# Patient Record
Sex: Female | Born: 1990 | Race: White | Hispanic: No | Marital: Married | Smoking: Never smoker
Health system: Southern US, Community
[De-identification: ages and names within clinical notes are randomized; demographics above are authoritative.]

## PROBLEM LIST (undated history)

## (undated) ENCOUNTER — Inpatient Hospital Stay (HOSPITAL_COMMUNITY): Payer: Self-pay

## (undated) DIAGNOSIS — K5900 Constipation, unspecified: Secondary | ICD-10-CM

## (undated) DIAGNOSIS — Z872 Personal history of diseases of the skin and subcutaneous tissue: Secondary | ICD-10-CM

## (undated) DIAGNOSIS — I959 Hypotension, unspecified: Secondary | ICD-10-CM

## (undated) DIAGNOSIS — R51 Headache: Secondary | ICD-10-CM

## (undated) DIAGNOSIS — K219 Gastro-esophageal reflux disease without esophagitis: Secondary | ICD-10-CM

## (undated) DIAGNOSIS — E282 Polycystic ovarian syndrome: Secondary | ICD-10-CM

## (undated) DIAGNOSIS — Z8619 Personal history of other infectious and parasitic diseases: Secondary | ICD-10-CM

## (undated) DIAGNOSIS — D649 Anemia, unspecified: Secondary | ICD-10-CM

## (undated) DIAGNOSIS — K859 Acute pancreatitis without necrosis or infection, unspecified: Secondary | ICD-10-CM

## (undated) DIAGNOSIS — J189 Pneumonia, unspecified organism: Secondary | ICD-10-CM

## (undated) DIAGNOSIS — Z302 Encounter for sterilization: Secondary | ICD-10-CM

## (undated) DIAGNOSIS — Z9889 Other specified postprocedural states: Secondary | ICD-10-CM

## (undated) DIAGNOSIS — J45909 Unspecified asthma, uncomplicated: Secondary | ICD-10-CM

## (undated) DIAGNOSIS — T4145XA Adverse effect of unspecified anesthetic, initial encounter: Secondary | ICD-10-CM

## (undated) DIAGNOSIS — R112 Nausea with vomiting, unspecified: Secondary | ICD-10-CM

## (undated) DIAGNOSIS — T8859XA Other complications of anesthesia, initial encounter: Secondary | ICD-10-CM

## (undated) HISTORY — DX: Personal history of other infectious and parasitic diseases: Z86.19

## (undated) HISTORY — PX: WISDOM TOOTH EXTRACTION: SHX21

## (undated) HISTORY — DX: Constipation, unspecified: K59.00

---
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
## 2012-03-09 LAB — OB RESULTS CONSOLE ABO/RH: RH Type: POSITIVE

## 2012-03-09 LAB — OB RESULTS CONSOLE GC/CHLAMYDIA: Gonorrhea: NEGATIVE

## 2012-03-09 LAB — OB RESULTS CONSOLE HIV ANTIBODY (ROUTINE TESTING): HIV: NONREACTIVE

## 2012-05-04 LAB — OB RESULTS CONSOLE GC/CHLAMYDIA
Chlamydia: NEGATIVE
Gonorrhea: NEGATIVE

## 2012-08-21 ENCOUNTER — Encounter (HOSPITAL_COMMUNITY): Payer: Self-pay

## 2012-08-21 ENCOUNTER — Inpatient Hospital Stay (HOSPITAL_COMMUNITY)
Admission: AD | Admit: 2012-08-21 | Discharge: 2012-08-21 | Disposition: A | Discharge status: Home / Self Care | Payer: BC Managed Care – PPO | Source: Ambulatory Visit | Attending: Obstetrics and Gynecology | Admitting: Obstetrics and Gynecology

## 2012-08-21 DIAGNOSIS — O479 False labor, unspecified: Secondary | ICD-10-CM

## 2012-08-21 DIAGNOSIS — O4700 False labor before 37 completed weeks of gestation, unspecified trimester: Secondary | ICD-10-CM | POA: Insufficient documentation

## 2012-08-21 MED ORDER — TERBUTALINE SULFATE 1 MG/ML IJ SOLN
0.2500 mg | Freq: Once | INTRAMUSCULAR | Status: AC
  Administered 2012-08-21: 0.25 mg via SUBCUTANEOUS

## 2012-08-21 MED ORDER — TERBUTALINE SULFATE 1 MG/ML IJ SOLN
INTRAMUSCULAR | Status: AC
  Filled 2012-08-21: qty 1

## 2012-08-21 NOTE — MAU Provider Note (Signed)
  History     CSN: [id number]  Arrival date and time: 08/21/12 1635   First Provider Initiated Contact with Patient 08/21/12 1722      Chief Complaint  Patient presents with  . Contractions   HPI 21 y.o. G1P0 at [redacted]w[redacted]d with contractions since 12:30 this afternoon, q 2- 3 minutes, about 60 seconds each. Cervix closed in office, sent for observation.     History reviewed. No pertinent past medical history.  History reviewed. No pertinent past surgical history.  History reviewed. No pertinent family history.  History  Substance Use Topics  . Smoking status: Never Smoker   . Smokeless tobacco: Not on file  . Alcohol Use: No    Allergies:  Allergies  Allergen Reactions  . Peanut-Containing Drug Products Hives  . Red Dye Nausea And Vomiting    Red food coloring    Prescriptions prior to admission  Medication Sig Dispense Refill  . brompheniramine-pseudoephedrine-dextromethorphan (DIMETAPP DM) 15-1-5 MG/5ML ELIX Take 10 mLs by mouth every 6 (six) hours as needed. For congestion/cough      . Prenatal Vit-Fe Fumarate-FA (PRENATAL MULTIVITAMIN) TABS Take 1 tablet by mouth at bedtime.        Review of Systems  Constitutional: Negative.   Respiratory: Negative.   Cardiovascular: Negative.   Gastrointestinal: Negative for nausea, vomiting, abdominal pain, diarrhea and constipation.  Genitourinary: Negative for dysuria, urgency, frequency, hematuria and flank pain.       Negative for vaginal bleeding, Positive cramping/contractions  Musculoskeletal: Negative.   Neurological: Negative.   Psychiatric/Behavioral: Negative.    Physical Exam   Blood pressure 111/75, pulse 91, temperature 97.1 F (36.2 C), temperature source Oral, resp. rate 18, height 5\' 3"  (1.6 m), weight 174 lb 8 oz (79.153 kg).  Physical Exam  Nursing note and vitals reviewed. Constitutional: She is oriented to person, place, and time. She appears well-developed and well-nourished. No distress.    Cardiovascular: Normal rate.   Respiratory: Effort normal.  GI: Soft. There is no tenderness.  Musculoskeletal: Normal range of motion.  Neurological: She is alert and oriented to person, place, and time.  Skin: Skin is warm and dry.  Psychiatric: She has a normal mood and affect.   EFM reactive, TOCO: irreg, irritability MAU Course  Procedures Meds ordered this encounter  Medications  . terbutaline (BRETHINE) injection 0.25 mg    Sig:   . terbutaline (BRETHINE) 1 MG/ML injection    Sig:     BENNETT, PEACE I: cabinet override  . Prenatal Vit-Fe Fumarate-FA (PRENATAL MULTIVITAMIN) TABS    Sig: Take 1 tablet by mouth at bedtime.  . brompheniramine-pseudoephedrine-dextromethorphan (DIMETAPP DM) 15-1-5 MG/5ML ELIX    Sig: Take 10 mLs by mouth every 6 (six) hours as needed. For congestion/cough     Assessment and Plan   1. Preterm contractions       Medication List     As of 08/21/2012  7:00 PM    CONTINUE taking these medications         brompheniramine-pseudoephedrine-dextromethorphan 15-1-5 MG/5ML Elix   Commonly known as: DIMETAPP DM      prenatal multivitamin Tabs            Follow-up Information    Follow up with Bing Plume, MD. (as scheduled)    Contact information:   [employer] [employer] [address], [address] [place] [address] [phone]            Mataio Mele 08/21/2012, 7:00 PM

## 2012-08-21 NOTE — MAU Note (Signed)
Patient is sent from the office for further monitoring. She states that she was having contractions q2-25mins without cervical dilatation. She reports good fetal movement. Denies lof.

## 2012-08-21 NOTE — Progress Notes (Signed)
Natalie frazier cnm notified of sve

## 2012-08-27 ENCOUNTER — Encounter (HOSPITAL_COMMUNITY): Payer: Self-pay | Admitting: *Deleted

## 2012-08-27 ENCOUNTER — Inpatient Hospital Stay (HOSPITAL_COMMUNITY)
Admission: AD | Admit: 2012-08-27 | Discharge: 2012-08-27 | Disposition: A | Discharge status: Hospice | Payer: BC Managed Care – PPO | Source: Ambulatory Visit | Attending: Obstetrics and Gynecology | Admitting: Obstetrics and Gynecology

## 2012-08-27 DIAGNOSIS — O479 False labor, unspecified: Secondary | ICD-10-CM

## 2012-08-27 DIAGNOSIS — O4700 False labor before 37 completed weeks of gestation, unspecified trimester: Secondary | ICD-10-CM | POA: Insufficient documentation

## 2012-08-27 HISTORY — DX: Headache: R51

## 2012-08-27 MED ORDER — TERBUTALINE SULFATE 1 MG/ML IJ SOLN
0.2500 mg | Freq: Once | INTRAMUSCULAR | Status: AC
  Administered 2012-08-27: 0.25 mg via SUBCUTANEOUS

## 2012-08-27 MED ORDER — TERBUTALINE SULFATE 1 MG/ML IJ SOLN
INTRAMUSCULAR | Status: AC
  Administered 2012-08-27: 0.25 mg via SUBCUTANEOUS
  Filled 2012-08-27: qty 1

## 2012-08-27 NOTE — MAU Note (Signed)
Patient states she was seen in MAU on 10-7 and had one dose Terbutaline. Is having contractions again every 2 minutes. Denies any leaking or bleeding. Reports good fetal movement.

## 2012-08-27 NOTE — MAU Note (Signed)
Pt says she experienced ctxs throughout the night and this morning they became stronger.  Also says something feels like it is running back toward my butt while I sit here.  Denies any bleeding.

## 2012-08-27 NOTE — MAU Provider Note (Signed)
  History     CSN: [id number]  Arrival date and time: 08/27/12 1437   First Provider Initiated Contact with Patient 08/27/12 1549      Chief Complaint  Patient presents with  . Labor Eval   HPI 21 y.o. G1P0 at [redacted]w[redacted]d c/o contractions, seen in MAU last Monday with same c/o, cervix closed and long, stopped with terbutaline, states cervix was "thinning" on Friday, contractions worse this morning. No bleeding. ? LOF - states she feels increased discharge since arrival to MAU.    Past Medical History  Diagnosis Date  . Headache     Past Surgical History  Procedure Date  . Wisdom tooth extraction     History reviewed. No pertinent family history.  History  Substance Use Topics  . Smoking status: Never Smoker   . Smokeless tobacco: Not on file  . Alcohol Use: No    Allergies:  Allergies  Allergen Reactions  . Peanut-Containing Drug Products Hives  . Red Dye Nausea And Vomiting    Red food coloring    Prescriptions prior to admission  Medication Sig Dispense Refill  . brompheniramine-pseudoephedrine-dextromethorphan (DIMETAPP DM) 15-1-5 MG/5ML ELIX Take 10 mLs by mouth every 6 (six) hours as needed. For congestion/cough      . Prenatal Vit-Fe Fumarate-FA (PRENATAL MULTIVITAMIN) TABS Take 1 tablet by mouth at bedtime.        Review of Systems  Constitutional: Negative.   Respiratory: Negative.   Cardiovascular: Negative.   Gastrointestinal: Negative for nausea, vomiting, abdominal pain, diarrhea and constipation.  Genitourinary: Negative for dysuria, urgency, frequency, hematuria and flank pain.       Negative for vaginal bleeding, Positive  cramping/contractions  Musculoskeletal: Negative.   Neurological: Negative.   Psychiatric/Behavioral: Negative.    Physical Exam   Blood pressure 124/82, pulse 94, temperature 98.3 F (36.8 C), temperature source Oral, resp. rate 18, SpO2 100.00%.  Physical Exam  Nursing note reviewed. Constitutional: She is oriented to  person, place, and time. She appears well-developed and well-nourished. No distress.  Cardiovascular: Normal rate.   Respiratory: Effort normal.  GI: Soft. There is no tenderness.  Genitourinary: Vaginal discharge (mucous, no pooling) found.       Dilation: Closed Effacement (%): 50 Cervical Position: Middle Exam by:: N. Haille Pardi CNM   Musculoskeletal: Normal range of motion.  Neurological: She is alert and oriented to person, place, and time.  Skin: Skin is warm and dry.  Psychiatric: She has a normal mood and affect.   EFM reactive TOCO: q 2-3 min MAU Course  Procedures  Results for orders placed during the hospital encounter of 08/27/12 (from the past 24 hour(s))  POCT FERN TEST     Status: Normal   Collection Time   08/27/12  3:57 PM      Component Value Range   Fern Test Negative        . terbutaline  0.25 mg Subcutaneous Once     Assessment and Plan  21 y.o. G1P0 at [redacted]w[redacted]d Preterm contractions - no evidence of labor D/C home with precautions, f/u as scheduled  Laree Garron 08/27/2012, 4:13 PM

## 2012-08-29 LAB — OB RESULTS CONSOLE GBS: GBS: NEGATIVE

## 2012-09-16 ENCOUNTER — Encounter (HOSPITAL_COMMUNITY): Payer: Self-pay

## 2012-09-16 ENCOUNTER — Inpatient Hospital Stay (HOSPITAL_COMMUNITY)
Admission: AD | Admit: 2012-09-16 | Discharge: 2012-09-16 | Disposition: A | Discharge status: Home / Self Care | Payer: BC Managed Care – PPO | Source: Ambulatory Visit | Attending: Obstetrics and Gynecology | Admitting: Obstetrics and Gynecology

## 2012-09-16 DIAGNOSIS — O99891 Other specified diseases and conditions complicating pregnancy: Secondary | ICD-10-CM | POA: Insufficient documentation

## 2012-09-16 NOTE — MAU Note (Signed)
Leaking some fld last night and thought I was just peeing on myself. Today have leaked more fld and panties are staying wet.

## 2012-09-16 NOTE — H&P (Signed)
Valerie Cortez is a 21 y.o. female G1P0 at 35 1/7 weeks (EDD 11/30/11 by 10 week [place]) presenting for possible ROM since yesterday PM. No significant ctx, but has noted intermittent leakage since late last night.  Prenatal care complicated by chlamydia infection in the first trimester which was treated with a negative TOC, however pt tested positive again 08/25/12 and was treated again and told to abstain from sex until after delivery.  No other significant issues.  History OB History    Grav Para Term Preterm Abortions TAB SAB Ect Mult Living   1              Past Medical History  Diagnosis Date  . Headache    Past Surgical History  Procedure Date  . Wisdom tooth extraction    Family History: family history is not on file. Social History:  reports that she has never smoked. She does not have any smokeless tobacco history on file. She reports that she does not drink alcohol or use illicit drugs.   Prenatal Transfer Tool  Maternal Diabetes: No Genetic Screening: Declined Maternal Ultrasounds/Referrals: Normal Fetal Ultrasounds or other Referrals:  None Maternal Substance Abuse:  No Significant Maternal Medications:  None Significant Maternal Lab Results:  Lab values include: Other: + chlamydia 08/25/12-treated Other Comments:  None  ROS    Blood pressure 124/80, pulse 101, temperature 97.7 F (36.5 C), temperature source Oral, resp. rate 20, height 5\' 2"  (1.575 m), weight 80.74 kg (178 lb). Maternal Exam:  Uterine Assessment: Contraction strength is mild.  Contraction frequency is irregular.   Abdomen: Patient reports no abdominal tenderness. Introitus: Normal vulva. Normal vagina.    Physical Exam  Constitutional: She appears well-developed and well-nourished.  Cardiovascular: Normal rate and regular rhythm.   Respiratory: Effort normal and breath sounds normal.  GI: Soft.  Genitourinary: Vagina normal and uterus normal.  Psychiatric: She has a normal mood and affect. Her  behavior is normal.    Prenatal labs: ABO, Rh:  AB positive Antibody:  negative Rubella:  Immune RPR:   Negative  HBsAg:   Negative HIV:   NR GBS:   Negative One hour GTT 129 Declined genetics  Assessment/Plan: Pt for assessment of possible ROM  PT seen in MAU and ROM ruled out.  Will be discharged to home.  Oliver Pila 09/16/2012, 10:53 PM

## 2012-09-16 NOTE — MAU Note (Signed)
Notified Dr. Senaida Ores patient here for r/o rupture spec exam negative fern, fhr reactive, occasional contractions.

## 2012-09-24 ENCOUNTER — Inpatient Hospital Stay (HOSPITAL_COMMUNITY)
Admission: AD | Admit: 2012-09-24 | Discharge: 2012-09-25 | Disposition: A | Discharge status: Hospice | Payer: BC Managed Care – PPO | Source: Ambulatory Visit | Attending: Obstetrics and Gynecology | Admitting: Obstetrics and Gynecology

## 2012-09-24 ENCOUNTER — Inpatient Hospital Stay (HOSPITAL_COMMUNITY): Payer: BC Managed Care – PPO

## 2012-09-24 DIAGNOSIS — O479 False labor, unspecified: Secondary | ICD-10-CM | POA: Insufficient documentation

## 2012-09-24 DIAGNOSIS — O26899 Other specified pregnancy related conditions, unspecified trimester: Secondary | ICD-10-CM

## 2012-09-24 DIAGNOSIS — O99891 Other specified diseases and conditions complicating pregnancy: Secondary | ICD-10-CM | POA: Insufficient documentation

## 2012-09-24 DIAGNOSIS — N898 Other specified noninflammatory disorders of vagina: Secondary | ICD-10-CM

## 2012-09-24 DIAGNOSIS — R55 Syncope and collapse: Secondary | ICD-10-CM

## 2012-09-24 NOTE — MAU Provider Note (Signed)
Chief Complaint:  Rupture of Membranes and Contractions   First Provider Initiated Contact with Patient 09/24/12 2308     HPI: Valerie Cortez is a 21 y.o. G1P0 at [redacted]w[redacted]d who presents to maternity admissions reporting leaking a small amount of clear fluid an down her leg at 2010 this evening, small amount of continued leaking at home. None since. Mild UC's.  Denies vaginal bleeding. Good fetal movement. Pt requesting IOL if not ruptured due to feeling as is she is going to pass out prom the pressure of the baby low in her pelvis and from contractions. States she has a Hx of syncope or near-syncope from hypotension, this feels similar, but has worsened late in pregnancy.   Past Medical History: Past Medical History  Diagnosis Date  . Headache     Past obstetric history: OB History    Grav Para Term Preterm Abortions TAB SAB Ect Mult Living   1              # Outc Date GA Lbr Len/2nd Wgt Sex Del Anes PTL Lv   1 CUR               Past Surgical History: Past Surgical History  Procedure Date  . Wisdom tooth extraction     Family History: No family history on file.  Social History: History  Substance Use Topics  . Smoking status: Never Smoker   . Smokeless tobacco: Not on file  . Alcohol Use: No    Allergies:  Allergies  Allergen Reactions  . Peanut-Containing Drug Products Hives  . Red Dye Nausea And Vomiting    Red food coloring    Meds:  Prescriptions prior to admission  Medication Sig Dispense Refill  . albuterol (PROVENTIL HFA;VENTOLIN HFA) 108 (90 BASE) MCG/ACT inhaler Inhale 2 puffs into the lungs every 6 (six) hours as needed. For shortness of breath      . Prenatal Vit-Fe Fumarate-FA (PRENATAL MULTIVITAMIN) TABS Take 1 tablet by mouth at bedtime.        ROS: Pertinent findings in history of present illness.  Physical Exam  Blood pressure 131/78, pulse 85, resp. rate 18, height 5\' 2"  (1.575 m), weight 80.74 kg (178 lb), SpO2 100.00%. GENERAL: Well-developed,  well-nourished female in no acute distress.  HEENT: normocephalic HEART: normal rate RESP: normal effort ABDOMEN: Soft, non-tender, gravid appropriate for gestational age EXTREMITIES: Nontender, 3+ edema NEURO: alert and oriented SPECULUM EXAM: NEFG, moderate amount of thin, white discharge and mucus. Neg pool, no blood, cervix clean Dilation: Closed Effacement (%): 40 Cervical Position: Posterior Station: -2 Exam by:: K. WeissRN  FHT:  Baseline 130 , moderate variability, accelerations present, no decelerations Contractions: q 2-6 mins, mild   Labs: Results for orders placed during the hospital encounter of 09/24/12 (from the past 24 hour(s))  POCT FERN TEST     Status: Normal   Collection Time   09/24/12 10:04 PM      Component Value Range   POCT Fern Test Negative = intact amniotic membranes      Imaging:  No results found. MAU Course: US Ob Limited  09/25/2012  *RADIOLOGY REPORT*  Clinical Data: Amniotic fluid index  LIMITED OBSTETRIC ULTRASOUND  Number of Fetuses: 1 Heart Rate: 147 bpm Movement: Present Presentation: Cephalic Placental Location: Anterior Previa: Absent Amniotic Fluid (Subjective): Normal  AFI: 15.46 cm (5%ile 7.2 cm, 95%ile 22.6 cm)  BPD: 9.56cm   39w   1d  MATERNAL FINDINGS: Cervix: N/A Uterus/Adnexae: Bilateral ovaries visualized  and unremarkable.  IMPRESSION: Amniotic fluid index within normal limits.  Recommend followup with non-emergent complete OB 14+ wk US examination for fetal biometric evaluation and anatomic survey if not already performed.   Original Report Authenticated By: Charlett Nose, M.D.     Assessment: 1. Vaginal discharge in pregnancy   2. Near syncope    Plan: Discharge home Labor precautions and fetal kick counts Dr. Ambrose Mantle notified about pt concerns about near-syncope and Hx of same. Not indication for IOL.  Syncope precautions reviewed.   Follow up with Dr. Ambrose Mantle as scheduled or MAU as needed.   Medication List     As of  09/29/2012 12:09 AM    ASK your doctor about these medications         albuterol 108 (90 BASE) MCG/ACT inhaler   Commonly known as: PROVENTIL HFA;VENTOLIN HFA   Inhale 2 puffs into the lungs every 6 (six) hours as needed. For shortness of breath      prenatal multivitamin Tabs   Take 1 tablet by mouth at bedtime.         [place], CNM 09/24/2012 11:04 PM

## 2012-09-24 NOTE — MAU Note (Signed)
Pt reports rupture of membranes at 2010, clear fluid. Contractions.

## 2012-10-02 ENCOUNTER — Encounter (HOSPITAL_COMMUNITY): Payer: Self-pay | Admitting: *Deleted

## 2012-10-02 ENCOUNTER — Telehealth (HOSPITAL_COMMUNITY): Payer: Self-pay | Admitting: *Deleted

## 2012-10-02 NOTE — Telephone Encounter (Signed)
Preadmission screen  

## 2012-10-03 ENCOUNTER — Inpatient Hospital Stay (HOSPITAL_COMMUNITY)
Admission: AD | Admit: 2012-10-03 | Discharge: 2012-10-06 | DRG: 371 | Disposition: A | Discharge status: Home / Self Care | Payer: BC Managed Care – PPO | Source: Ambulatory Visit | Attending: Obstetrics and Gynecology | Admitting: Obstetrics and Gynecology

## 2012-10-03 ENCOUNTER — Other Ambulatory Visit: Payer: Self-pay | Admitting: Obstetrics and Gynecology

## 2012-10-03 ENCOUNTER — Encounter (HOSPITAL_COMMUNITY): Payer: Self-pay | Admitting: *Deleted

## 2012-10-03 LAB — CBC
HCT: 35.4 % — ABNORMAL LOW (ref 36.0–46.0)
MCH: 27.1 pg (ref 26.0–34.0)
MCV: 81.9 fL (ref 78.0–100.0)
Platelets: 209 10*3/uL (ref 150–400)
RBC: 4.32 MIL/uL (ref 3.87–5.11)
RDW: 14.9 % (ref 11.5–15.5)

## 2012-10-03 MED ORDER — LACTATED RINGERS IV SOLN
500.0000 mL | INTRAVENOUS | Status: DC | PRN
  Administered 2012-10-04: 500 mL via INTRAVENOUS

## 2012-10-03 MED ORDER — OXYTOCIN 40 UNITS IN LACTATED RINGERS INFUSION - SIMPLE MED
1.0000 m[IU]/min | INTRAVENOUS | Status: DC
  Administered 2012-10-03: 1 m[IU]/min via INTRAVENOUS

## 2012-10-03 MED ORDER — MISOPROSTOL 25 MCG QUARTER TABLET: 25.0000 ug | ORAL_TABLET | ORAL | Status: DC

## 2012-10-03 MED ORDER — IBUPROFEN 600 MG PO TABS: 600.0000 mg | ORAL_TABLET | Freq: Four times a day (QID) | ORAL | Status: DC | PRN

## 2012-10-03 MED ORDER — LACTATED RINGERS IV SOLN
INTRAVENOUS | Status: DC
  Administered 2012-10-03 – 2012-10-04 (×4): via INTRAVENOUS

## 2012-10-03 MED ORDER — LIDOCAINE HCL (PF) 1 % IJ SOLN
30.0000 mL | INTRAMUSCULAR | Status: DC | PRN
  Filled 2012-10-03: qty 30

## 2012-10-03 MED ORDER — OXYCODONE-ACETAMINOPHEN 5-325 MG PO TABS: 1.0000 | ORAL_TABLET | ORAL | Status: DC | PRN

## 2012-10-03 MED ORDER — OXYTOCIN 40 UNITS IN LACTATED RINGERS INFUSION - SIMPLE MED: 62.5000 mL/h | INTRAVENOUS | Status: DC

## 2012-10-03 MED ORDER — OXYTOCIN BOLUS FROM INFUSION: 500.0000 mL | INTRAVENOUS | Status: DC

## 2012-10-03 MED ORDER — TERBUTALINE SULFATE 1 MG/ML IJ SOLN: 0.2500 mg | Freq: Once | INTRAMUSCULAR | Status: AC | PRN

## 2012-10-03 NOTE — H&P (Signed)
NAME:  Valerie Cortez, Valerie Cortez                    ACCOUNT NO.:  [id number]  MEDICAL RECORD NO.:  [id number]  LOCATION:                                 FACILITY:  PHYSICIAN:  Malachi Pro. Ambrose Mantle, M.D. DATE OF BIRTH:  1990/11/17  DATE OF ADMISSION:  10/03/2012 DATE OF DISCHARGE:                             HISTORY & PHYSICAL   PRESENT ILLNESS:  This is a 21 year old, white female, para 0, gravida 1, last period on November 27, 2011, [facility] by dates of September 02, 2012, but [facility] is on September 29, 2012, by an ultrasound at 10 weeks and 6 days on March 09, 2012.  Blood group and type, AB positive.  Negative antibody.  Pap smear normal.  Rubella immune.  RPR nonreactive.  Urine culture negative.  Hepatitis B surface antigen negative.  HIV negative. GC was negative, Chlamydia was positive on March 09, 2012.  The test of cure on May 04, 2012, was negative and an ultrasound on June 05, 2012, was normal.  1-hour glucose was 129.  Group B strep negative.  Repeat HIV and RPR were negative.  GC was negative on August 25, 2012, positive for Chlamydia on August 25, 2012.  The patient was again treated with azithromycin and was advised not have sex until after the baby came, so there would be no infection at the time of delivery.  Also needed to have the partner treated, otherwise her prenatal course, seems to have been relatively uncomplicated.  She unfortunately has remained with a closed cervix including on the day of admission on October 03, 2012, when her cervix is still closed about 30% effaced, vertex at a -3 station.  PAST MEDICAL HISTORY:  Reveals no known allergies to drugs, to latex, but she did get hives with peanuts at age 45.  SURGICAL HISTORY:  Her only surgery is having wisdom teeth extracted.  MEDICAL HISTORY:  She has had a history of migraines with vision loss two years ago.  FAMILY HISTORY:  Her brother has asthma.  Mother has cerebral palsy as well as diverticulitis and she did have  hysterectomy for fibroids. Mother also has migraines maternal grandmother had ovarian cancer. Paternal grandfather, lung cancer.  Maternal grandfather, high blood pressure, heart valve abnormality, diabetes, and COPD.  SOCIAL HISTORY:  The patient works as a Associate Professor full time.  She went to [employer] school and graduated in 2010.  She denies tobacco, alcohol, or illicit substance abuse.  PHYSICAL EXAMINATION:  GENERAL:  A well-developed, well-nourished, white female, in no distress. VITAL SIGNS:  Blood pressure is 108/70 and pulse is 80. HEART:  Normal size and sounds.  No murmurs. LUNGS:  Clear to auscultation. GU:  Fundal height is 39 cm.  Fetal heart tones are normal.  Cervix is closed 30%, vertex at -3.  ADMITTING IMPRESSION:  Intrauterine pregnancy at 40 weeks and 4 days. The patient is admitted for induction beginning with ripening by Cytotec.  She has not had a test of cure for the Chlamydia at the second time because the organisms could be present during the short time after treatment and will show up on the test as positive even if they  are dead.  The nursery will be notified that the patient has had a positive Chlamydia checks during pregnancy.     Malachi Pro. Ambrose Mantle, M.D.     TFH/MEDQ  D:  10/03/2012  T:  10/03/2012  Job:  [id number]

## 2012-10-04 ENCOUNTER — Inpatient Hospital Stay (HOSPITAL_COMMUNITY): Payer: BC Managed Care – PPO | Admitting: Anesthesiology

## 2012-10-04 ENCOUNTER — Encounter (HOSPITAL_COMMUNITY): Payer: Self-pay | Admitting: Anesthesiology

## 2012-10-04 ENCOUNTER — Encounter (HOSPITAL_COMMUNITY): Payer: Self-pay | Admitting: General Surgery

## 2012-10-04 ENCOUNTER — Encounter (HOSPITAL_COMMUNITY)
Admission: AD | Disposition: A | Discharge status: Home / Self Care | Payer: Self-pay | Source: Ambulatory Visit | Attending: Obstetrics and Gynecology

## 2012-10-04 SURGERY — Surgical Case
Anesthesia: Epidural

## 2012-10-04 MED ORDER — MEPERIDINE HCL 25 MG/ML IJ SOLN
INTRAMUSCULAR | Status: DC | PRN
  Administered 2012-10-04: 12.5 mg via INTRAVENOUS

## 2012-10-04 MED ORDER — PHENYLEPHRINE 40 MCG/ML (10ML) SYRINGE FOR IV PUSH (FOR BLOOD PRESSURE SUPPORT): 80.0000 ug | PREFILLED_SYRINGE | INTRAVENOUS | Status: DC | PRN

## 2012-10-04 MED ORDER — SODIUM CHLORIDE 0.9 % IJ SOLN: 3.0000 mL | INTRAMUSCULAR | Status: DC | PRN

## 2012-10-04 MED ORDER — LACTATED RINGERS IV SOLN
INTRAVENOUS | Status: DC
  Administered 2012-10-04: 22:00:00 via INTRAVENOUS

## 2012-10-04 MED ORDER — METOCLOPRAMIDE HCL 5 MG/ML IJ SOLN
INTRAMUSCULAR | Status: AC
  Filled 2012-10-04: qty 2

## 2012-10-04 MED ORDER — PHENYLEPHRINE 40 MCG/ML (10ML) SYRINGE FOR IV PUSH (FOR BLOOD PRESSURE SUPPORT)
PREFILLED_SYRINGE | INTRAVENOUS | Status: AC
  Filled 2012-10-04: qty 5

## 2012-10-04 MED ORDER — DIPHENHYDRAMINE HCL 50 MG/ML IJ SOLN: 25.0000 mg | INTRAMUSCULAR | Status: DC | PRN

## 2012-10-04 MED ORDER — WITCH HAZEL-GLYCERIN EX PADS: 1.0000 "application " | MEDICATED_PAD | CUTANEOUS | Status: DC | PRN

## 2012-10-04 MED ORDER — ONDANSETRON HCL 4 MG/2ML IJ SOLN
INTRAMUSCULAR | Status: DC | PRN
  Administered 2012-10-04: 4 mg via INTRAVENOUS

## 2012-10-04 MED ORDER — NALBUPHINE SYRINGE 5 MG/0.5 ML
5.0000 mg | INJECTION | INTRAMUSCULAR | Status: DC | PRN
  Filled 2012-10-04: qty 1

## 2012-10-04 MED ORDER — KETOROLAC TROMETHAMINE 60 MG/2ML IM SOLN
INTRAMUSCULAR | Status: AC
  Administered 2012-10-04: 60 mg via INTRAMUSCULAR
  Filled 2012-10-04: qty 2

## 2012-10-04 MED ORDER — LIDOCAINE-EPINEPHRINE (PF) 2 %-1:200000 IJ SOLN
INTRAMUSCULAR | Status: AC
  Filled 2012-10-04: qty 20

## 2012-10-04 MED ORDER — OXYCODONE-ACETAMINOPHEN 5-325 MG PO TABS
1.0000 | ORAL_TABLET | ORAL | Status: DC | PRN
  Administered 2012-10-05: 1 via ORAL
  Filled 2012-10-04: qty 1

## 2012-10-04 MED ORDER — OXYTOCIN 10 UNIT/ML IJ SOLN
INTRAMUSCULAR | Status: AC
  Filled 2012-10-04: qty 4

## 2012-10-04 MED ORDER — ONDANSETRON HCL 4 MG PO TABS
4.0000 mg | ORAL_TABLET | ORAL | Status: DC | PRN
  Administered 2012-10-05 (×2): 4 mg via ORAL
  Filled 2012-10-04 (×2): qty 1

## 2012-10-04 MED ORDER — FENTANYL 2.5 MCG/ML BUPIVACAINE 1/10 % EPIDURAL INFUSION (WH - ANES)
14.0000 mL/h | INTRAMUSCULAR | Status: DC
  Filled 2012-10-04: qty 125

## 2012-10-04 MED ORDER — EPHEDRINE SULFATE 50 MG/ML IJ SOLN
INTRAMUSCULAR | Status: DC | PRN
Start: 2012-10-04 — End: 2012-10-04
  Administered 2012-10-04: 5 mg via INTRAVENOUS
  Administered 2012-10-04: 10 mg via INTRAVENOUS
  Administered 2012-10-04: 5 mg via INTRAVENOUS

## 2012-10-04 MED ORDER — KETOROLAC TROMETHAMINE 60 MG/2ML IM SOLN
60.0000 mg | Freq: Once | INTRAMUSCULAR | Status: AC | PRN
  Administered 2012-10-04: 60 mg via INTRAMUSCULAR

## 2012-10-04 MED ORDER — SODIUM BICARBONATE 8.4 % IV SOLN
INTRAVENOUS | Status: AC
  Filled 2012-10-04: qty 50

## 2012-10-04 MED ORDER — SIMETHICONE 80 MG PO CHEW
80.0000 mg | CHEWABLE_TABLET | Freq: Three times a day (TID) | ORAL | Status: DC
  Administered 2012-10-05 – 2012-10-06 (×6): 80 mg via ORAL

## 2012-10-04 MED ORDER — FENTANYL 2.5 MCG/ML BUPIVACAINE 1/10 % EPIDURAL INFUSION (WH - ANES)
INTRAMUSCULAR | Status: DC | PRN
  Administered 2012-10-04: 14 mL/h via EPIDURAL

## 2012-10-04 MED ORDER — BUTORPHANOL TARTRATE 1 MG/ML IJ SOLN
2.0000 mg | Freq: Once | INTRAMUSCULAR | Status: AC
  Administered 2012-10-04: 2 mg via INTRAVENOUS
  Filled 2012-10-04: qty 2

## 2012-10-04 MED ORDER — MORPHINE SULFATE 0.5 MG/ML IJ SOLN
INTRAMUSCULAR | Status: AC
  Filled 2012-10-04: qty 10

## 2012-10-04 MED ORDER — HYDROMORPHONE HCL PF 1 MG/ML IJ SOLN: 0.2500 mg | INTRAMUSCULAR | Status: DC | PRN

## 2012-10-04 MED ORDER — OXYTOCIN 40 UNITS IN LACTATED RINGERS INFUSION - SIMPLE MED: 1.0000 m[IU]/min | INTRAVENOUS | Status: DC

## 2012-10-04 MED ORDER — DIBUCAINE 1 % RE OINT: 1.0000 "application " | TOPICAL_OINTMENT | RECTAL | Status: DC | PRN

## 2012-10-04 MED ORDER — LIDOCAINE HCL (PF) 1 % IJ SOLN
INTRAMUSCULAR | Status: DC | PRN
  Administered 2012-10-04 (×2): 8 mL

## 2012-10-04 MED ORDER — ALBUTEROL SULFATE HFA 108 (90 BASE) MCG/ACT IN AERS: 2.0000 | INHALATION_SPRAY | Freq: Four times a day (QID) | RESPIRATORY_TRACT | Status: DC | PRN

## 2012-10-04 MED ORDER — MORPHINE SULFATE (PF) 0.5 MG/ML IJ SOLN
INTRAMUSCULAR | Status: DC | PRN
  Administered 2012-10-04: 4 mg via EPIDURAL

## 2012-10-04 MED ORDER — KETOROLAC TROMETHAMINE 30 MG/ML IJ SOLN: 30.0000 mg | Freq: Four times a day (QID) | INTRAMUSCULAR | Status: AC | PRN

## 2012-10-04 MED ORDER — MEPERIDINE HCL 25 MG/ML IJ SOLN: 6.2500 mg | INTRAMUSCULAR | Status: DC | PRN

## 2012-10-04 MED ORDER — LACTATED RINGERS IV SOLN
INTRAVENOUS | Status: DC | PRN
  Administered 2012-10-04 (×2): via INTRAVENOUS

## 2012-10-04 MED ORDER — SIMETHICONE 80 MG PO CHEW: 80.0000 mg | CHEWABLE_TABLET | ORAL | Status: DC | PRN

## 2012-10-04 MED ORDER — KETOROLAC TROMETHAMINE 30 MG/ML IJ SOLN: 15.0000 mg | Freq: Once | INTRAMUSCULAR | Status: DC | PRN

## 2012-10-04 MED ORDER — LACTATED RINGERS IV SOLN
INTRAVENOUS | Status: DC | PRN
  Administered 2012-10-04: 13:00:00 via INTRAVENOUS

## 2012-10-04 MED ORDER — MEASLES, MUMPS & RUBELLA VAC ~~LOC~~ INJ: 0.5000 mL | INJECTION | Freq: Once | SUBCUTANEOUS | Status: DC

## 2012-10-04 MED ORDER — ONDANSETRON HCL 4 MG/2ML IJ SOLN
4.0000 mg | INTRAMUSCULAR | Status: DC | PRN
  Administered 2012-10-04: 4 mg via INTRAVENOUS
  Filled 2012-10-04: qty 2

## 2012-10-04 MED ORDER — CEFAZOLIN SODIUM 1-5 GM-% IV SOLN
1.0000 g | Freq: Three times a day (TID) | INTRAVENOUS | Status: AC
  Administered 2012-10-05: 1 g via INTRAVENOUS
  Filled 2012-10-04 (×2): qty 50

## 2012-10-04 MED ORDER — DIPHENHYDRAMINE HCL 25 MG PO CAPS
25.0000 mg | ORAL_CAPSULE | ORAL | Status: DC | PRN
  Filled 2012-10-04: qty 1

## 2012-10-04 MED ORDER — MEPERIDINE HCL 25 MG/ML IJ SOLN
INTRAMUSCULAR | Status: AC
  Filled 2012-10-04: qty 1

## 2012-10-04 MED ORDER — MENTHOL 3 MG MT LOZG: 1.0000 | LOZENGE | OROMUCOSAL | Status: DC | PRN

## 2012-10-04 MED ORDER — METOCLOPRAMIDE HCL 5 MG/ML IJ SOLN: 10.0000 mg | Freq: Three times a day (TID) | INTRAMUSCULAR | Status: DC | PRN

## 2012-10-04 MED ORDER — CITRIC ACID-SODIUM CITRATE 334-500 MG/5ML PO SOLN
ORAL | Status: AC
  Administered 2012-10-04: 30 mL
  Administered 2012-10-04: 13:00:00
  Filled 2012-10-04: qty 15

## 2012-10-04 MED ORDER — PHENYLEPHRINE 40 MCG/ML (10ML) SYRINGE FOR IV PUSH (FOR BLOOD PRESSURE SUPPORT)
80.0000 ug | PREFILLED_SYRINGE | INTRAVENOUS | Status: DC | PRN
  Filled 2012-10-04: qty 5

## 2012-10-04 MED ORDER — OXYTOCIN 40 UNITS IN LACTATED RINGERS INFUSION - SIMPLE MED: 62.5000 mL/h | INTRAVENOUS | Status: AC

## 2012-10-04 MED ORDER — CEFAZOLIN SODIUM 1-5 GM-% IV SOLN
INTRAVENOUS | Status: DC | PRN
  Administered 2012-10-04: 2 g via INTRAVENOUS

## 2012-10-04 MED ORDER — EPHEDRINE 5 MG/ML INJ
10.0000 mg | INTRAVENOUS | Status: DC | PRN
  Filled 2012-10-04: qty 4

## 2012-10-04 MED ORDER — SENNOSIDES-DOCUSATE SODIUM 8.6-50 MG PO TABS
2.0000 | ORAL_TABLET | Freq: Every day | ORAL | Status: DC
  Administered 2012-10-05: 2 via ORAL

## 2012-10-04 MED ORDER — NALOXONE HCL 0.4 MG/ML IJ SOLN: 0.4000 mg | INTRAMUSCULAR | Status: DC | PRN

## 2012-10-04 MED ORDER — ZOLPIDEM TARTRATE 5 MG PO TABS: 5.0000 mg | ORAL_TABLET | Freq: Every evening | ORAL | Status: DC | PRN

## 2012-10-04 MED ORDER — ONDANSETRON HCL 4 MG/2ML IJ SOLN: 4.0000 mg | Freq: Three times a day (TID) | INTRAMUSCULAR | Status: DC | PRN

## 2012-10-04 MED ORDER — SODIUM BICARBONATE 8.4 % IV SOLN
INTRAVENOUS | Status: DC | PRN
  Administered 2012-10-04 (×2): 5 mL via EPIDURAL

## 2012-10-04 MED ORDER — PRENATAL MULTIVITAMIN CH
1.0000 | ORAL_TABLET | Freq: Every day | ORAL | Status: AC
  Administered 2012-10-05 – 2012-10-06 (×3): 1 via ORAL
  Filled 2012-10-04 (×3): qty 1

## 2012-10-04 MED ORDER — PHENYLEPHRINE HCL 10 MG/ML IJ SOLN
INTRAMUSCULAR | Status: DC | PRN
  Administered 2012-10-04: 120 ug via INTRAVENOUS
  Administered 2012-10-04 (×3): 40 ug via INTRAVENOUS
  Administered 2012-10-04: 80 ug via INTRAVENOUS
  Administered 2012-10-04 (×2): 40 ug via INTRAVENOUS
  Administered 2012-10-04: 80 ug via INTRAVENOUS
  Administered 2012-10-04: 40 ug via INTRAVENOUS

## 2012-10-04 MED ORDER — DIPHENHYDRAMINE HCL 50 MG/ML IJ SOLN: 12.5000 mg | INTRAMUSCULAR | Status: DC | PRN

## 2012-10-04 MED ORDER — LANOLIN HYDROUS EX OINT: 1.0000 "application " | TOPICAL_OINTMENT | CUTANEOUS | Status: DC | PRN

## 2012-10-04 MED ORDER — EPHEDRINE 5 MG/ML INJ: 10.0000 mg | INTRAVENOUS | Status: DC | PRN

## 2012-10-04 MED ORDER — METOCLOPRAMIDE HCL 5 MG/ML IJ SOLN
INTRAMUSCULAR | Status: DC | PRN
  Administered 2012-10-04: 5 mg via INTRAVENOUS

## 2012-10-04 MED ORDER — NALOXONE HCL 1 MG/ML IJ SOLN
1.0000 ug/kg/h | INTRAVENOUS | Status: DC | PRN
  Filled 2012-10-04: qty 2

## 2012-10-04 MED ORDER — LACTATED RINGERS IV SOLN
500.0000 mL | Freq: Once | INTRAVENOUS | Status: AC
  Administered 2012-10-04: 500 mL via INTRAVENOUS

## 2012-10-04 MED ORDER — OXYTOCIN 10 UNIT/ML IJ SOLN
40.0000 [IU] | INTRAVENOUS | Status: DC | PRN
  Administered 2012-10-04: 40 [IU] via INTRAVENOUS

## 2012-10-04 MED ORDER — PHENYLEPHRINE 40 MCG/ML (10ML) SYRINGE FOR IV PUSH (FOR BLOOD PRESSURE SUPPORT)
PREFILLED_SYRINGE | INTRAVENOUS | Status: AC
  Filled 2012-10-04: qty 15

## 2012-10-04 MED ORDER — ONDANSETRON HCL 4 MG/2ML IJ SOLN
INTRAMUSCULAR | Status: AC
  Filled 2012-10-04: qty 2

## 2012-10-04 MED ORDER — PROMETHAZINE HCL 25 MG/ML IJ SOLN: 6.2500 mg | INTRAMUSCULAR | Status: DC | PRN

## 2012-10-04 MED ORDER — CEFAZOLIN SODIUM-DEXTROSE 2-3 GM-% IV SOLR
INTRAVENOUS | Status: AC
  Filled 2012-10-04: qty 50

## 2012-10-04 MED ORDER — PROMETHAZINE HCL 25 MG/ML IJ SOLN
12.5000 mg | Freq: Four times a day (QID) | INTRAMUSCULAR | Status: DC | PRN
  Administered 2012-10-04: 12.5 mg via INTRAVENOUS
  Filled 2012-10-04: qty 1

## 2012-10-04 MED ORDER — IBUPROFEN 600 MG PO TABS
600.0000 mg | ORAL_TABLET | Freq: Four times a day (QID) | ORAL | Status: DC
  Administered 2012-10-05 – 2012-10-06 (×6): 600 mg via ORAL
  Filled 2012-10-04 (×7): qty 1

## 2012-10-04 MED ORDER — TETANUS-DIPHTH-ACELL PERTUSSIS 5-2.5-18.5 LF-MCG/0.5 IM SUSP: 0.5000 mL | Freq: Once | INTRAMUSCULAR | Status: DC

## 2012-10-04 MED ORDER — DIPHENHYDRAMINE HCL 25 MG PO CAPS: 25.0000 mg | ORAL_CAPSULE | Freq: Four times a day (QID) | ORAL | Status: DC | PRN

## 2012-10-04 SURGICAL SUPPLY — 23 items
CLOTH BEACON ORANGE TIMEOUT ST (SAFETY) ×2 IMPLANT
DRESSING TELFA 8X3 (GAUZE/BANDAGES/DRESSINGS) ×2 IMPLANT
DRSG VASELINE 3X18 (GAUZE/BANDAGES/DRESSINGS) ×2 IMPLANT
DURAPREP 26ML APPLICATOR (WOUND CARE) ×2 IMPLANT
ELECT REM PT RETURN 9FT ADLT (ELECTROSURGICAL) ×2
ELECTRODE REM PT RTRN 9FT ADLT (ELECTROSURGICAL) ×1 IMPLANT
GAUZE SPONGE 4X4 12PLY STRL LF (GAUZE/BANDAGES/DRESSINGS) ×2 IMPLANT
GAUZE VASELINE 3X9 (GAUZE/BANDAGES/DRESSINGS) ×2 IMPLANT
GLOVE BIO SURGEON STRL SZ7.5 (GLOVE) ×4 IMPLANT
GOWN PREVENTION PLUS LG XLONG (DISPOSABLE) ×4 IMPLANT
GOWN PREVENTION PLUS XLARGE (GOWN DISPOSABLE) ×2 IMPLANT
NS IRRIG 1000ML POUR BTL (IV SOLUTION) ×2 IMPLANT
PACK C SECTION WH (CUSTOM PROCEDURE TRAY) ×2 IMPLANT
PAD ABD 7.5X8 STRL (GAUZE/BANDAGES/DRESSINGS) IMPLANT
PAD OB MATERNITY 4.3X12.25 (PERSONAL CARE ITEMS) ×2 IMPLANT
RTRCTR C-SECT PINK 25CM LRG (MISCELLANEOUS) ×2 IMPLANT
SLEEVE SCD COMPRESS KNEE MED (MISCELLANEOUS) ×2 IMPLANT
STAPLER VISISTAT 35W (STAPLE) ×2 IMPLANT
SUT VIC AB 0 CT1 36 (SUTURE) ×16 IMPLANT
SUT VIC AB 3-0 CTX 36 (SUTURE) ×2 IMPLANT
TAPE CLOTH SURG 4X10 WHT LF (GAUZE/BANDAGES/DRESSINGS) ×2 IMPLANT
TOWEL OR 17X24 6PK STRL BLUE (TOWEL DISPOSABLE) ×4 IMPLANT
TRAY FOLEY CATH 14FR (SET/KITS/TRAYS/PACK) ×2 IMPLANT

## 2012-10-04 NOTE — Anesthesia Postprocedure Evaluation (Signed)
Anesthesia Post Note  Patient: Valerie Cortez  Procedure(s) Performed: Procedure(s) (LRB): CESAREAN SECTION (N/A)  Anesthesia type: Epidural  Patient location: PACU  Post pain: Pain level controlled  Post assessment: Post-op Vital signs reviewed  Last Vitals:  Filed Vitals:   10/04/12 1430  BP: 102/62  Pulse: 99  Temp:   Resp: 20    Post vital signs: Reviewed  Level of consciousness: awake  Complications: No apparent anesthesia complications

## 2012-10-04 NOTE — Anesthesia Procedure Notes (Signed)
Epidural Patient location during procedure: OB Start time: 10/04/2012 9:26 AM End time: 10/04/2012 9:30 AM  Staffing Anesthesiologist: Sandrea Hughs Performed by: anesthesiologist   Preanesthetic Checklist Completed: patient identified, site marked, surgical consent, pre-op evaluation, timeout performed, IV checked, risks and benefits discussed and monitors and equipment checked  Epidural Patient position: sitting Prep: site prepped and draped and DuraPrep Patient monitoring: continuous pulse ox and blood pressure Approach: midline Injection technique: LOR air  Needle:  Needle type: Tuohy  Needle gauge: 17 G Needle length: 9 cm and 9 Needle insertion depth: 6 cm Catheter type: closed end flexible Catheter size: 19 Gauge Catheter at skin depth: 11 cm Test dose: negative  Assessment Events: blood not aspirated, injection not painful, no injection resistance, negative IV test and no paresthesia  Additional Notes Reason for block:procedure for pain

## 2012-10-04 NOTE — Transfer of Care (Signed)
Immediate Anesthesia Transfer of Care Note  Patient: Valerie Cortez  Procedure(s) Performed: Procedure(s) (LRB) with comments: CESAREAN SECTION (N/A)  Patient Location: PACU  Anesthesia Type:Epidural  Level of Consciousness: awake, alert  and oriented  Airway & Oxygen Therapy: Patient Spontanous Breathing  Post-op Assessment: Report given to PACU RN and Post -op Vital signs reviewed and stable  Post vital signs: Reviewed and stable  Complications: No apparent anesthesia complications

## 2012-10-04 NOTE — Anesthesia Preprocedure Evaluation (Signed)
Anesthesia Evaluation  Patient identified by MRN, date of birth, ID band Patient awake    Reviewed: Allergy & Precautions, H&P , NPO status , Patient's Chart, lab work & pertinent test results  Airway Mallampati: II TM Distance: >3 FB Neck ROM: full    Dental No notable dental hx.    Pulmonary neg pulmonary ROS,    Pulmonary exam normal       Cardiovascular negative cardio ROS      Neuro/Psych negative psych ROS   GI/Hepatic negative GI ROS, Neg liver ROS,   Endo/Other  negative endocrine ROS  Renal/GU negative Renal ROS  negative genitourinary   Musculoskeletal negative musculoskeletal ROS (+)   Abdominal Normal abdominal exam  (+)   Peds negative pediatric ROS (+)  Hematology negative hematology ROS (+)   Anesthesia Other Findings   Reproductive/Obstetrics (+) Pregnancy                           Anesthesia Physical Anesthesia Plan  ASA: II  Anesthesia Plan: Epidural   Post-op Pain Management:    Induction:   Airway Management Planned:   Additional Equipment:   Intra-op Plan:   Post-operative Plan:   Informed Consent: I have reviewed the patients History and Physical, chart, labs and discussed the procedure including the risks, benefits and alternatives for the proposed anesthesia with the patient or authorized representative who has indicated his/her understanding and acceptance.     Plan Discussed with:   Anesthesia Plan Comments:         Anesthesia Quick Evaluation  

## 2012-10-04 NOTE — Op Note (Signed)
NAME:  Valerie Cortez, Valerie Cortez                    ACCOUNT NO.:  [id number]  MEDICAL RECORD NO.:  [id number]  LOCATION:                                 FACILITY:  PHYSICIAN:  Malachi Pro. Ambrose Mantle, M.D. DATE OF BIRTH:  Nov 06, 1991  DATE OF PROCEDURE:  10/04/2012 DATE OF DISCHARGE:                              OPERATIVE REPORT   PREOPERATIVE DIAGNOSIS:  Intrauterine pregnancy at 40 weeks and 5 days. Partially prolapsed cord.  POSTOPERATIVE DIAGNOSIS:  Intrauterine pregnancy at 40 weeks and 5 days. Partially prolapsed cord.  OPERATION:  Low-transverse cervical C-section.  OPERATOR:  Malachi Pro. Ambrose Mantle, M.D.  ANESTHESIA:  Epidural anesthesia.  The patient was 3 cm dilated.  The vertex was presenting but posterior to the vertex.  We could count the fetal heart rate by  palpating the cord.  I consulted with Dr. Harlon Flor from [facility], and he felt that you should proceed with C-section.  PROCEDURE IN DETAIL:  The patient was brought to the operating room, fetal heart rate was kept under constant observation, was completely normal in the 150s.  A Foley catheter was indwelling.  She had sequential compression devices, a time-out was done.  The abdomen was prepped with DuraPrep and after 3 minutes  the abdomen was draped as a sterile field.  Anesthesia was confirmed by pinching the lower abdomen with an Allis clamp.  A transverse incision was made and carried in layers through the skin, subcutaneous tissue, and fascia.  Fascia was separated from the rectus muscles superiorly and inferiorly.  Rectus muscle split in the midline.  Peritoneum opened vertically.  The lower uterine segment was exposed with an Teacher, early years/pre.  A small incision was made transversely in the lower uterine segment through the superficial layers of the myometrium.  I went the rest of the way into the amniotic sac with my finger, pulled superiorly and inferiorly, delivered the vertex easily.  The nose and pharynx was suctioned  with the bulb.  The cord was clamped.  The infant was given to Dr. Joana Reamer who was in attendance.  She assigned the female infant with Apgars of 9 at one and 9 at five minutes.  The placenta was removed intact.  All membranes were removed.  The inside of the uterus was palpated and found to be free of any products of conception.  Uterine incision was then closed in 2 running sutures of 0-Vicryl locking the 1st layer, non- locking on the second layer and another figure-of-eight suture was required for slight hematoma just above the incision in the midline. Liberal irrigation confirmed hemostasis.  Both tubes and ovaries and the uterus appeared normal.  The Alexis retractor was removed and the abdominal wall was closed in layers using interrupted sutures of 0 Vicryl to close the rectus muscle and peritoneum in one layer, 2 running sutures of 0 Vicryl on the fascia, running 3-0 Vicryl on the subcutaneous tissue, and staples on the skin.  The patient seemed to tolerate the procedure well.  Blood loss was estimated no more than 1000 mL.  Sponge and needle counts were correct and the patient was returned to recovery in satisfactory condition.  Malachi Pro. Ambrose Mantle, M.D.     TFH/MEDQ  D:  10/04/2012  T:  10/04/2012  Job:  [id number]

## 2012-10-04 NOTE — Progress Notes (Addendum)
Patient ID: Valerie Cortez, female   DOB: June 07, 1991, 21 y.o.   MRN: [id number] The cervix is 3 cm 90 % effaced and the vertex is at -2 station. The RN and I can palpate the FHR on the cord which is still posterior to the vertex. I consulted with Dr. Harlon Flor and he advises c section

## 2012-10-04 NOTE — Progress Notes (Signed)
Ambrose Mantle, MD, notified of SVE and pulsating cord noted on posterior. MD on his way to the hospital to assess.

## 2012-10-04 NOTE — Progress Notes (Signed)
Patient ID: Valerie Cortez, female   DOB: 31-Aug-1991, 21 y.o.   MRN: [id number]  pt has been on low dose pitocin through the night. It is now on 5 mu/ minute and the contractions are q 5 minutes. The cervix is FT 30% effaced and the vertex is at - 3 station. AROM produced lightly meconium stained fluid.

## 2012-10-04 NOTE — Progress Notes (Signed)
Patient ID: Valerie Cortez, female   DOB: [birth year], 21 y.o.   MRN: [id number] Pitocin at 9 mu/ minute and the contractions are q 2-3 minutes.The RN examined her and found a hand protruding through the cervix. I replaced the hand into the uterus but the vertex was anterior to the cervix so it did not occlude the cervix. Will get an epidural and observe the FHR and decide if we can proceed to a vaginal delivery.

## 2012-10-04 NOTE — Consult Note (Signed)
Neonatology Note:   Attendance at C-section:    I was asked to attend this primary C/S at term. The mother is a G1P0 AB pos, GBS neg with occult cord prolapse. ROM 5 hours prior to delivery, fluid clear. There was a cord around the body. Infant vigorous with good spontaneous cry and tone. Needed only minimal bulb suctioning. Ap 9/9. Lungs clear to ausc in DR. To CN to care of Pediatrician.   Deatra James, MD

## 2012-10-04 NOTE — Progress Notes (Signed)
Valerie Mantle, MD, at bedside to evaluate patient. MD discussed with patient about the possibility of a cesarean section. Patient verbalizes understanding and agrees with this plan of care if necessary.

## 2012-10-04 NOTE — Progress Notes (Signed)
Patient ID: Valerie Cortez, female   DOB: 06-27-1991, 21 y.o.   MRN: [id number] I was called because the RN felt pulsations behind the vertex. My exam shows the cervix to be 2-3 cm 80 % effaced and the vertex is at - 2 station. I do not feel a prolapsed cord so will not do a c section at this time.

## 2012-10-05 ENCOUNTER — Encounter (HOSPITAL_COMMUNITY): Payer: Self-pay | Admitting: Obstetrics and Gynecology

## 2012-10-05 LAB — CBC
Hemoglobin: 7.6 g/dL — ABNORMAL LOW (ref 12.0–15.0)
MCH: 26.5 pg (ref 26.0–34.0)
Platelets: 139 10*3/uL — ABNORMAL LOW (ref 150–400)
RBC: 2.87 MIL/uL — ABNORMAL LOW (ref 3.87–5.11)
WBC: 9.1 10*3/uL (ref 4.0–10.5)

## 2012-10-05 NOTE — Addendum Note (Signed)
Addendum  created 10/05/12 1035 by Jhonnie Garner, CRNA   Modules edited:Notes Section

## 2012-10-05 NOTE — Progress Notes (Signed)
Patient ID: Valerie Cortez, female   DOB: Jun 22, 1991, 21 y.o.   MRN: [id number] #1 afebrile BP normal Output good No pain Has ambulated. HGB drop greater than expected Will repeat in AM

## 2012-10-05 NOTE — Anesthesia Postprocedure Evaluation (Signed)
Anesthesia Post Note  Patient: Valerie Cortez  Procedure(s) Performed: Procedure(s) (LRB): CESAREAN SECTION (N/A)  Anesthesia type: Epidural  Patient location: Mother/Baby  Post pain: Pain level controlled  Post assessment: Post-op Vital signs reviewed  Last Vitals:  Filed Vitals:   10/05/12 0744  BP: 100/63  Pulse: 96  Temp: 36.6 C  Resp: 18    Post vital signs: Reviewed  Level of consciousness: awake  Complications: No apparent anesthesia complications

## 2012-10-06 LAB — CBC WITH DIFFERENTIAL/PLATELET
Basophils Absolute: 0.1 10*3/uL (ref 0.0–0.1)
Eosinophils Relative: 2 % (ref 0–5)
HCT: 25.2 % — ABNORMAL LOW (ref 36.0–46.0)
Hemoglobin: 8 g/dL — ABNORMAL LOW (ref 12.0–15.0)
Lymphocytes Relative: 17 % (ref 12–46)
Lymphs Abs: 1.8 10*3/uL (ref 0.7–4.0)
MCV: 83.4 fL (ref 78.0–100.0)
Monocytes Absolute: 0.5 10*3/uL (ref 0.1–1.0)
Neutro Abs: 7.6 10*3/uL (ref 1.7–7.7)
RBC: 3.02 MIL/uL — ABNORMAL LOW (ref 3.87–5.11)
WBC: 10.2 10*3/uL (ref 4.0–10.5)

## 2012-10-06 MED ORDER — IBUPROFEN 600 MG PO TABS: 600.0000 mg | ORAL_TABLET | Freq: Four times a day (QID) | ORAL | Status: DC | PRN

## 2012-10-06 NOTE — Progress Notes (Signed)
Patient ID: Valerie Cortez, female   DOB: 1991/09/20, 21 y.o.   MRN: [id number] #2 afebrile requests d/c Doing well

## 2012-10-06 NOTE — Progress Notes (Signed)
Patient ID: Valerie Cortez, female   DOB: 10/14/1991, 21 y.o.   MRN: [id number] #2 afebrile BP normal Has had 2 BM's HGB stable.

## 2012-10-07 NOTE — Discharge Summary (Signed)
NAME:  Valerie Cortez, Valerie Cortez NO.:  [id number]  MEDICAL RECORD NO.:  [id number]  LOCATION:  9116                          FACILITY:  [facility]  PHYSICIAN:  Malachi Pro. Ambrose Mantle, M.D. DATE OF BIRTH:  1991/08/22  DATE OF ADMISSION:  10/03/2012 DATE OF DISCHARGE:  10/06/2012                              DISCHARGE SUMMARY   This is a 21 year old white female, para 0, gravida 1, EDC September 29, 2012, by an ultrasound at 10 weeks and 6 days, admitted for cervical ripening.  The patient underwent cervical ripening.  Her prenatal course was uncomplicated except for positive Chlamydia x2.  Blood group and type, Pap smear, rubella, RPR, urine culture, hepatitis B surface antigen, HIV, GC were all negative the Chlamydia was positive x2.  Group B strep was negative.  One-hour Glucola was 129.  The patient was placed on Pitocin through the night.  Contracture was too off, given Cytotec. By the morning after admission, the cervix was a fingertip 30%, vertex at a -3 and artificial rupture of membranes produced lightly meconium- stained fluid.  Pitocin was increased to 9 milliunits a minute and by 9:07 a.m., the contractions every 2-3 minutes.  This RN found a hand protruding through the cervix.  I replaced the hand into the uterus with the vertex was anterior to the cervix so I did not occlude the cervix. The patient was given an epidural.  Later the RN felt pulsations behind the vertex thinking that she had a prolapsed cord.  I examined the cervix, it was 2-3 cm 80%, vertex was at -2.  I did not feel a prolapse cord so I did not request a C-section at that time.  Subsequently, the nurse was able to palpate the fetal heart rate on the cord so I examined the patient at 12:46 p.m. and even though the cord was posterior to the vertex, I consulted with Dr. Harlon Flor from [facility] and he felt that since I could feel the cord that I should proceed with a C-section.  The patient was advised and she was very  agreeable.  We proceeded to do a low- transverse cervical C-section under epidural anesthesia with delivery of a very healthy infant who had Apgars of 9 and 9 at 1 and 5 minutes.  The uterus, tubes, and ovaries appeared normal.  Postoperatively, the patient did well.  She did have a larger drop in hemoglobin, so I repeated the hemoglobin the second postop day, it was slightly improved and the patient requested discharge.  Initial hemoglobin 11.7, hematocrit 35.4, white count 10,200, platelet count 209,000.  Followup hemoglobin was 7.6, and on the second postop day, it was 8.0.  FINAL DIAGNOSES:  Intrauterine pregnancy, 40 weeks and 5 days, delivered by C-section, prolapsed cord, history of positive Chlamydia.  OPERATION:  Low-transverse cervical C-section.  FINAL CONDITION:  Improved.  INSTRUCTIONS:  Include our regular discharge instruction booklet as well as the after visit summary.  PRESCRIPTION MEDICATIONS:  Motrin 600 mg 30 tablets, 1 every 6 hours as needed for pain.  The patient is advised to take oral iron twice a day for at least 6 weeks.  To return to  the office in 3-4 days to have her staples removed, and to call with any problems.  She is given a copy of our after visit summary, as well as the discharge instruction booklet.     Malachi Pro. Ambrose Mantle, M.D.     TFH/MEDQ  D:  10/06/2012  T:  10/07/2012  Job:  [id number]

## 2012-10-14 ENCOUNTER — Encounter (HOSPITAL_COMMUNITY): Payer: Self-pay | Admitting: *Deleted

## 2012-10-14 ENCOUNTER — Inpatient Hospital Stay (HOSPITAL_COMMUNITY)
Admission: AD | Admit: 2012-10-14 | Discharge: 2012-10-15 | Disposition: A | Discharge status: Home / Self Care | Payer: BC Managed Care – PPO | Source: Ambulatory Visit | Attending: Obstetrics and Gynecology | Admitting: Obstetrics and Gynecology

## 2012-10-14 DIAGNOSIS — O9122 Nonpurulent mastitis associated with the puerperium: Secondary | ICD-10-CM | POA: Insufficient documentation

## 2012-10-14 DIAGNOSIS — N61 Mastitis without abscess: Secondary | ICD-10-CM

## 2012-10-14 DIAGNOSIS — O864 Pyrexia of unknown origin following delivery: Secondary | ICD-10-CM | POA: Insufficient documentation

## 2012-10-14 LAB — URINALYSIS, ROUTINE W REFLEX MICROSCOPIC
Bilirubin Urine: NEGATIVE
Glucose, UA: NEGATIVE mg/dL
Ketones, ur: NEGATIVE mg/dL
Nitrite: NEGATIVE
Specific Gravity, Urine: <1.005 — ABNORMAL LOW (ref 1.005–1.030)
pH: 6.5 (ref 5.0–8.0)

## 2012-10-14 LAB — URINE MICROSCOPIC-ADD ON

## 2012-10-14 MED ORDER — ACETAMINOPHEN 500 MG PO TABS
1000.0000 mg | ORAL_TABLET | Freq: Once | ORAL | Status: AC
  Administered 2012-10-14: 1000 mg via ORAL
  Filled 2012-10-14: qty 2

## 2012-10-14 MED ORDER — LACTATED RINGERS IV SOLN
Freq: Once | INTRAVENOUS | Status: AC
  Administered 2012-10-15: 01:00:00 via INTRAVENOUS

## 2012-10-14 NOTE — MAU Note (Signed)
Incision clean and dry. Steristrips in place.

## 2012-10-14 NOTE — MAU Note (Signed)
Had C/S 11/20 after being induced. Today I have a h/a , back pain that comes around to front

## 2012-10-14 NOTE — MAU Note (Signed)
Haven't felt well since going home. Freezing cold all day and wrapped up in blanket and then got really hot. Took temp and 102. L breast with knot and red. Breasts sore. Feeling dizzy and nauseated

## 2012-10-14 NOTE — MAU Note (Signed)
Lactation called per request Dr Ellyn Hack. Line busy for lactation. MB called and will let lactation know if they see her

## 2012-10-14 NOTE — MAU Provider Note (Signed)
History     CSN: [id number]  Arrival date and time: 10/14/12 2139   None     Chief Complaint  Patient presents with  . Fever   HPI  Pt is here status post a csection on 11/20 for malpositioned umbilical cord during labor.  Pt reports left breast pain, fever, and body aches that started today.  Reports scant to moderate bleeding.  Pain controlled with ibuprofen.  Also reports right lower back pain.  No UTI symptoms.  Unable to pump yesterday like she needed.  Breast feels hot to the touch per pt.     Past Medical History  Diagnosis Date  . Arthritis     with broken bones  . Constipation     treated with diet change  . Headache     migraines  . Hx of chlamydia infection     Past Surgical History  Procedure Date  . Wisdom tooth extraction   . Cesarean section 10/04/2012    Procedure: CESAREAN SECTION;  Surgeon: Bing Plume, MD;  Location: [facility];  Service: Obstetrics;  Laterality: N/A;    Family History  Problem Relation Age of Onset  . Cerebral palsy Mother   . Fibroids Mother   . Diverticulitis Mother   . Migraines Mother   . Asthma Brother   . Cancer Maternal Grandmother     ovarian  . COPD Maternal Grandfather   . Diabetes Maternal Grandfather   . Heart disease Maternal Grandfather     heart valve  . Hypertension Maternal Grandfather   . Cancer Paternal Grandfather     lung    History  Substance Use Topics  . Smoking status: Never Smoker   . Smokeless tobacco: Never Used  . Alcohol Use: No    Allergies:  Allergies  Allergen Reactions  . Peanut-Containing Drug Products Hives  . Red Dye Nausea And Vomiting    Red food coloring    Prescriptions prior to admission  Medication Sig Dispense Refill  . acetaminophen (TYLENOL) 325 MG tablet Take 650 mg by mouth every 6 (six) hours as needed.      Marland Kitchen albuterol (PROVENTIL HFA;VENTOLIN HFA) 108 (90 BASE) MCG/ACT inhaler Inhale 2 puffs into the lungs every 6 (six) hours as needed. For shortness of  breath      . ferrous fumarate (HEMOCYTE - 106 MG FE) 325 (106 FE) MG TABS Take 1 tablet by mouth 2 (two) times daily.      . Prenatal Vit-Fe Fumarate-FA (PRENATAL MULTIVITAMIN) TABS Take 1 tablet by mouth at bedtime.      . calcium carbonate (TUMS - DOSED IN MG ELEMENTAL CALCIUM) 500 MG chewable tablet Chew 2 tablets by mouth daily as needed. For heartburn      . ibuprofen (ADVIL,MOTRIN) 600 MG tablet Take 1 tablet (600 mg total) by mouth every 6 (six) hours as needed for pain.  30 tablet  1    Review of Systems  Constitutional: Positive for fever, chills and malaise/fatigue.       Breast pain  HENT: Negative for sore throat.   Respiratory: Negative for cough.   Gastrointestinal: Positive for abdominal pain (cramping).  Genitourinary: Negative.   Musculoskeletal: Positive for back pain.  Neurological: Negative for headaches.  All other systems reviewed and are negative.   Physical Exam   Blood pressure 117/68, pulse 138, temperature 102.2 F (39 C), temperature source Oral, resp. rate 20, SpO2 100.00%, currently breastfeeding.  Physical Exam  Constitutional: She is oriented to person,  place, and time. She appears well-developed and well-nourished.       Ill appearing  HENT:  Head: Normocephalic.  Neck: Normal range of motion. Neck supple.  Cardiovascular: Normal rate, regular rhythm and normal heart sounds.   Respiratory: Effort normal and breath sounds normal. No respiratory distress.    GI: Soft. There is no tenderness.       Incision site - no redness, no abnormal discharge or odor.    Genitourinary: There is bleeding (scant; dark red; no bright red or brisk bleed) around the vagina. Vaginal discharge: vaginal bleeding.  Musculoskeletal: Normal range of motion. She exhibits no edema.  Neurological: She is alert and oriented to person, place, and time. She has normal reflexes.  Skin: Skin is warm and dry.  Nipples red and irritated appearing.    MAU Course   Procedures  Results for orders placed during the hospital encounter of 10/14/12 (from the past 24 hour(s))  URINALYSIS, ROUTINE W REFLEX MICROSCOPIC     Status: Abnormal   Collection Time   10/14/12 10:33 PM      Component Value Range   Color, Urine YELLOW  YELLOW   APPearance CLEAR  CLEAR   Specific Gravity, Urine <1.005 (*) 1.005 - 1.030   pH 6.5  5.0 - 8.0   Glucose, UA NEGATIVE  NEGATIVE mg/dL   Hgb urine dipstick MODERATE (*) NEGATIVE   Bilirubin Urine NEGATIVE  NEGATIVE   Ketones, ur NEGATIVE  NEGATIVE mg/dL   Protein, ur NEGATIVE  NEGATIVE mg/dL   Urobilinogen, UA 0.2  0.0 - 1.0 mg/dL   Nitrite NEGATIVE  NEGATIVE   Leukocytes, UA TRACE (*) NEGATIVE  URINE MICROSCOPIC-ADD ON     Status: Normal   Collection Time   10/14/12 10:33 PM      Component Value Range   Squamous Epithelial / LPF RARE  RARE   WBC, UA 3-6  <3 WBC/hpf   RBC / HPF 3-6  <3 RBC/hpf   Bacteria, UA RARE  RARE     Assessment and Plan  Left Breast Mastitis  Plan: Dicloxacillin 500 mg QID x 10 days. Increase fluids. Rest, rest, rest Encouraged frequent pumping of breast Apply lotrimin cream and triple antibiotic ointment to nipples per lactation consultant.     [facility] 10/14/2012, 11:12 PM

## 2012-10-14 NOTE — MAU Note (Signed)
Lactation consultant in to see pt. 

## 2012-10-14 NOTE — Consult Note (Signed)
Call from RN in MAU to evaluate Valerie Cortez.  She has reddened areas in the upper medial quadrant of the left breast with firm nodules deep in the tissue. The left nipple has a small scabbed area from when the baby latched on Sunday. Her nipples started sticking to her bra after that.  Both nipples are reddened bilaterally and pt states that they started "burning" in the shower a few days ago.    She noticed the nodules Tuesday night into Wednesday.   LC changed flange on breast pump to a #27 and lubricated the left one to reduce the friction while pumping.  Breast emptied quickly and became comfortable but pain came back when they began to fill again.    Plan:  Rest  Drink fluids to [facility] breast milk every 2 hours x 15 minutes for the next 24 hours and every 3 hours after that  Used any prescribed medication as directed by physician.

## 2012-10-15 DIAGNOSIS — N61 Mastitis without abscess: Secondary | ICD-10-CM

## 2012-10-15 MED ORDER — DICLOXACILLIN SODIUM 500 MG PO CAPS: 500.0000 mg | ORAL_CAPSULE | Freq: Four times a day (QID) | ORAL | Status: DC

## 2012-10-15 MED ORDER — DICLOXACILLIN SODIUM 500 MG PO CAPS
500.0000 mg | ORAL_CAPSULE | ORAL | Status: AC
  Administered 2012-10-15: 500 mg via ORAL
  Filled 2012-10-15: qty 1

## 2012-10-15 NOTE — Progress Notes (Signed)
Written and verbal d/c instructions given and understanding voiced. 

## 2012-11-22 ENCOUNTER — Other Ambulatory Visit (HOSPITAL_COMMUNITY): Payer: Self-pay | Admitting: Obstetrics and Gynecology

## 2012-11-22 DIAGNOSIS — R1011 Right upper quadrant pain: Secondary | ICD-10-CM

## 2012-11-23 ENCOUNTER — Ambulatory Visit (HOSPITAL_COMMUNITY): Payer: BC Managed Care – PPO

## 2012-11-28 ENCOUNTER — Other Ambulatory Visit (HOSPITAL_COMMUNITY): Payer: Self-pay | Admitting: Obstetrics and Gynecology

## 2012-11-28 DIAGNOSIS — R1011 Right upper quadrant pain: Secondary | ICD-10-CM

## 2012-11-29 ENCOUNTER — Inpatient Hospital Stay (HOSPITAL_COMMUNITY)
Admission: EM | Admit: 2012-11-29 | Discharge: 2012-12-06 | DRG: 494 | Disposition: A | Discharge status: Home / Self Care | Payer: BC Managed Care – PPO | Attending: General Surgery | Admitting: General Surgery

## 2012-11-29 ENCOUNTER — Encounter (HOSPITAL_COMMUNITY): Payer: Self-pay | Admitting: *Deleted

## 2012-11-29 ENCOUNTER — Ambulatory Visit (HOSPITAL_COMMUNITY)
Admission: RE | Admit: 2012-11-29 | Discharge: 2012-11-29 | Disposition: A | Discharge status: Home / Self Care | Payer: BC Managed Care – PPO | Source: Ambulatory Visit | Attending: Obstetrics and Gynecology | Admitting: Obstetrics and Gynecology

## 2012-11-29 ENCOUNTER — Ambulatory Visit (HOSPITAL_COMMUNITY): Payer: BC Managed Care – PPO

## 2012-11-29 DIAGNOSIS — K838 Other specified diseases of biliary tract: Secondary | ICD-10-CM | POA: Diagnosis present

## 2012-11-29 DIAGNOSIS — R1011 Right upper quadrant pain: Secondary | ICD-10-CM

## 2012-11-29 DIAGNOSIS — Z79899 Other long term (current) drug therapy: Secondary | ICD-10-CM

## 2012-11-29 DIAGNOSIS — K8020 Calculus of gallbladder without cholecystitis without obstruction: Secondary | ICD-10-CM | POA: Insufficient documentation

## 2012-11-29 DIAGNOSIS — K5900 Constipation, unspecified: Secondary | ICD-10-CM | POA: Diagnosis present

## 2012-11-29 DIAGNOSIS — K8010 Calculus of gallbladder with chronic cholecystitis without obstruction: Secondary | ICD-10-CM

## 2012-11-29 DIAGNOSIS — K8050 Calculus of bile duct without cholangitis or cholecystitis without obstruction: Secondary | ICD-10-CM | POA: Diagnosis present

## 2012-11-29 DIAGNOSIS — K859 Acute pancreatitis without necrosis or infection, unspecified: Principal | ICD-10-CM | POA: Diagnosis present

## 2012-11-29 DIAGNOSIS — K851 Biliary acute pancreatitis without necrosis or infection: Secondary | ICD-10-CM | POA: Diagnosis present

## 2012-11-29 LAB — URINALYSIS, ROUTINE W REFLEX MICROSCOPIC
Glucose, UA: NEGATIVE mg/dL
Hgb urine dipstick: NEGATIVE
Nitrite: NEGATIVE
Specific Gravity, Urine: 1.015 (ref 1.005–1.030)
pH: 8 (ref 5.0–8.0)

## 2012-11-29 LAB — COMPREHENSIVE METABOLIC PANEL
ALT: 552 U/L — ABNORMAL HIGH (ref 0–35)
AST: 569 U/L — ABNORMAL HIGH (ref 0–37)
Albumin: 4.2 g/dL (ref 3.5–5.2)
Alkaline Phosphatase: 356 U/L — ABNORMAL HIGH (ref 39–117)
Chloride: 100 mEq/L (ref 96–112)
GFR calc Af Amer: >90 mL/min (ref >90)
Potassium: 3.7 mEq/L (ref 3.5–5.1)
Sodium: 140 mEq/L (ref 135–145)
Total Bilirubin: 2.5 mg/dL — ABNORMAL HIGH (ref 0.3–1.2)
Total Protein: 7.9 g/dL (ref 6.0–8.3)

## 2012-11-29 LAB — CBC WITH DIFFERENTIAL/PLATELET
Basophils Relative: 1 % (ref 0–1)
Eosinophils Absolute: 0.2 10*3/uL (ref 0.0–0.7)
Hemoglobin: 13.6 g/dL (ref 12.0–15.0)
MCH: 25.8 pg — ABNORMAL LOW (ref 26.0–34.0)
MCHC: 33 g/dL (ref 30.0–36.0)
Neutro Abs: 6.2 10*3/uL (ref 1.7–7.7)
Neutrophils Relative %: 66 % (ref 43–77)
Platelets: 270 10*3/uL (ref 150–400)
RBC: 5.28 MIL/uL — ABNORMAL HIGH (ref 3.87–5.11)

## 2012-11-29 MED ORDER — AZITHROMYCIN 250 MG PO TABS: 1000.0000 mg | ORAL_TABLET | Freq: Once | ORAL | Status: DC

## 2012-11-29 MED ORDER — HYDROMORPHONE HCL PF 1 MG/ML IJ SOLN
1.0000 mg | Freq: Once | INTRAMUSCULAR | Status: AC
  Administered 2012-11-29: 1 mg via INTRAVENOUS
  Filled 2012-11-29: qty 1

## 2012-11-29 MED ORDER — PROMETHAZINE HCL 25 MG/ML IJ SOLN
25.0000 mg | Freq: Once | INTRAMUSCULAR | Status: AC
  Administered 2012-11-29: 25 mg via INTRAVENOUS
  Filled 2012-11-29: qty 1

## 2012-11-29 MED ORDER — FENTANYL CITRATE 0.05 MG/ML IJ SOLN
100.0000 ug | Freq: Once | INTRAMUSCULAR | Status: AC
  Administered 2012-11-29: 100 ug via INTRAVENOUS
  Filled 2012-11-29: qty 2

## 2012-11-29 MED ORDER — ONDANSETRON HCL 4 MG/2ML IJ SOLN
4.0000 mg | Freq: Once | INTRAMUSCULAR | Status: AC
  Administered 2012-11-29: 4 mg via INTRAVENOUS
  Filled 2012-11-29: qty 2

## 2012-11-29 NOTE — H&P (Signed)
Valerie Cortez is an 22 y.o. female.   Chief Complaint: abdominal pain nausea vomiting HPI: 1 day history of nausea vomiting and epigastric abdominal pain  Got outpatient U/S today that shows gallstones. Seen in ED.  Elevated lipase,  Lfts.  Asked to see at request of Dr Silverio Lay.  Still has some epigastric pain.  Severe.  Nausea an issue.  8 weeks post delivery  Past Medical History  Diagnosis Date  . Arthritis     with broken bones  . Constipation     treated with diet change  . Headache     migraines  . Hx of chlamydia infection     Past Surgical History  Procedure Date  . Wisdom tooth extraction   . Cesarean section 10/04/2012    Procedure: CESAREAN SECTION;  Surgeon: Bing Plume, MD;  Location: [facility];  Service: Obstetrics;  Laterality: N/A;    Family History  Problem Relation Age of Onset  . Cerebral palsy Mother   . Fibroids Mother   . Diverticulitis Mother   . Migraines Mother   . Asthma Brother   . Cancer Maternal Grandmother     ovarian  . COPD Maternal Grandfather   . Diabetes Maternal Grandfather   . Heart disease Maternal Grandfather     heart valve  . Hypertension Maternal Grandfather   . Cancer Paternal Grandfather     lung   Social History:  reports that she has never smoked. She has never used smokeless tobacco. She reports that she does not drink alcohol or use illicit drugs.  Allergies:  Allergies  Allergen Reactions  . Peanut-Containing Drug Products Hives  . Red Dye Nausea And Vomiting    Red food coloring     (Not in a hospital admission)  Results for orders placed during the hospital encounter of 11/29/12 (from the past 48 hour(s))  CBC WITH DIFFERENTIAL     Status: Abnormal   Collection Time   11/29/12  9:00 PM      Component Value Range Comment   WBC 9.3  4.0 - 10.5 K/uL    RBC 5.28 (*) 3.87 - 5.11 MIL/uL    Hemoglobin 13.6  12.0 - 15.0 g/dL    HCT 45.4  09.8 - 11.9 %    MCV 78.0  78.0 - 100.0 fL    MCH 25.8 (*) 26.0 - 34.0 pg    MCHC  33.0  30.0 - 36.0 g/dL    RDW 14.7  82.9 - 56.2 %    Platelets 270  150 - 400 K/uL    Neutrophils Relative 66  43 - 77 %    Neutro Abs 6.2  1.7 - 7.7 K/uL    Lymphocytes Relative 22  12 - 46 %    Lymphs Abs 2.1  0.7 - 4.0 K/uL    Monocytes Relative 8  3 - 12 %    Monocytes Absolute 0.7  0.1 - 1.0 K/uL    Eosinophils Relative 2  0 - 5 %    Eosinophils Absolute 0.2  0.0 - 0.7 K/uL    Basophils Relative 1  0 - 1 %    Basophils Absolute 0.1  0.0 - 0.1 K/uL   COMPREHENSIVE METABOLIC PANEL     Status: Abnormal   Collection Time   11/29/12  9:00 PM      Component Value Range Comment   Sodium 140  135 - 145 mEq/L    Potassium 3.7  3.5 - 5.1 mEq/L  Chloride 100  96 - 112 mEq/L    CO2 28  19 - 32 mEq/L    Glucose, Bld 100 (*) 70 - 99 mg/dL    BUN 8  6 - 23 mg/dL    Creatinine, Ser 1.61  0.50 - 1.10 mg/dL    Calcium 9.6  8.4 - 09.6 mg/dL    Total Protein 7.9  6.0 - 8.3 g/dL    Albumin 4.2  3.5 - 5.2 g/dL    AST 045 (*) 0 - 37 U/L    ALT 552 (*) 0 - 35 U/L    Alkaline Phosphatase 356 (*) 39 - 117 U/L    Total Bilirubin 2.5 (*) 0.3 - 1.2 mg/dL    GFR calc non Af Amer >90  >90 mL/min    GFR calc Af Amer >90  >90 mL/min   LIPASE, BLOOD     Status: Abnormal   Collection Time   11/29/12  9:00 PM      Component Value Range Comment   Lipase 78 (*) 11 - 59 U/L   URINALYSIS, ROUTINE W REFLEX MICROSCOPIC     Status: Abnormal   Collection Time   11/29/12  9:26 PM      Component Value Range Comment   Color, Urine AMBER (*) YELLOW BIOCHEMICALS MAY BE AFFECTED BY COLOR   APPearance CLEAR  CLEAR    Specific Gravity, Urine 1.015  1.005 - 1.030    pH 8.0  5.0 - 8.0    Glucose, UA NEGATIVE  NEGATIVE mg/dL    Hgb urine dipstick NEGATIVE  NEGATIVE    Bilirubin Urine MODERATE (*) NEGATIVE    Ketones, ur TRACE (*) NEGATIVE mg/dL    Protein, ur NEGATIVE  NEGATIVE mg/dL    Urobilinogen, UA 0.2  0.0 - 1.0 mg/dL    Nitrite NEGATIVE  NEGATIVE    Leukocytes, UA NEGATIVE  NEGATIVE MICROSCOPIC NOT DONE ON  URINES WITH NEGATIVE PROTEIN, BLOOD, LEUKOCYTES, NITRITE, OR GLUCOSE <1000 mg/dL.   US Abdomen Complete  11/29/2012  *RADIOLOGY REPORT*  Clinical Data:  Right upper quadrant pain.  COMPLETE ABDOMINAL ULTRASOUND  Comparison:  None  Findings:  Gallbladder:  Multiple small stones.  Gallbladder wall thickness is normal.  Negative sonographic Murphy's sign.  Common bile duct:  Normal.  4.5 mm in diameter.  Liver:  Normal.  IVC:  Normal.  Pancreas:  Normal.  Spleen:  Normal.  9.1 cm in length.  Right Kidney:  Normal.  11.1 cm in length.  Left Kidney:  Normal.  11.0 cm in length.  Abdominal aorta:  Normal.  1.6 cm maximal diameter.  IMPRESSION: Multiple small gallstones.  Tenderness in the right upper quadrant without a Murphy's sign.   Original Report Authenticated By: Francene Boyers, M.D.     Review of Systems  Constitutional: Negative for fever and chills.  Eyes: Negative.   Respiratory: Negative.   Cardiovascular: Negative.   Gastrointestinal: Positive for nausea, vomiting and abdominal pain.  Musculoskeletal: Negative.   Neurological: Positive for weakness.  Endo/Heme/Allergies: Negative.   Psychiatric/Behavioral: Negative.     Blood pressure 141/89, pulse 66, temperature 97.7 F (36.5 C), resp. rate 16, SpO2 100.00%, currently breastfeeding. Physical Exam  Constitutional: She appears well-developed and well-nourished.  HENT:  Head: Normocephalic and atraumatic.  Eyes: EOM are normal. Pupils are equal, round, and reactive to light.  Neck: Normal range of motion. Neck supple.  Cardiovascular: Normal rate and regular rhythm.   Respiratory: Effort normal and breath sounds normal.  GI: Soft.  There is tenderness in the right upper quadrant and epigastric area.  Musculoskeletal: Normal range of motion.  Neurological: She is alert.  Skin: Skin is warm and dry.  Psychiatric: She has a normal mood and affect. Her behavior is normal. Thought content normal.     Assessment/Plan Gallstone  pancreatitis Elevate LFTs  Admit IVF Recheck labs in am NPO May need ERCP preop vs Lap chole IOC once pancreatitis improves Discussed with patient and husband  Rakin Lemelle A. 11/29/2012, 11:39 PM

## 2012-11-29 NOTE — ED Provider Notes (Signed)
History     CSN: [id number]  Arrival date & time 11/29/12  2020   First MD Initiated Contact with Patient 11/29/12 2110      Chief Complaint  Patient presents with  . Abdominal Pain    (Consider location/radiation/quality/duration/timing/severity/associated sxs/prior treatment) HPI History provided by pt.   Pt had a c-section 2 weeks ago.  Onset intermittent epigastric/RUQ pain w/ N/V approx 2 weeks prior, and since delivery, episodes have been much more frequent and are not necessarily post-prandial.  Radiates to upper back.  At 6wk post-partem check up, her OB ordered US abd to evaluate for cholelithiasis.  Per prior chart, exam today positive for several small gallstones w/out evidence of cholecystitis.  Pt denies fever, CP, SOB and change in bowels.  Onset hematuria today, but otherwise no urinary sx.  Past Medical History  Diagnosis Date  . Arthritis     with broken bones  . Constipation     treated with diet change  . Headache     migraines  . Hx of chlamydia infection     Past Surgical History  Procedure Date  . Wisdom tooth extraction   . Cesarean section 10/04/2012    Procedure: CESAREAN SECTION;  Surgeon: Bing Plume, MD;  Location: [facility];  Service: Obstetrics;  Laterality: N/A;    Family History  Problem Relation Age of Onset  . Cerebral palsy Mother   . Fibroids Mother   . Diverticulitis Mother   . Migraines Mother   . Asthma Brother   . Cancer Maternal Grandmother     ovarian  . COPD Maternal Grandfather   . Diabetes Maternal Grandfather   . Heart disease Maternal Grandfather     heart valve  . Hypertension Maternal Grandfather   . Cancer Paternal Grandfather     lung    History  Substance Use Topics  . Smoking status: Never Smoker   . Smokeless tobacco: Never Used  . Alcohol Use: No    OB History    Grav Para Term Preterm Abortions TAB SAB Ect Mult Living   1 1 1       1       Review of Systems  All other systems reviewed and are  negative.    Allergies  Peanut-containing drug products and Red dye  Home Medications   Current Outpatient Rx  Name  Route  Sig  Dispense  Refill  . ACETAMINOPHEN 500 MG PO TABS   Oral   Take 1,000 mg by mouth every 6 (six) hours as needed. For pain.         . ALBUTEROL SULFATE HFA 108 (90 BASE) MCG/ACT IN AERS   Inhalation   Inhale 2 puffs into the lungs every 6 (six) hours as needed. For shortness of breath         . FERROUS FUMARATE 325 (106 FE) MG PO TABS   Oral   Take 1 tablet by mouth 2 (two) times daily.         Marland Kitchen PRENATAL MULTIVITAMIN CH   Oral   Take 1 tablet by mouth at bedtime.         Marland Kitchen RANITIDINE HCL 75 MG PO TABS   Oral   Take 75 mg by mouth daily.         Marland Kitchen DICLOXACILLIN SODIUM 500 MG PO CAPS   Oral   Take 500 mg by mouth 3 (three) times daily.           BP 111/80  Pulse 65  Temp 97.7 F (36.5 C)  Resp 20  SpO2 100%  Breastfeeding? Yes  Physical Exam  Nursing note and vitals reviewed. Constitutional: She is oriented to person, place, and time. She appears well-developed and well-nourished. No distress.  HENT:  Head: Normocephalic and atraumatic.  Eyes:       Normal appearance  Neck: Normal range of motion.  Cardiovascular: Normal rate and regular rhythm.   Pulmonary/Chest: Effort normal and breath sounds normal. No respiratory distress.  Abdominal: Soft. Bowel sounds are normal. She exhibits no distension and no mass. There is tenderness. There is guarding. There is no rebound.       Horizontal suprapubic c-section wound healing well.  Epigastric worse than RUQ tenderness w/ negative murphy's sign.   Genitourinary:       No CVA tenderness  Musculoskeletal: Normal range of motion.  Neurological: She is alert and oriented to person, place, and time.  Skin: Skin is warm and dry. No rash noted.  Psychiatric: She has a normal mood and affect. Her behavior is normal.    ED Course  Procedures (including critical care time)  Labs  Reviewed  CBC WITH DIFFERENTIAL - Abnormal; Notable for the following:    RBC 5.28 (*)     MCH 25.8 (*)     All other components within normal limits  COMPREHENSIVE METABOLIC PANEL - Abnormal; Notable for the following:    Glucose, Bld 100 (*)     AST 569 (*)     ALT 552 (*)     Alkaline Phosphatase 356 (*)     Total Bilirubin 2.5 (*)     All other components within normal limits  LIPASE, BLOOD - Abnormal; Notable for the following:    Lipase 78 (*)     All other components within normal limits  URINALYSIS, ROUTINE W REFLEX MICROSCOPIC - Abnormal; Notable for the following:    Color, Urine AMBER (*)  BIOCHEMICALS MAY BE AFFECTED BY COLOR   Bilirubin Urine MODERATE (*)     Ketones, ur TRACE (*)     All other components within normal limits   US Abdomen Complete  11/29/2012  *RADIOLOGY REPORT*  Clinical Data:  Right upper quadrant pain.  COMPLETE ABDOMINAL ULTRASOUND  Comparison:  None  Findings:  Gallbladder:  Multiple small stones.  Gallbladder wall thickness is normal.  Negative sonographic Murphy's sign.  Common bile duct:  Normal.  4.5 mm in diameter.  Liver:  Normal.  IVC:  Normal.  Pancreas:  Normal.  Spleen:  Normal.  9.1 cm in length.  Right Kidney:  Normal.  11.1 cm in length.  Left Kidney:  Normal.  11.0 cm in length.  Abdominal aorta:  Normal.  1.6 cm maximal diameter.  IMPRESSION: Multiple small gallstones.  Tenderness in the right upper quadrant without a Murphy's sign.   Original Report Authenticated By: Francene Boyers, M.D.      1. Cholelithiasis   2. Pancreatitis       MDM  22yo F, 8wks PP, presents w/ epigastric/RUQ pain and N/V x ~10wks, sx gradually worsening.  [place] ordered by OB shows cholelithiasis w/out cholecystitis.  Afebrile and negative murphy's sign on my exam.  Labs pending.  Pt has received IV dilaudid and zofran.  Will reassess shortly.  10:05 PM   Pain controlled but patient continues to vomit.  Will try IV phenergan.  Labs sig for elevated alk  phos/transaminases/total bilirubin/lipase.  General surgery consulted for gallstone pancreatitis and will admit.  10:55  PM        Otilio Miu, PA-C 11/30/12 0500

## 2012-11-29 NOTE — ED Notes (Signed)
Pt had ultrasound today; told had gallstones; c/o mid epigastric pain to right upper quad to back since noon; n/v; 8 wks postpartum

## 2012-11-29 NOTE — ED Notes (Signed)
Pt advised lab test are resulted, PA to consult surgery

## 2012-11-30 ENCOUNTER — Encounter (HOSPITAL_COMMUNITY): Payer: Self-pay

## 2012-11-30 ENCOUNTER — Inpatient Hospital Stay (HOSPITAL_COMMUNITY): Payer: BC Managed Care – PPO

## 2012-11-30 LAB — COMPREHENSIVE METABOLIC PANEL
ALT: 456 U/L — ABNORMAL HIGH (ref 0–35)
Albumin: 3.1 g/dL — ABNORMAL LOW (ref 3.5–5.2)
Alkaline Phosphatase: 299 U/L — ABNORMAL HIGH (ref 39–117)
BUN: 5 mg/dL — ABNORMAL LOW (ref 6–23)
Chloride: 106 mEq/L (ref 96–112)
Potassium: 3.5 mEq/L (ref 3.5–5.1)
Total Bilirubin: 2.1 mg/dL — ABNORMAL HIGH (ref 0.3–1.2)

## 2012-11-30 LAB — CBC
MCH: 25.2 pg — ABNORMAL LOW (ref 26.0–34.0)
MCHC: 31.8 g/dL (ref 30.0–36.0)
MCV: 79.1 fL (ref 78.0–100.0)
Platelets: 206 10*3/uL (ref 150–400)
RDW: 15.2 % (ref 11.5–15.5)
WBC: 6.9 10*3/uL (ref 4.0–10.5)

## 2012-11-30 LAB — URINALYSIS, ROUTINE W REFLEX MICROSCOPIC
Hgb urine dipstick: NEGATIVE
Protein, ur: NEGATIVE mg/dL
Urobilinogen, UA: 0.2 mg/dL (ref 0.0–1.0)

## 2012-11-30 MED ORDER — PANTOPRAZOLE SODIUM 40 MG IV SOLR: 40.0000 mg | Freq: Every day | INTRAVENOUS | Status: DC

## 2012-11-30 MED ORDER — DEXTROSE-NACL 5-0.9 % IV SOLN
INTRAVENOUS | Status: DC
  Administered 2012-11-30 – 2012-12-03 (×9): via INTRAVENOUS

## 2012-11-30 MED ORDER — BIOTENE DRY MOUTH MT LIQD
15.0000 mL | Freq: Two times a day (BID) | OROMUCOSAL | Status: DC
  Administered 2012-11-30 – 2012-12-04 (×6): 15 mL via OROMUCOSAL

## 2012-11-30 MED ORDER — HYDROMORPHONE HCL PF 1 MG/ML IJ SOLN
1.0000 mg | INTRAMUSCULAR | Status: DC | PRN
  Administered 2012-11-30: 0.5 mg via INTRAVENOUS
  Administered 2012-11-30: 1 mg via INTRAVENOUS
  Administered 2012-11-30 (×2): 0.5 mg via INTRAVENOUS
  Administered 2012-11-30: 1 mg via INTRAVENOUS
  Filled 2012-11-30 (×4): qty 1

## 2012-11-30 MED ORDER — ONDANSETRON HCL 4 MG/2ML IJ SOLN
4.0000 mg | Freq: Four times a day (QID) | INTRAMUSCULAR | Status: DC | PRN
  Administered 2012-11-30 – 2012-12-01 (×5): 4 mg via INTRAVENOUS
  Filled 2012-11-30 (×5): qty 2

## 2012-11-30 MED ORDER — HYDROMORPHONE HCL PF 1 MG/ML IJ SOLN
1.0000 mg | INTRAMUSCULAR | Status: DC | PRN
Start: 2012-11-30 — End: 2012-12-01
  Administered 2012-11-30 – 2012-12-01 (×6): 1 mg via INTRAVENOUS
  Filled 2012-11-30 (×6): qty 1

## 2012-11-30 MED ORDER — ALBUTEROL SULFATE HFA 108 (90 BASE) MCG/ACT IN AERS
2.0000 | INHALATION_SPRAY | Freq: Four times a day (QID) | RESPIRATORY_TRACT | Status: DC | PRN
  Filled 2012-11-30: qty 6.7

## 2012-11-30 MED ORDER — CHLORHEXIDINE GLUCONATE 0.12 % MT SOLN
15.0000 mL | Freq: Two times a day (BID) | OROMUCOSAL | Status: DC
  Administered 2012-11-30 – 2012-12-06 (×9): 15 mL via OROMUCOSAL
  Filled 2012-11-30 (×15): qty 15

## 2012-11-30 NOTE — Progress Notes (Signed)
Subjective: Pt feeling a bit better today, pain still moderate but controlled with meds.  Pt has been getting nausea medicine frequently, but well controlled at this time.  Pt ambulating to the BR and using IS.    Objective: Vital signs in last 24 hours: Temp:  [97.7 F (36.5 C)-98.2 F (36.8 C)] 98.2 F (36.8 C) (01/16 0955) Pulse Rate:  [62-69] 69  (01/16 0955) Resp:  [16-20] 16  (01/16 0955) BP: (111-141)/(77-89) 120/78 mmHg (01/16 0955) SpO2:  [97 %-100 %] 97 % (01/16 0955) Weight:  [147 lb 4.3 oz (66.8 kg)] 147 lb 4.3 oz (66.8 kg) (01/16 0103) Last BM Date: 11/28/12  Intake/Output from previous day: 01/15 0701 - 01/16 0700 In: 2510 [I.V.:2510] Out: 200 [Urine:200] Intake/Output this shift: Total I/O In: -  Out: 550 [Urine:550]  PE: Gen:  Alert, NAD, pleasant Abd: Soft, moderate tenderness in RUQ and epigastric area, mild distension, +BS, no HSM   Lab Results:   Basename 11/30/12 0400 11/29/12 2100  WBC 6.9 9.3  HGB 11.7* 13.6  HCT 36.8 41.2  PLT 206 270   BMET  Basename 11/30/12 0400 11/29/12 2100  NA 141 140  K 3.5 3.7  CL 106 100  CO2 27 28  GLUCOSE 82 100*  BUN 5* 8  CREATININE 0.69 0.66  CALCIUM 8.9 9.6   PT/INR No results found for this basename: LABPROT:2,INR:2 in the last 72 hours CMP     Component Value Date/Time   NA 141 11/30/2012 0400   K 3.5 11/30/2012 0400   CL 106 11/30/2012 0400   CO2 27 11/30/2012 0400   GLUCOSE 82 11/30/2012 0400   BUN 5* 11/30/2012 0400   CREATININE 0.69 11/30/2012 0400   CALCIUM 8.9 11/30/2012 0400   PROT 6.1 11/30/2012 0400   ALBUMIN 3.1* 11/30/2012 0400   AST 408* 11/30/2012 0400   ALT 456* 11/30/2012 0400   ALKPHOS 299* 11/30/2012 0400   BILITOT 2.1* 11/30/2012 0400   GFRNONAA >90 11/30/2012 0400   GFRAA >90 11/30/2012 0400   Lipase     Component Value Date/Time   LIPASE 78* 11/29/2012 2100       Studies/Results: US Abdomen Complete  11/29/2012  *RADIOLOGY REPORT*  Clinical Data:  Right upper quadrant  pain.  COMPLETE ABDOMINAL ULTRASOUND  Comparison:  None  Findings:  Gallbladder:  Multiple small stones.  Gallbladder wall thickness is normal.  Negative sonographic Murphy's sign.  Common bile duct:  Normal.  4.5 mm in diameter.  Liver:  Normal.  IVC:  Normal.  Pancreas:  Normal.  Spleen:  Normal.  9.1 cm in length.  Right Kidney:  Normal.  11.1 cm in length.  Left Kidney:  Normal.  11.0 cm in length.  Abdominal aorta:  Normal.  1.6 cm maximal diameter.  IMPRESSION: Multiple small gallstones.  Tenderness in the right upper quadrant without a Murphy's sign.   Original Report Authenticated By: Francene Boyers, M.D.     Anti-infectives: Anti-infectives     Start     Dose/Rate Route Frequency Ordered Stop   11/29/12 2300   azithromycin (ZITHROMAX) tablet 1,000 mg  Status:  Discontinued        1,000 mg Oral  Once 11/29/12 2252 11/29/12 2254           Assessment/Plan Gallstone pancreatitis  Elevate LFTs and lipase 8 Weeks post partum Urinary urgency and dark urine (UTI?)  Plan: -Inc IVF to , pain control -Check UA, may have UTI -SCD's, ambulation -LFT's improved today, not ready  for surgery, but will recheck labs in am  -Cont NPO  -Consulted [facility]-May need ERCP preop vs Lap chole IOC once pancreatitis improves     LOS: 1 day    Valerie Cortez, Valerie Cortez 11/30/2012, 12:41 PM Pager: [phone]

## 2012-11-30 NOTE — Progress Notes (Signed)
Pt with redness noted to left arm. Pt also c/o pain inbetween doses of dilaudid. Called MD on call awaiting call back

## 2012-11-30 NOTE — Progress Notes (Signed)
I have seen and examined the patient and agree with the assessment and plans.  For MRCP later today.  Plans after that.  Markevius Trombetta A. Magnus Ivan  MD, FACS

## 2012-11-30 NOTE — Progress Notes (Signed)
Utilization review completed.  

## 2012-11-30 NOTE — Progress Notes (Signed)
Pt back from MRI 

## 2012-11-30 NOTE — Progress Notes (Signed)
Pt down for MRI.

## 2012-11-30 NOTE — Consult Note (Signed)
Referring Provider: Dr. Estelle Grumbles Primary Care Physician:  No primary provider on file. Primary Gastroenterologist:  None  Reason for Consultation:  Elevated liver chemistries and gallstones, ? CBD stone  HPI: Valerie Cortez is a 22 y.o. female who is 8 weeks postpartum and had been having upper abdominal pain which intensified over the past week or so and was admitted through the emergency room yesterday with an ultrasound showing multiple small gallstones but no gallbladder wall thickening, a 4.5 mm common bile duct, and elevated liver chemistries with transaminases in the 500 range and total bilirubin of 2.5. These values have declined about 20% overnight. Her hemoglobin has dropped slightly overnight, of pertinence since she had a slightly elevated lipase level of 78 at the time of admission (not repeated today).   Past Medical History  Diagnosis Date  . Arthritis     with broken bones  . Constipation     treated with diet change  . Headache     migraines  . Hx of chlamydia infection   . Difficult intubation     Past Surgical History  Procedure Date  . Wisdom tooth extraction   . Cesarean section 10/04/2012    Procedure: CESAREAN SECTION;  Surgeon: Bing Plume, MD;  Location: [facility];  Service: Obstetrics;  Laterality: N/A;    Prior to Admission medications   Medication Sig Start Date End Date Taking? Authorizing Provider  acetaminophen (TYLENOL) 500 MG tablet Take 1,000 mg by mouth every 6 (six) hours as needed. For pain.   Yes Historical Provider, MD  albuterol (PROVENTIL HFA;VENTOLIN HFA) 108 (90 BASE) MCG/ACT inhaler Inhale 2 puffs into the lungs every 6 (six) hours as needed. For shortness of breath   Yes Historical Provider, MD  ferrous fumarate (HEMOCYTE - 106 MG FE) 325 (106 FE) MG TABS Take 1 tablet by mouth 2 (two) times daily.   Yes Historical Provider, MD  Prenatal Vit-Fe Fumarate-FA (PRENATAL MULTIVITAMIN) TABS Take 1 tablet by mouth at bedtime.   Yes Historical  Provider, MD  ranitidine (ZANTAC) 75 MG tablet Take 75 mg by mouth daily.   Yes Historical Provider, MD  dicloxacillin (DYNAPEN) 500 MG capsule Take 500 mg by mouth 3 (three) times daily.    Historical Provider, MD    Current Facility-Administered Medications  Medication Dose Route Frequency Provider Last Rate Last Dose  . albuterol (PROVENTIL HFA;VENTOLIN HFA) 108 (90 BASE) MCG/ACT inhaler 2 puff  2 puff Inhalation Q6H PRN Thomas A. Cornett, MD      . antiseptic oral rinse (BIOTENE) solution 15 mL  15 mL Mouth Rinse q12n4p Thomas A. Cornett, MD   15 mL at 11/30/12 1545  . chlorhexidine (PERIDEX) 0.12 % solution 15 mL  15 mL Mouth Rinse BID Thomas A. Cornett, MD   15 mL at 11/30/12 0748  . dextrose 5 %-0.9 % sodium chloride infusion   Intravenous Continuous Megan Dort, PA-C 125 mL/hr at 11/30/12 1317    . HYDROmorphone (DILAUDID) injection 1 mg  1 mg Intravenous Q2H PRN Thomas A. Cornett, MD   0.5 mg at 11/30/12 1750  . ondansetron (ZOFRAN) injection 4 mg  4 mg Intravenous Q6H PRN Thomas A. Cornett, MD   4 mg at 11/30/12 1750    Allergies as of 11/29/2012 - Review Complete 11/29/2012  Allergen Reaction Noted  . Peanut-containing drug products Hives 08/21/2012  . Red dye Nausea And Vomiting 08/21/2012    Family History  Problem Relation Age of Onset  . Cerebral palsy  Mother   . Fibroids Mother   . Diverticulitis Mother   . Migraines Mother   . Asthma Brother   . Cancer Maternal Grandmother     ovarian  . COPD Maternal Grandfather   . Diabetes Maternal Grandfather   . Heart disease Maternal Grandfather     heart valve  . Hypertension Maternal Grandfather   . Cancer Paternal Grandfather     lung    History   Social History  . Marital Status: Married    Spouse Name: N/A    Number of Children: N/A  . Years of Education: N/A   Occupational History  . Not on file.   Social History Main Topics  . Smoking status: Never Smoker   . Smokeless tobacco: Never Used  . Alcohol  Use: No  . Drug Use: No  . Sexually Active: Not Currently    Birth Control/ Protection: None   Other Topics Concern  . Not on file   Social History Narrative  . No narrative on file     Physical Exam: Vital signs in last 24 hours: Temp:  [97.7 F (36.5 C)-98.2 F (36.8 C)] 97.7 F (36.5 C) (01/16 1355) Pulse Rate:  [62-69] 62  (01/16 1355) Resp:  [16-20] 16  (01/16 1355) BP: (105-141)/(65-89) 105/65 mmHg (01/16 1355) SpO2:  [97 %-100 %] 98 % (01/16 1355) Weight:  [66.8 kg (147 lb 4.3 oz)] 66.8 kg (147 lb 4.3 oz) (01/16 0103) Last BM Date: 11/28/12 General:   Alert,  Well-developed, well-nourished, pleasant and cooperative in NAD Head:  Normocephalic and atraumatic. Eyes:  Sclera clear, no icterus.   Lungs:  Clear throughout to auscultation.   No wheezes, crackles, or rhonchi. No evident respiratory distress. Heart:   Regular rate and rhythm; no murmurs, clicks, rubs,  or gallops. Abdomen:  Pertinent for slight right upper quadrant tenderness, nothing impressive. Soft and nondistended. No masses, hepatosplenomegaly or ventral hernias noted. Normal bowel sounds, without bruits, guarding, or rebound.   Msk:   Symmetrical without gross deformities. Neurologic:  Alert and coherent;  grossly normal neurologically. Psych:   Alert and cooperative. Normal mood and affect.  Intake/Output from previous day: 01/15 0701 - 01/16 0700 In: 2510 [I.V.:2510] Out: 200 [Urine:200] Intake/Output this shift: Total I/O In: -  Out: 650 [Urine:650]  Lab Results:  [facility] 11/30/12 0400 11/29/12 2100  WBC 6.9 9.3  HGB 11.7* 13.6  HCT 36.8 41.2  PLT 206 270   BMET  Basename 11/30/12 0400 11/29/12 2100  NA 141 140  K 3.5 3.7  CL 106 100  CO2 27 28  GLUCOSE 82 100*  BUN 5* 8  CREATININE 0.69 0.66  CALCIUM 8.9 9.6   LFT  Basename 11/30/12 0400  PROT 6.1  ALBUMIN 3.1*  AST 408*  ALT 456*  ALKPHOS 299*  BILITOT 2.1*  BILIDIR --  IBILI --   PT/INR No results found for this  basename: LABPROT:2,INR:2 in the last 72 hours  Studies/Results: US Abdomen Complete  11/29/2012  *RADIOLOGY REPORT*  Clinical Data:  Right upper quadrant pain.  COMPLETE ABDOMINAL ULTRASOUND  Comparison:  None  Findings:  Gallbladder:  Multiple small stones.  Gallbladder wall thickness is normal.  Negative sonographic Murphy's sign.  Common bile duct:  Normal.  4.5 mm in diameter.  Liver:  Normal.  IVC:  Normal.  Pancreas:  Normal.  Spleen:  Normal.  9.1 cm in length.  Right Kidney:  Normal.  11.1 cm in length.  Left Kidney:  Normal.  11.0 cm in length.  Abdominal aorta:  Normal.  1.6 cm maximal diameter.  IMPRESSION: Multiple small gallstones.  Tenderness in the right upper quadrant without a Murphy's sign.   Original Report Authenticated By: Francene Boyers, M.D.     Impression: It is unclear whether this patient has choledocholithiasis account for her ongoing pain and elevated liver chemistries. I think this is the most likely scenario, but the small caliber common bile duct goes somewhat against it.  Plan: Start with MRI and MRCP, which should help to confirm whether there is significant gallbladder inflammation to account for her ongoing pain, and also should help to detect common bile duct stones. Depending on those findings and the patient's clinical and laboratory evolution, preoperative ERCP may be appropriate. It is noted that the surgeons indicated to me there will be a delay of several days before her cholecystectomy is performed.   LOS: 1 day   Shylah Dossantos V  11/30/2012, 6:10 PM

## 2012-12-01 DIAGNOSIS — K5900 Constipation, unspecified: Secondary | ICD-10-CM | POA: Diagnosis present

## 2012-12-01 DIAGNOSIS — K851 Biliary acute pancreatitis without necrosis or infection: Secondary | ICD-10-CM | POA: Diagnosis present

## 2012-12-01 LAB — LIPASE, BLOOD: Lipase: 46 U/L (ref 11–59)

## 2012-12-01 LAB — COMPREHENSIVE METABOLIC PANEL
ALT: 306 U/L — ABNORMAL HIGH (ref 0–35)
Alkaline Phosphatase: 293 U/L — ABNORMAL HIGH (ref 39–117)
BUN: 4 mg/dL — ABNORMAL LOW (ref 6–23)
CO2: 28 mEq/L (ref 19–32)
Chloride: 104 mEq/L (ref 96–112)
GFR calc Af Amer: >90 mL/min (ref >90)
GFR calc non Af Amer: >90 mL/min (ref >90)
Glucose, Bld: 78 mg/dL (ref 70–99)
Potassium: 3.5 mEq/L (ref 3.5–5.1)
Sodium: 140 mEq/L (ref 135–145)
Total Bilirubin: 1.2 mg/dL (ref 0.3–1.2)
Total Protein: 6.2 g/dL (ref 6.0–8.3)

## 2012-12-01 LAB — CBC
HCT: 36 % (ref 36.0–46.0)
MCH: 25.2 pg — ABNORMAL LOW (ref 26.0–34.0)
MCV: 80.4 fL (ref 78.0–100.0)
Platelets: 215 10*3/uL (ref 150–400)
RDW: 15.3 % (ref 11.5–15.5)

## 2012-12-01 MED ORDER — ONDANSETRON 8 MG/NS 50 ML IVPB: 8.0000 mg | Freq: Four times a day (QID) | INTRAVENOUS | Status: DC | PRN

## 2012-12-01 MED ORDER — DIPHENHYDRAMINE HCL 25 MG PO CAPS
25.0000 mg | ORAL_CAPSULE | Freq: Four times a day (QID) | ORAL | Status: DC | PRN
  Administered 2012-12-03: 25 mg via ORAL
  Filled 2012-12-01 (×2): qty 1

## 2012-12-01 MED ORDER — HYDROMORPHONE HCL PF 1 MG/ML IJ SOLN
0.5000 mg | INTRAMUSCULAR | Status: DC | PRN
  Administered 2012-12-01: 0.5 mg via INTRAVENOUS
  Administered 2012-12-01 – 2012-12-05 (×33): 1 mg via INTRAVENOUS
  Filled 2012-12-01 (×33): qty 1

## 2012-12-01 MED ORDER — PROMETHAZINE HCL 25 MG/ML IJ SOLN
12.5000 mg | Freq: Four times a day (QID) | INTRAMUSCULAR | Status: DC | PRN
  Administered 2012-12-01: 25 mg via INTRAVENOUS
  Administered 2012-12-01 – 2012-12-05 (×7): 12.5 mg via INTRAVENOUS
  Filled 2012-12-01 (×6): qty 1

## 2012-12-01 MED ORDER — ONDANSETRON HCL 4 MG/2ML IJ SOLN: 4.0000 mg | Freq: Four times a day (QID) | INTRAMUSCULAR | Status: DC | PRN

## 2012-12-01 MED ORDER — FENTANYL CITRATE 0.05 MG/ML IJ SOLN
25.0000 ug | INTRAMUSCULAR | Status: DC | PRN
  Administered 2012-12-01: 25 ug via INTRAVENOUS
  Filled 2012-12-01 (×2): qty 2

## 2012-12-01 MED ORDER — DEXTROSE 5 % AND 0.9 % NACL IV BOLUS
500.0000 mL | Freq: Once | INTRAVENOUS | Status: AC
  Administered 2012-12-01: 500 mL via INTRAVENOUS

## 2012-12-01 MED ORDER — HYDROMORPHONE HCL PF 1 MG/ML IJ SOLN: 0.5000 mg | INTRAMUSCULAR | Status: DC | PRN

## 2012-12-01 MED ORDER — BISACODYL 10 MG RE SUPP: 10.0000 mg | Freq: Two times a day (BID) | RECTAL | Status: DC | PRN

## 2012-12-01 MED ORDER — METOCLOPRAMIDE HCL 5 MG/ML IJ SOLN
5.0000 mg | Freq: Four times a day (QID) | INTRAMUSCULAR | Status: DC | PRN
  Administered 2012-12-02: 10 mg via INTRAVENOUS
  Filled 2012-12-01: qty 2

## 2012-12-01 MED ORDER — ALUM & MAG HYDROXIDE-SIMETH 200-200-20 MG/5ML PO SUSP
30.0000 mL | Freq: Four times a day (QID) | ORAL | Status: DC | PRN
  Administered 2012-12-02 – 2012-12-06 (×8): 30 mL via ORAL
  Filled 2012-12-01 (×8): qty 30

## 2012-12-01 MED ORDER — MAGIC MOUTHWASH
15.0000 mL | Freq: Four times a day (QID) | ORAL | Status: DC | PRN
  Administered 2012-12-01 – 2012-12-06 (×2): 15 mL via ORAL
  Filled 2012-12-01 (×2): qty 15

## 2012-12-01 MED ORDER — DIPHENHYDRAMINE HCL 50 MG/ML IJ SOLN
12.5000 mg | Freq: Four times a day (QID) | INTRAMUSCULAR | Status: DC | PRN
  Administered 2012-12-01 – 2012-12-02 (×2): 12.5 mg via INTRAVENOUS
  Filled 2012-12-01 (×2): qty 1

## 2012-12-01 MED ORDER — PROMETHAZINE HCL 25 MG/ML IJ SOLN
12.5000 mg | Freq: Once | INTRAMUSCULAR | Status: AC
  Administered 2012-12-01: 12.5 mg via INTRAVENOUS
  Filled 2012-12-01: qty 1

## 2012-12-01 MED ORDER — PROMETHAZINE HCL 25 MG/ML IJ SOLN: 12.5000 mg | Freq: Four times a day (QID) | INTRAMUSCULAR | Status: DC | PRN

## 2012-12-01 MED ORDER — ACETAMINOPHEN 650 MG RE SUPP: 650.0000 mg | Freq: Four times a day (QID) | RECTAL | Status: DC | PRN

## 2012-12-01 MED ORDER — ACETAMINOPHEN 325 MG PO TABS
325.0000 mg | ORAL_TABLET | Freq: Four times a day (QID) | ORAL | Status: DC | PRN
  Administered 2012-12-05 – 2012-12-06 (×2): 650 mg via ORAL
  Filled 2012-12-01 (×2): qty 2

## 2012-12-01 MED ORDER — LIP MEDEX EX OINT
1.0000 "application " | TOPICAL_OINTMENT | Freq: Two times a day (BID) | CUTANEOUS | Status: DC
  Administered 2012-12-01 – 2012-12-06 (×10): 1 via TOPICAL

## 2012-12-01 MED ORDER — OXYCODONE HCL 5 MG PO TABS
5.0000 mg | ORAL_TABLET | ORAL | Status: DC | PRN
  Administered 2012-12-02 – 2012-12-03 (×2): 10 mg via ORAL
  Filled 2012-12-01 (×2): qty 2

## 2012-12-01 MED ORDER — SODIUM CHLORIDE 0.9 % IV SOLN
3.0000 g | INTRAVENOUS | Status: AC
  Filled 2012-12-01: qty 3

## 2012-12-01 MED ORDER — BLISTEX EX OINT
TOPICAL_OINTMENT | CUTANEOUS | Status: AC
  Administered 2012-12-01: 1 via TOPICAL
  Filled 2012-12-01: qty 10

## 2012-12-01 NOTE — Progress Notes (Addendum)
Valerie Cortez [id number] 07-21-1991   Subjective:  MRCP done last night Still sore Very nauseated / retching - zofran not helping enough.  Phenergan has worked better in past during pregnancy/delivery Using Dilaudid but makes her nauseated & groggy RN in room   Objective:  Vital signs:  Filed Vitals:   11/30/12 1815 11/30/12 2034 12/01/12 0148 12/01/12 0643  BP: 123/83 128/86 105/68 127/75  Pulse: 60 58 66 53  Temp: 97.9 F (36.6 C) 98.2 F (36.8 C) 98.1 F (36.7 C) 97.9 F (36.6 C)  TempSrc:  Oral Oral Oral  Resp: 16 14 14 14   Height:      Weight:      SpO2: 98% 99% 94% 96%    Last BM Date: 11/28/12  Intake/Output   Yesterday:  01/16 0701 - 01/17 0700 In: 2817.9 [I.V.:2817.9] Out: 1500 [Urine:1500] This shift:     Bowel function:  Flatus: y  BM: n  Physical Exam:  General: Pt awake/alert/oriented x4 in no acute distress.  Tired but not toxic Husband in room Eyes: PERRL, normal EOM.  Sclera clear.  No icterus Neuro: CN II-XII intact w/o focal sensory/motor deficits. Lymph: No head/neck/groin lymphadenopathy Psych:  No delerium/psychosis/paranoia HENT: Normocephalic, Mucus membranes moist.  No thrush Neck: Supple, No tracheal deviation Chest: No chest wall pain w good excursion CV:  Pulses intact.  Regular rhythm MS: Normal AROM mjr joints.  No obvious deformity Abdomen: Soft.  Nondistended.  Mildl tender BUQ diffuse.  No peritonitis.  No incarcerated hernias. Ext:  SCDs BLE.  No mjr edema.  No cyanosis Skin: No petechiae / purpurae  Problem List:  Principal Problem:  *Gallstone pancreatitis   Assessment  Valerie Cortez  22 y.o. female       Persistent pain/nausea c/w persistent pancreatitis but LFTs better a hopeful sign that prob CBD stone is passing  Plan:  -f/u MRCP - no radiologist available to read yet but probably in a few hours.  May need ERCP if + given persistent pain despite better labs -would hold off on lap chole today, possibly in  1-2 days if MRCP OK & pain less. -cont IV ABx for now -VTE prophylaxis- SCDs, etc -mobilize as tolerated to help recovery  ADDENDUM:  D/w Dr. Rolena Infante with Radiology.  MRCP shows no obvious stones/biliary obstruction.  No severe pancreatitis.  Probably will not need ERCP preop but see what GI thinks.  With persistent pain/n/v would wait until tomorrow to let pancreatitis calm down more.  Valerie Cortez, M.D., F.A.C.S. Gastrointestinal and Minimally Invasive Surgery [facility] [address] [place], [place] [address] [phone] Main / Paging [phone] Voice Mail   12/01/2012  CARE TEAM:  PCP: No primary provider on file.  Outpatient Care Team: Patient has no care team.  Inpatient Treatment Team: Treatment Team: Attending Provider: Md Montez Morita, MD; Technician: Justice Britain, NT; Technician: Velvet Bathe, NT; Registered Nurse: Nira Conn, RN; Attending Physician: Florencia Reasons, MD; Registered Nurse: Barrie Lyme, RN   Results:   Labs: Results for orders placed during the hospital encounter of 11/29/12 (from the past 48 hour(s))  CBC WITH DIFFERENTIAL     Status: Abnormal   Collection Time   11/29/12  9:00 PM      Component Value Range Comment   WBC 9.3  4.0 - 10.5 K/uL    RBC 5.28 (*) 3.87 - 5.11 MIL/uL    Hemoglobin 13.6  12.0 - 15.0 g/dL  HCT 41.2  36.0 - 46.0 %    MCV 78.0  78.0 - 100.0 fL    MCH 25.8 (*) 26.0 - 34.0 pg    MCHC 33.0  30.0 - 36.0 g/dL    RDW 16.1  09.6 - 04.5 %    Platelets 270  150 - 400 K/uL    Neutrophils Relative 66  43 - 77 %    Neutro Abs 6.2  1.7 - 7.7 K/uL    Lymphocytes Relative 22  12 - 46 %    Lymphs Abs 2.1  0.7 - 4.0 K/uL    Monocytes Relative 8  3 - 12 %    Monocytes Absolute 0.7  0.1 - 1.0 K/uL    Eosinophils Relative 2  0 - 5 %    Eosinophils Absolute 0.2  0.0 - 0.7 K/uL    Basophils Relative 1  0 - 1 %    Basophils Absolute 0.1  0.0 - 0.1 K/uL   COMPREHENSIVE METABOLIC PANEL      Status: Abnormal   Collection Time   11/29/12  9:00 PM      Component Value Range Comment   Sodium 140  135 - 145 mEq/L    Potassium 3.7  3.5 - 5.1 mEq/L    Chloride 100  96 - 112 mEq/L    CO2 28  19 - 32 mEq/L    Glucose, Bld 100 (*) 70 - 99 mg/dL    BUN 8  6 - 23 mg/dL    Creatinine, Ser 4.09  0.50 - 1.10 mg/dL    Calcium 9.6  8.4 - 81.1 mg/dL    Total Protein 7.9  6.0 - 8.3 g/dL    Albumin 4.2  3.5 - 5.2 g/dL    AST 914 (*) 0 - 37 U/L    ALT 552 (*) 0 - 35 U/L    Alkaline Phosphatase 356 (*) 39 - 117 U/L    Total Bilirubin 2.5 (*) 0.3 - 1.2 mg/dL    GFR calc non Af Amer >90  >90 mL/min    GFR calc Af Amer >90  >90 mL/min   LIPASE, BLOOD     Status: Abnormal   Collection Time   11/29/12  9:00 PM      Component Value Range Comment   Lipase 78 (*) 11 - 59 U/L   URINALYSIS, ROUTINE W REFLEX MICROSCOPIC     Status: Abnormal   Collection Time   11/29/12  9:26 PM      Component Value Range Comment   Color, Urine AMBER (*) YELLOW BIOCHEMICALS MAY BE AFFECTED BY COLOR   APPearance CLEAR  CLEAR    Specific Gravity, Urine 1.015  1.005 - 1.030    pH 8.0  5.0 - 8.0    Glucose, UA NEGATIVE  NEGATIVE mg/dL    Hgb urine dipstick NEGATIVE  NEGATIVE    Bilirubin Urine MODERATE (*) NEGATIVE    Ketones, ur TRACE (*) NEGATIVE mg/dL    Protein, ur NEGATIVE  NEGATIVE mg/dL    Urobilinogen, UA 0.2  0.0 - 1.0 mg/dL    Nitrite NEGATIVE  NEGATIVE    Leukocytes, UA NEGATIVE  NEGATIVE MICROSCOPIC NOT DONE ON URINES WITH NEGATIVE PROTEIN, BLOOD, LEUKOCYTES, NITRITE, OR GLUCOSE <1000 mg/dL.  COMPREHENSIVE METABOLIC PANEL     Status: Abnormal   Collection Time   11/30/12  4:00 AM      Component Value Range Comment   Sodium 141  135 - 145 mEq/L  Potassium 3.5  3.5 - 5.1 mEq/L    Chloride 106  96 - 112 mEq/L    CO2 27  19 - 32 mEq/L    Glucose, Bld 82  70 - 99 mg/dL    BUN 5 (*) 6 - 23 mg/dL    Creatinine, Ser 1.61  0.50 - 1.10 mg/dL    Calcium 8.9  8.4 - 09.6 mg/dL    Total Protein 6.1  6.0 -  8.3 g/dL    Albumin 3.1 (*) 3.5 - 5.2 g/dL    AST 045 (*) 0 - 37 U/L    ALT 456 (*) 0 - 35 U/L    Alkaline Phosphatase 299 (*) 39 - 117 U/L    Total Bilirubin 2.1 (*) 0.3 - 1.2 mg/dL    GFR calc non Af Amer >90  >90 mL/min    GFR calc Af Amer >90  >90 mL/min   CBC     Status: Abnormal   Collection Time   11/30/12  4:00 AM      Component Value Range Comment   WBC 6.9  4.0 - 10.5 K/uL    RBC 4.65  3.87 - 5.11 MIL/uL    Hemoglobin 11.7 (*) 12.0 - 15.0 g/dL    HCT 40.9  81.1 - 91.4 %    MCV 79.1  78.0 - 100.0 fL    MCH 25.2 (*) 26.0 - 34.0 pg    MCHC 31.8  30.0 - 36.0 g/dL    RDW 78.2  95.6 - 21.3 %    Platelets 206  150 - 400 K/uL   MRSA PCR SCREENING     Status: Normal   Collection Time   11/30/12  4:05 AM      Component Value Range Comment   MRSA by PCR NEGATIVE  NEGATIVE   URINALYSIS, ROUTINE W REFLEX MICROSCOPIC     Status: Abnormal   Collection Time   11/30/12  1:54 PM      Component Value Range Comment   Color, Urine YELLOW  YELLOW    APPearance CLEAR  CLEAR    Specific Gravity, Urine 1.014  1.005 - 1.030    pH 6.0  5.0 - 8.0    Glucose, UA NEGATIVE  NEGATIVE mg/dL    Hgb urine dipstick NEGATIVE  NEGATIVE    Bilirubin Urine MODERATE (*) NEGATIVE    Ketones, ur TRACE (*) NEGATIVE mg/dL    Protein, ur NEGATIVE  NEGATIVE mg/dL    Urobilinogen, UA 0.2  0.0 - 1.0 mg/dL    Nitrite NEGATIVE  NEGATIVE    Leukocytes, UA NEGATIVE  NEGATIVE MICROSCOPIC NOT DONE ON URINES WITH NEGATIVE PROTEIN, BLOOD, LEUKOCYTES, NITRITE, OR GLUCOSE <1000 mg/dL.  CBC     Status: Abnormal   Collection Time   12/01/12  4:00 AM      Component Value Range Comment   WBC 5.4  4.0 - 10.5 K/uL    RBC 4.48  3.87 - 5.11 MIL/uL    Hemoglobin 11.3 (*) 12.0 - 15.0 g/dL    HCT 08.6  57.8 - 46.9 %    MCV 80.4  78.0 - 100.0 fL    MCH 25.2 (*) 26.0 - 34.0 pg    MCHC 31.4  30.0 - 36.0 g/dL    RDW 62.9  52.8 - 41.3 %    Platelets 215  150 - 400 K/uL   COMPREHENSIVE METABOLIC PANEL     Status: Abnormal    Collection Time   12/01/12  4:00 AM  Component Value Range Comment   Sodium 140  135 - 145 mEq/L    Potassium 3.5  3.5 - 5.1 mEq/L    Chloride 104  96 - 112 mEq/L    CO2 28  19 - 32 mEq/L    Glucose, Bld 78  70 - 99 mg/dL    BUN 4 (*) 6 - 23 mg/dL    Creatinine, Ser 1.19  0.50 - 1.10 mg/dL    Calcium 9.1  8.4 - 14.7 mg/dL    Total Protein 6.2  6.0 - 8.3 g/dL    Albumin 3.1 (*) 3.5 - 5.2 g/dL    AST 829 (*) 0 - 37 U/L    ALT 306 (*) 0 - 35 U/L    Alkaline Phosphatase 293 (*) 39 - 117 U/L    Total Bilirubin 1.2  0.3 - 1.2 mg/dL    GFR calc non Af Amer >90  >90 mL/min    GFR calc Af Amer >90  >90 mL/min   LIPASE, BLOOD     Status: Normal   Collection Time   12/01/12  4:00 AM      Component Value Range Comment   Lipase 46  11 - 59 U/L     Imaging / Studies: US Abdomen Complete  11/29/2012  *RADIOLOGY REPORT*  Clinical Data:  Right upper quadrant pain.  COMPLETE ABDOMINAL ULTRASOUND  Comparison:  None  Findings:  Gallbladder:  Multiple small stones.  Gallbladder wall thickness is normal.  Negative sonographic Murphy's sign.  Common bile duct:  Normal.  4.5 mm in diameter.  Liver:  Normal.  IVC:  Normal.  Pancreas:  Normal.  Spleen:  Normal.  9.1 cm in length.  Right Kidney:  Normal.  11.1 cm in length.  Left Kidney:  Normal.  11.0 cm in length.  Abdominal aorta:  Normal.  1.6 cm maximal diameter.  IMPRESSION: Multiple small gallstones.  Tenderness in the right upper quadrant without a Murphy's sign.   Original Report Authenticated By: Francene Boyers, M.D.     Medications / Allergies: per chart  Antibiotics: Anti-infectives     Start     Dose/Rate Route Frequency Ordered Stop   12/01/12 0730  Ampicillin-Sulbactam (UNASYN) 3 g in sodium chloride 0.9 % 100 mL IVPB       3 g 100 mL/hr over 60 Minutes Intravenous On call to O.R. 12/01/12 0700 12/02/12 0559   11/29/12 2300   azithromycin (ZITHROMAX) tablet 1,000 mg  Status:  Discontinued        1,000 mg Oral  Once 11/29/12 2252  11/29/12 2254

## 2012-12-01 NOTE — Progress Notes (Signed)
Pt has not voided since around 1000 today. I bladder scanned her as she is "feels like she has to go, but can't". She had 346 mL. MD made aware and orders received.

## 2012-12-01 NOTE — Progress Notes (Signed)
Subjective: Unable to eat due to pain. Pain persists.  Objective: Vital signs in last 24 hours: Temp:  [97.7 F (36.5 C)-98.2 F (36.8 C)] 97.9 F (36.6 C) (01/17 0643) Pulse Rate:  [53-69] 53  (01/17 0643) Resp:  [14-16] 14  (01/17 0643) BP: (105-128)/(65-86) 127/75 mmHg (01/17 0643) SpO2:  [94 %-99 %] 96 % (01/17 0643) Weight change:  Last BM Date: 11/28/12  PE: GEN:  NAD ABD:  Soft, mild diffuse tenderness without peritonitis  Lab Results: CMP     Component Value Date/Time   NA 140 12/01/2012 0400   K 3.5 12/01/2012 0400   CL 104 12/01/2012 0400   CO2 28 12/01/2012 0400   GLUCOSE 78 12/01/2012 0400   BUN 4* 12/01/2012 0400   CREATININE 0.72 12/01/2012 0400   CALCIUM 9.1 12/01/2012 0400   PROT 6.2 12/01/2012 0400   ALBUMIN 3.1* 12/01/2012 0400   AST 129* 12/01/2012 0400   ALT 306* 12/01/2012 0400   ALKPHOS 293* 12/01/2012 0400   BILITOT 1.2 12/01/2012 0400   GFRNONAA >90 12/01/2012 0400   GFRAA >90 12/01/2012 0400   MRI/MRCP:  Cholelithiasis with mild gallbladder wall thickening; non-dilated biliary tree; no choledocholithiasis  Assessment:  1.  Abdominal pain with cholelithiasis. 2.  Elevated LFTs, but downtrending.  Suspect passed bile duct stone.  Plan:  1.  Symptomatic treatment for abdominal pain. 2.  Further management of gallstones per CCS. 3.  No role for ERCP at this time. 4.  Will sign-off; please call with questions.   Freddy Jaksch 12/01/2012, 8:40 AM

## 2012-12-01 NOTE — Progress Notes (Signed)
Patient informed RN that she experienced a "flushed feeling" and felt "itchy" after receiving the fentanyl.  She stated that the itchy feeling was going away at this point because she had not received it lately.  Added the information to her allergy history.  Informed Will Marlyne Beards, PA.  Benadryl ordered.  Will continue to monitor.  Barrie Lyme 6:16 PM 12/01/2012

## 2012-12-02 ENCOUNTER — Inpatient Hospital Stay (HOSPITAL_COMMUNITY): Payer: BC Managed Care – PPO | Admitting: Certified Registered Nurse Anesthetist

## 2012-12-02 ENCOUNTER — Encounter (HOSPITAL_COMMUNITY): Payer: Self-pay | Admitting: Certified Registered Nurse Anesthetist

## 2012-12-02 ENCOUNTER — Inpatient Hospital Stay (HOSPITAL_COMMUNITY): Payer: BC Managed Care – PPO

## 2012-12-02 ENCOUNTER — Encounter (HOSPITAL_COMMUNITY): Admission: EM | Disposition: A | Discharge status: Home / Self Care | Payer: Self-pay | Source: Home / Self Care

## 2012-12-02 HISTORY — PX: CHOLECYSTECTOMY: SHX55

## 2012-12-02 SURGERY — LAPAROSCOPIC CHOLECYSTECTOMY WITH INTRAOPERATIVE CHOLANGIOGRAM
Anesthesia: General | Site: Abdomen | Wound class: Contaminated

## 2012-12-02 MED ORDER — PROPOFOL 10 MG/ML IV EMUL
INTRAVENOUS | Status: DC | PRN
  Administered 2012-12-02: 200 mg via INTRAVENOUS

## 2012-12-02 MED ORDER — GLYCOPYRROLATE 0.2 MG/ML IJ SOLN
INTRAMUSCULAR | Status: DC | PRN
  Administered 2012-12-02: .6 mg via INTRAVENOUS

## 2012-12-02 MED ORDER — MORPHINE SULFATE 2 MG/ML IJ SOLN: 2.0000 mg | INTRAMUSCULAR | Status: DC | PRN

## 2012-12-02 MED ORDER — BUPIVACAINE-EPINEPHRINE 0.25% -1:200000 IJ SOLN
INTRAMUSCULAR | Status: DC | PRN
  Administered 2012-12-02: 15 mL

## 2012-12-02 MED ORDER — HYDROMORPHONE HCL PF 1 MG/ML IJ SOLN: 0.2500 mg | INTRAMUSCULAR | Status: DC | PRN

## 2012-12-02 MED ORDER — PROMETHAZINE HCL 25 MG/ML IJ SOLN
6.2500 mg | INTRAMUSCULAR | Status: DC | PRN
  Administered 2012-12-02: 12.5 mg via INTRAVENOUS
  Filled 2012-12-02 (×2): qty 1

## 2012-12-02 MED ORDER — LACTATED RINGERS IV SOLN
INTRAVENOUS | Status: DC | PRN
  Administered 2012-12-02 (×2): via INTRAVENOUS

## 2012-12-02 MED ORDER — LACTATED RINGERS IV SOLN: INTRAVENOUS | Status: DC

## 2012-12-02 MED ORDER — ONDANSETRON HCL 4 MG/2ML IJ SOLN
INTRAMUSCULAR | Status: DC | PRN
  Administered 2012-12-02: 4 mg via INTRAVENOUS

## 2012-12-02 MED ORDER — NEOSTIGMINE METHYLSULFATE 1 MG/ML IJ SOLN
INTRAMUSCULAR | Status: DC | PRN
  Administered 2012-12-02: 4 mg via INTRAVENOUS

## 2012-12-02 MED ORDER — 0.9 % SODIUM CHLORIDE (POUR BTL) OPTIME
TOPICAL | Status: DC | PRN
  Administered 2012-12-02: 1000 mL

## 2012-12-02 MED ORDER — DEXAMETHASONE SODIUM PHOSPHATE 10 MG/ML IJ SOLN
INTRAMUSCULAR | Status: DC | PRN
  Administered 2012-12-02: 10 mg via INTRAVENOUS

## 2012-12-02 MED ORDER — SODIUM CHLORIDE 0.9 % IV SOLN
3.0000 g | INTRAVENOUS | Status: DC | PRN
  Administered 2012-12-02: 3 g via INTRAVENOUS

## 2012-12-02 MED ORDER — MIDAZOLAM HCL 5 MG/5ML IJ SOLN
INTRAMUSCULAR | Status: DC | PRN
  Administered 2012-12-02: 2 mg via INTRAVENOUS

## 2012-12-02 MED ORDER — DEXTROSE 5 % IV SOLN
2.0000 g | INTRAVENOUS | Status: AC
  Administered 2012-12-02: 2 g via INTRAVENOUS
  Filled 2012-12-02: qty 2

## 2012-12-02 MED ORDER — LIDOCAINE HCL (CARDIAC) 20 MG/ML IV SOLN
INTRAVENOUS | Status: DC | PRN
  Administered 2012-12-02: 100 mg via INTRAVENOUS

## 2012-12-02 MED ORDER — HYDROMORPHONE HCL PF 1 MG/ML IJ SOLN
INTRAMUSCULAR | Status: DC | PRN
  Administered 2012-12-02: 0.5 mg via INTRAVENOUS
  Administered 2012-12-02: 1 mg via INTRAVENOUS
  Administered 2012-12-02: 0.5 mg via INTRAVENOUS
  Administered 2012-12-02 (×4): 1 mg via INTRAVENOUS

## 2012-12-02 MED ORDER — SUCCINYLCHOLINE CHLORIDE 20 MG/ML IJ SOLN
INTRAMUSCULAR | Status: DC | PRN
  Administered 2012-12-02: 100 mg via INTRAVENOUS

## 2012-12-02 MED ORDER — DIATRIZOATE MEGLUMINE 30 % UR SOLN
URETHRAL | Status: DC | PRN
  Administered 2012-12-02: 35 mL

## 2012-12-02 MED ORDER — LACTATED RINGERS IR SOLN
Status: DC | PRN
  Administered 2012-12-02: 4000 mL

## 2012-12-02 MED ORDER — ROCURONIUM BROMIDE 100 MG/10ML IV SOLN
INTRAVENOUS | Status: DC | PRN
  Administered 2012-12-02: 30 mg via INTRAVENOUS
  Administered 2012-12-02: 10 mg via INTRAVENOUS

## 2012-12-02 SURGICAL SUPPLY — 38 items
APPLIER CLIP 5 13 M/L LIGAMAX5 (MISCELLANEOUS) ×2
CABLE HIGH FREQUENCY MONO STRZ (ELECTRODE) ×2 IMPLANT
CANISTER SUCTION 2500CC (MISCELLANEOUS) ×2 IMPLANT
CATH REDDICK CHOLANGI 4FR 50CM (CATHETERS) ×2 IMPLANT
CHLORAPREP W/TINT 26ML (MISCELLANEOUS) ×2 IMPLANT
CLIP APPLIE 5 13 M/L LIGAMAX5 (MISCELLANEOUS) ×1 IMPLANT
CLOTH BEACON ORANGE TIMEOUT ST (SAFETY) ×2 IMPLANT
COVER MAYO STAND STRL (DRAPES) ×2 IMPLANT
COVER SURGICAL LIGHT HANDLE (MISCELLANEOUS) ×2 IMPLANT
DECANTER SPIKE VIAL GLASS SM (MISCELLANEOUS) ×2 IMPLANT
DRAPE C-ARM 42X72 X-RAY (DRAPES) ×2 IMPLANT
DRAPE LAPAROSCOPIC ABDOMINAL (DRAPES) ×2 IMPLANT
DRAPE UTILITY W/TAPE 26X15 (DRAPES) ×2 IMPLANT
DRAPE UTILITY XL STRL (DRAPES) ×2 IMPLANT
DRSG TEGADERM 2-3/8X2-3/4 SM (GAUZE/BANDAGES/DRESSINGS) ×2 IMPLANT
ELECT REM PT RETURN 9FT ADLT (ELECTROSURGICAL) ×2
ELECTRODE REM PT RTRN 9FT ADLT (ELECTROSURGICAL) ×1 IMPLANT
GLOVE BIO SURGEON STRL SZ 6.5 (GLOVE) ×2 IMPLANT
GLOVE BIOGEL PI IND STRL 7.0 (GLOVE) ×1 IMPLANT
GLOVE BIOGEL PI INDICATOR 7.0 (GLOVE) ×1
GOWN STRL NON-REIN LRG LVL3 (GOWN DISPOSABLE) ×2 IMPLANT
GOWN STRL REIN 2XL LVL4 (GOWN DISPOSABLE) ×2 IMPLANT
GOWN STRL REIN XL XLG (GOWN DISPOSABLE) ×4 IMPLANT
HEMOSTAT SURGICEL 4X8 (HEMOSTASIS) ×2 IMPLANT
IV CATH 14GX2 1/4 (CATHETERS) ×2 IMPLANT
KIT BASIN OR (CUSTOM PROCEDURE TRAY) ×2 IMPLANT
NS IRRIG 1000ML POUR BTL (IV SOLUTION) ×2 IMPLANT
POUCH SPECIMEN RETRIEVAL 10MM (ENDOMECHANICALS) ×2 IMPLANT
SCISSORS ENDO CVD 5DCS (MISCELLANEOUS) ×2 IMPLANT
SET IRRIG TUBING LAPAROSCOPIC (IRRIGATION / IRRIGATOR) ×2 IMPLANT
SOLUTION ANTI FOG 6CC (MISCELLANEOUS) ×2 IMPLANT
SUT VIC AB 4-0 PS2 27 (SUTURE) ×2 IMPLANT
SUT VICRYL RAPIDE 4/0 PS 2 (SUTURE) ×2 IMPLANT
TOWEL OR 17X26 10 PK STRL BLUE (TOWEL DISPOSABLE) ×2 IMPLANT
TRAY LAP CHOLE (CUSTOM PROCEDURE TRAY) ×2 IMPLANT
TROCAR BLADELESS OPT 5 75 (ENDOMECHANICALS) ×4 IMPLANT
TROCAR XCEL BLUNT TIP 100MML (ENDOMECHANICALS) ×2 IMPLANT
TUBING INSUFFLATION 10FT LAP (TUBING) ×2 IMPLANT

## 2012-12-02 NOTE — Preoperative (Signed)
Beta Blockers   Reason not to administer Beta Blockers:Not Applicable 

## 2012-12-02 NOTE — Anesthesia Preprocedure Evaluation (Addendum)
Anesthesia Evaluation  Patient identified by MRN, date of birth, ID band Patient awake    Reviewed: Allergy & Precautions, H&P , NPO status , Patient's Chart, lab work & pertinent test results  History of Anesthesia Complications Negative for: history of anesthetic complications (no history of difficult intubation)  Airway Mallampati: II TM Distance: >3 FB Neck ROM: full    Dental No notable dental hx.    Pulmonary neg pulmonary ROS,  breath sounds clear to auscultation  Pulmonary exam normal       Cardiovascular Exercise Tolerance: Good negative cardio ROS  Rhythm:regular Rate:Normal     Neuro/Psych negative neurological ROS  negative psych ROS   GI/Hepatic negative GI ROS, Neg liver ROS,   Endo/Other  negative endocrine ROS  Renal/GU negative Renal ROS  negative genitourinary   Musculoskeletal   Abdominal   Peds  Hematology negative hematology ROS (+)   Anesthesia Other Findings   Reproductive/Obstetrics negative OB ROS                          Anesthesia Physical Anesthesia Plan  ASA: II  Anesthesia Plan: General   Post-op Pain Management:    Induction:   Airway Management Planned:   Additional Equipment:   Intra-op Plan:   Post-operative Plan:   Informed Consent: I have reviewed the patients History and Physical, chart, labs and discussed the procedure including the risks, benefits and alternatives for the proposed anesthesia with the patient or authorized representative who has indicated his/her understanding and acceptance.   Dental Advisory Given  Plan Discussed with: CRNA  Anesthesia Plan Comments:         Anesthesia Quick Evaluation

## 2012-12-02 NOTE — Progress Notes (Signed)
Pt to OR via bed.

## 2012-12-02 NOTE — Progress Notes (Signed)
Valerie Cortez [id number] [birth year]   Subjective: Nausea and pain better today.  Still sore   Objective:  Vital signs:  Filed Vitals:   12/01/12 1410 12/01/12 1800 12/01/12 2206 12/02/12 0600  BP: 113/74 131/82 121/80 139/86  Pulse: 59 58 52 56  Temp: 98.1 F (36.7 C) 98.1 F (36.7 C) 97.7 F (36.5 C) 97.7 F (36.5 C)  TempSrc: Oral Oral Oral Oral  Resp: 14 16 14 14   Height:      Weight:      SpO2: 98% 99% 94% 99%    Last BM Date: 11/28/12  Intake/Output   Yesterday:  01/17 0701 - 01/18 0700 In: 3107.2 [I.V.:3107.2] Out: 2650 [Urine:2650] This shift:     Bowel function:  Flatus: y  BM: n  Physical Exam:  General: Pt awake/alert/oriented x4 in no acute distress.  Tired but not toxic Husband in room Eyes: PERRL, normal EOM.  Sclera clear.  No icterus Neuro: CN II-XII intact w/o focal sensory/motor deficits. Lymph: No head/neck/groin lymphadenopathy Psych:  No delerium/psychosis/paranoia HENT: Normocephalic, Mucus membranes moist.  No thrush Neck: Supple, No tracheal deviation Chest: No chest wall pain w good excursion CV:  Pulses intact.  Regular rhythm MS: Normal AROM mjr joints.  No obvious deformity Abdomen: Soft.  Nondistended.  Mild tender RUQ.  No peritonitis.  No incarcerated hernias. Ext:  SCDs BLE.  No mjr edema.  No cyanosis Skin: No petechiae / purpurae  Problem List:  Principal Problem:  *Gallstone pancreatitis Active Problems:  Constipation   Assessment  Valerie Cortez  22 y.o. female  Day of Surgery     Plan:  MRCP neg for choledocholithiasis -will plan on lap chole today  The anatomy & physiology of hepatobiliary & pancreatic function was discussed.  The pathophysiology of gallbladder dysfunction was discussed.  Natural history risks without surgery was discussed.   I feel the risks of no intervention will lead to serious problems that outweigh the operative risks; therefore, I recommended cholecystectomy to remove the pathology.  I  explained laparoscopic techniques with possible need for an open approach.  Probable cholangiogram to evaluate the bilary tract was explained as well.    Risks such as bleeding, infection, abscess, leak, injury to other organs, need for further treatment, heart attack, death, and other risks were discussed.  I noted a good likelihood this will help address the problem.  Possibility that this will not correct all abdominal symptoms was explained.  Goals of post-operative recovery were discussed as well.  We will work to minimize complications.  An educational handout further explaining the pathology and treatment options was given as well.  Questions were answered.  The patient expresses understanding & wishes to proceed with surgery.  -cont IV ABx for now -VTE prophylaxis- SCDs, etc -mobilize as tolerated to help recovery  Vanita Panda, MD  Colorectal and General Surgery [facility]   12/02/2012  CARE TEAM:  PCP: No primary provider on file.  Outpatient Care Team: Patient has no care team.  Inpatient Treatment Team: Treatment Team: Attending Provider: Md Montez Morita, MD; Technician: Justice Britain, NT; Registered Nurse: Nira Conn, RN; Attending Physician: Florencia Reasons, MD; Attending Physician: Barrie Folk, MD; Registered Nurse: Adele Barthel, RN; Registered Nurse: Yvone Neu, RN; Technician: Elicia Lamp, NT   Results:   Labs: Results for orders placed during the hospital encounter of 11/29/12 (from the past 48 hour(s))  URINALYSIS, ROUTINE W REFLEX MICROSCOPIC     Status: Abnormal  Collection Time   11/30/12  1:54 PM      Component Value Range Comment   Color, Urine YELLOW  YELLOW    APPearance CLEAR  CLEAR    Specific Gravity, Urine 1.014  1.005 - 1.030    pH 6.0  5.0 - 8.0    Glucose, UA NEGATIVE  NEGATIVE mg/dL    Hgb urine dipstick NEGATIVE  NEGATIVE    Bilirubin Urine MODERATE (*) NEGATIVE    Ketones, ur TRACE (*) NEGATIVE mg/dL      Protein, ur NEGATIVE  NEGATIVE mg/dL    Urobilinogen, UA 0.2  0.0 - 1.0 mg/dL    Nitrite NEGATIVE  NEGATIVE    Leukocytes, UA NEGATIVE  NEGATIVE MICROSCOPIC NOT DONE ON URINES WITH NEGATIVE PROTEIN, BLOOD, LEUKOCYTES, NITRITE, OR GLUCOSE <1000 mg/dL.  CBC     Status: Abnormal   Collection Time   12/01/12  4:00 AM      Component Value Range Comment   WBC 5.4  4.0 - 10.5 K/uL    RBC 4.48  3.87 - 5.11 MIL/uL    Hemoglobin 11.3 (*) 12.0 - 15.0 g/dL    HCT 16.1  09.6 - 04.5 %    MCV 80.4  78.0 - 100.0 fL    MCH 25.2 (*) 26.0 - 34.0 pg    MCHC 31.4  30.0 - 36.0 g/dL    RDW 40.9  81.1 - 91.4 %    Platelets 215  150 - 400 K/uL   COMPREHENSIVE METABOLIC PANEL     Status: Abnormal   Collection Time   12/01/12  4:00 AM      Component Value Range Comment   Sodium 140  135 - 145 mEq/L    Potassium 3.5  3.5 - 5.1 mEq/L    Chloride 104  96 - 112 mEq/L    CO2 28  19 - 32 mEq/L    Glucose, Bld 78  70 - 99 mg/dL    BUN 4 (*) 6 - 23 mg/dL    Creatinine, Ser 7.82  0.50 - 1.10 mg/dL    Calcium 9.1  8.4 - 95.6 mg/dL    Total Protein 6.2  6.0 - 8.3 g/dL    Albumin 3.1 (*) 3.5 - 5.2 g/dL    AST 213 (*) 0 - 37 U/L    ALT 306 (*) 0 - 35 U/L    Alkaline Phosphatase 293 (*) 39 - 117 U/L    Total Bilirubin 1.2  0.3 - 1.2 mg/dL    GFR calc non Af Amer >90  >90 mL/min    GFR calc Af Amer >90  >90 mL/min   LIPASE, BLOOD     Status: Normal   Collection Time   12/01/12  4:00 AM      Component Value Range Comment   Lipase 46  11 - 59 U/L     Imaging / Studies: Mr Mrcp  12/01/2012  *RADIOLOGY REPORT*  Clinical Data:  Abdominal pain.  The patient is 8 weeks postpartum. Elevated liver function tests with nausea and vomiting.  MRI ABDOMEN WITHOUT AND WITH CONTRAST (MRCP)  Technique:  Multiplanar multisequence MR imaging of the abdomen was performed without and with contrast, including heavily T2-weighted images of the biliary and pancreatic ducts.  Three-dimensional MR images were rendered by post processing  of the original MR data.  Comparison:   None  Findings:  No focal hepatic lesion.  No significant intrahepatic biliary ductal dilatation.  The common hepatic duct and common bile  duct are normal caliber.  The common bile duct measures 5 mm.  There are no filling defects within the common bile duct or external compression.  No evidence of ascites surrounding the liver.  The portal veins are patent.  There are multiple small gallstones layering dependently within the gallbladder.  These measure 3 mm each and number approximately 10. Gallbladder wall is mildly thickened at 3 mm.  No pericholecystic fluid.  The pancreas is normal with normal ductal anatomy.  No evidence of inflammation.  The spleen, adrenal glands, and kidneys are normal.  The stomach and limited view of the small bowel and colon are unremarkable.  No retroperitoneal lymphadenopathy.  IMPRESSION:  1.  No evidence of biliary obstruction.  No choledocholithiasis.  2.  Cholelithiasis and mild gallbladder wall thickening.  No pericholecystic fluid.   Original Report Authenticated By: Genevive Bi, M.D.    Mr 3d Recon At Scanner  12/01/2012  *RADIOLOGY REPORT*  Clinical Data:  Abdominal pain.  The patient is 8 weeks postpartum. Elevated liver function tests with nausea and vomiting.  MRI ABDOMEN WITHOUT AND WITH CONTRAST (MRCP)  Technique:  Multiplanar multisequence MR imaging of the abdomen was performed without and with contrast, including heavily T2-weighted images of the biliary and pancreatic ducts.  Three-dimensional MR images were rendered by post processing of the original MR data.  Comparison:   None  Findings:  No focal hepatic lesion.  No significant intrahepatic biliary ductal dilatation.  The common hepatic duct and common bile duct are normal caliber.  The common bile duct measures 5 mm.  There are no filling defects within the common bile duct or external compression.  No evidence of ascites surrounding the liver.  The portal veins are  patent.  There are multiple small gallstones layering dependently within the gallbladder.  These measure 3 mm each and number approximately 10. Gallbladder wall is mildly thickened at 3 mm.  No pericholecystic fluid.  The pancreas is normal with normal ductal anatomy.  No evidence of inflammation.  The spleen, adrenal glands, and kidneys are normal.  The stomach and limited view of the small bowel and colon are unremarkable.  No retroperitoneal lymphadenopathy.  IMPRESSION:  1.  No evidence of biliary obstruction.  No choledocholithiasis.  2.  Cholelithiasis and mild gallbladder wall thickening.  No pericholecystic fluid.   Original Report Authenticated By: Genevive Bi, M.D.     Medications / Allergies: per chart  Antibiotics: Anti-infectives     Start     Dose/Rate Route Frequency Ordered Stop   12/02/12 0800   cefOXitin (MEFOXIN) 2 g in dextrose 5 % 50 mL IVPB        2 g 100 mL/hr over 30 Minutes Intravenous On call to O.R. 12/02/12 0727 12/03/12 0559   12/01/12 0730   Ampicillin-Sulbactam (UNASYN) 3 g in sodium chloride 0.9 % 100 mL IVPB        3 g 100 mL/hr over 60 Minutes Intravenous On call to O.R. 12/01/12 0700 12/02/12 0559   11/29/12 2300   azithromycin (ZITHROMAX) tablet 1,000 mg  Status:  Discontinued        1,000 mg Oral  Once 11/29/12 2252 11/29/12 2254

## 2012-12-02 NOTE — Progress Notes (Signed)
Patient complains of difficulty starting urine. Attempted void at 2240, but no results. Earlier in shift, emptied of very dark Kden Wagster urine, with odor. Will monitor. Sorayah Schrodt [place], [place] 12/02/2012 10:47 PM

## 2012-12-02 NOTE — Progress Notes (Signed)
Patient states her bladder feels full but she has no urge to void. Bladder scan shows 748 mL. Per MD order(greater than 500 mL), Foley Cath placed to gravity. Patient states relief. Clear urine, Bright yellow ("neon") in color. Patient states that voiding and numbness have been an issue since delivering/epidural 8 weeks ago. 12/02/2012 11:30 PM Valerie Cortez Parents, RN

## 2012-12-02 NOTE — Op Note (Signed)
11/29/2012 - 12/02/2012  1:34 PM  PATIENT:  Valerie Cortez  22 y.o. female  Patient has no care team.  PRE-OPERATIVE DIAGNOSIS:  cholelithiasis  POST-OPERATIVE DIAGNOSIS:  cholelithiasis  PROCEDURE:  Procedure(s): LAPAROSCOPIC CHOLECYSTECTOMY WITH INTRAOPERATIVE CHOLANGIOGRAM  SURGEON:  Surgeon(s): Romie Levee, MD Mariella Saa, MD  ASSISTANT: [place]   ANESTHESIA:   general  EBL: min  Total I/O In: 1500 [I.V.:1500] Out: 800 [Urine:800]  DRAINS: none   SPECIMEN:  Source of Specimen:  Gallbladder  DISPOSITION OF SPECIMEN:  PATHOLOGY  COUNTS:  YES  PLAN OF CARE: inpatient, return to room  PATIENT DISPOSITION:  PACU - hemodynamically stable.  INDICATION: Gallstone pancreatitis  The anatomy & physiology of hepatobiliary & pancreatic function was discussed.  The pathophysiology of gallbladder dysfunction was discussed.  Natural history risks without surgery was discussed.   I feel the risks of no intervention will lead to serious problems that outweigh the operative risks; therefore, I recommended cholecystectomy to remove the pathology.  I explained laparoscopic techniques with possible need for an open approach.  Probable cholangiogram to evaluate the bilary tract was explained as well.    Risks such as bleeding, infection, abscess, leak, injury to other organs, need for further treatment, heart attack, death, and other risks were discussed.  I noted a good likelihood this will help address the problem.  Possibility that this will not correct all abdominal symptoms was explained.  Goals of post-operative recovery were discussed as well.    OR FINDINGS: edematous gallbladder, inconclusive cholangiogram  DESCRIPTION:   The patient was identified & brought into the operating room. The patient was positioned supine with arms out. SCDs were active during the entire case. The patient underwent general anesthesia without any difficulty.  The abdomen was prepped and draped in  a sterile fashion. A Surgical Timeout was performed and confirmed our plan.  We positioned the patient in reverse [place] & right side up.  I placed a Hassan laparoscopic port through the umbilicus using open entry technique.  Entry was clean. There were no adhesions to the anterior abdominal wall supraumbilically.  We induced carbon dioxide insufflation. Camera inspection revealed some minor omental bleeding that had stopped.   I proceeded to continue with laparoscopic technique. I placed a #5 port in mid subcostal region, another 5mm port in the right flank near the anterior axillary line, and a 5mm port in the left subxiphoid region obliquely within the falciform ligament.  I turned attention to the right upper quadrant.    The gallbladder fundus was elevated cephalad. I used cautery and blunt dissection to free the peritoneal coverings between the gallbladder and the liver on the posteriolateral and anteriomedial walls.   I used careful blunt and cautery dissection with a maryland dissector to help get a good critical view of the cystic artery and cystic duct. I did further dissection to free a few centimeters of the  gallbladder off the liver bed to get a good critical view of the infundibulum and cystic duct. I mobilized the cystic artery.  I skeletonized the cystic duct.  After getting a good 360 view, I decided to perform a cholangiogram.  I placed a clip on the infundibulum.   I did a partial cystic duct-otomy and ensured patency. I placed a 5 French balloon cholangiocatheter through a puncture site at the right subcostal ridge of the abdominal wall and directed it into the cystic duct.  We ran a cholangiogram with dilute radio-opaque contrast and continuous fluoroscopy.  Contrast flowed from a side branch consistent with cystic duct cannulization. Contrast flowed up the common hepatic duct into the right and left intrahepatic chains out to secondary radicals. Contrast flowed down the common  bile duct easily but did not traverse the ampulla into the duodenum despite 2 attempts.  This was consistent with an inconclusive cholangiogram.  I saw no major filling defects in the ampulla.  I removed the cholangiocatheter.  I placed clips on the cystic duct x3.  I completed cystic duct transection.   I placed clips on the cystic artery x3 with 2 proximally.  I ligated the cystic artery using scissors. I freed the gallbladder from its remaining attachments to the liver. I ensured hemostasis on the gallbladder fossa of the liver and elsewhere. I inspected the rest of the abdomen & detected no injury nor bleeding elsewhere.  I irrigated the RUQ with normal saline.  I removed the gallbladder through the umbilical port site.  I closed the umbilical fascia using 0 Vicryl stitches.   I closed the skin using 4-0 vicryl stitch.  Sterile dressings were applied. The patient was extubated & arrived in the PACU in stable condition.  I had discussed postoperative care with the patient in the holding area. We will obtain LFT's in the AM to evaluate for any residual CBD blockage.  I will discuss  operative findings and postoperative goals / instructions with the patient's family.  Instructions are written in the chart as well.

## 2012-12-02 NOTE — Anesthesia Postprocedure Evaluation (Signed)
  Anesthesia Post-op Note  Patient: Valerie Cortez  Procedure(s) Performed: Procedure(s) (LRB): LAPAROSCOPIC CHOLECYSTECTOMY WITH INTRAOPERATIVE CHOLANGIOGRAM (N/A)  Patient Location: PACU  Anesthesia Type: General  Level of Consciousness: awake and alert   Airway and Oxygen Therapy: Patient Spontanous Breathing  Post-op Pain: mild  Post-op Assessment: Post-op Vital signs reviewed, Patient's Cardiovascular Status Stable, Respiratory Function Stable, Patent Airway and No signs of Nausea or vomiting  Last Vitals:  Filed Vitals:   12/02/12 1515  BP: 125/80  Pulse: 65  Temp: 36.4 C  Resp: 16    Post-op Vital Signs: stable   Complications: No apparent anesthesia complications

## 2012-12-02 NOTE — Transfer of Care (Signed)
Immediate Anesthesia Transfer of Care Note  Patient: Valerie Cortez  Procedure(s) Performed: Procedure(s) (LRB) with comments: LAPAROSCOPIC CHOLECYSTECTOMY WITH INTRAOPERATIVE CHOLANGIOGRAM (N/A)  Patient Location: PACU  Anesthesia Type:General  Level of Consciousness: awake, alert  and confused  Airway & Oxygen Therapy: Patient Spontanous Breathing and Patient connected to face mask oxygen  Post-op Assessment: Report given to PACU RN and Post -op Vital signs reviewed and stable  Post vital signs: Reviewed and stable  Complications: No apparent anesthesia complications

## 2012-12-03 LAB — CBC
MCH: 25.3 pg — ABNORMAL LOW (ref 26.0–34.0)
MCHC: 31.9 g/dL (ref 30.0–36.0)
Platelets: 250 10*3/uL (ref 150–400)
RBC: 4.83 MIL/uL (ref 3.87–5.11)

## 2012-12-03 LAB — LIPASE, BLOOD: Lipase: 42 U/L (ref 11–59)

## 2012-12-03 LAB — COMPREHENSIVE METABOLIC PANEL
AST: 225 U/L — ABNORMAL HIGH (ref 0–37)
CO2: 26 mEq/L (ref 19–32)
Chloride: 95 mEq/L — ABNORMAL LOW (ref 96–112)
Creatinine, Ser: 0.63 mg/dL (ref 0.50–1.10)
GFR calc non Af Amer: >90 mL/min (ref >90)
Total Bilirubin: 4.5 mg/dL — ABNORMAL HIGH (ref 0.3–1.2)

## 2012-12-03 MED ORDER — TRAMADOL HCL 50 MG PO TABS
50.0000 mg | ORAL_TABLET | Freq: Four times a day (QID) | ORAL | Status: DC | PRN
  Administered 2012-12-03 – 2012-12-06 (×5): 100 mg via ORAL
  Filled 2012-12-03 (×6): qty 2

## 2012-12-03 MED ORDER — KETOROLAC TROMETHAMINE 30 MG/ML IJ SOLN
30.0000 mg | Freq: Four times a day (QID) | INTRAMUSCULAR | Status: AC
  Administered 2012-12-03 – 2012-12-04 (×4): 30 mg via INTRAVENOUS
  Filled 2012-12-03 (×6): qty 1

## 2012-12-03 NOTE — Progress Notes (Signed)
Pt tearful and holding her abdomen. States pain increased after water intake. Encouraged to remain NPO. Had obtained some relief after walking over 150 feet in hallway. Ultram effective but has to be taken po. Will make MD aware.

## 2012-12-03 NOTE — Progress Notes (Signed)
Valerie Cortez [id number] 1991-07-20   Subjective: Sore today, min nausea.  Tolerating clears   Objective:  Vital signs:  Filed Vitals:   12/02/12 1514 12/02/12 1515 12/02/12 2155 12/03/12 0450  BP: 125/80 125/80 109/70 116/76  Pulse: 66 65 66 66  Temp: 97.5 F (36.4 C) 97.5 F (36.4 C) 98.2 F (36.8 C) 98.2 F (36.8 C)  TempSrc:   Oral Oral  Resp: 17 16 16 16   Height:      Weight:      SpO2: 100% 100% 96% 97%    Last BM Date: 11/28/12  Intake/Output   Yesterday:  01/18 0701 - 01/19 0700 In: 4355 [P.O.:180; I.V.:4175] Out: 4175 [Urine:4150; Blood:25] This shift:  Total I/O In: -  Out: 250 [Urine:250]   Physical Exam:  General: Pt awake/alert/oriented x4 in no acute distress.  Tired but not toxic MS: Normal AROM mjr joints.  No obvious deformity Abdomen: Soft.  Nondistended.  Mild tender RUQ.  No peritonitis.  No incarcerated hernias. Ext:  SCDs BLE.  No mjr edema.  No cyanosis Skin: No petechiae / purpurae  Problem List:  Principal Problem:  *Gallstone pancreatitis Active Problems:  Constipation   Assessment  Valerie Cortez  22 y.o. female  1 Day Post-Op     Plan: Cont IV Abx Cont clears Will recheck LFTs in AM.  If still elevated, may need to call GI to eval again for sphincterotomy Cont foley for low UOP, urinary retention  Vanita Panda, MD  Colorectal and General Surgery [facility]   12/03/2012  CARE TEAM:  PCP: No primary provider on file.  Outpatient Care Team: Patient has no care team.  Inpatient Treatment Team: Treatment Team: Attending Provider: Md Montez Morita, MD; Technician: Justice Britain, NT; Registered Nurse: Nira Conn, RN; Attending Physician: Florencia Reasons, MD; Attending Physician: Barrie Folk, MD; Registered Nurse: Adele Barthel, RN; Registered Nurse: Amber Lynder Parents, RN; Technician: Karl Pock, NT; Registered Nurse: Yvone Neu, RN   Results:   Labs: Results for orders placed  during the hospital encounter of 11/29/12 (from the past 48 hour(s))  COMPREHENSIVE METABOLIC PANEL     Status: Abnormal   Collection Time   12/03/12  4:45 AM      Component Value Range Comment   Sodium 137  135 - 145 mEq/L    Potassium 3.4 (*) 3.5 - 5.1 mEq/L    Chloride 95 (*) 96 - 112 mEq/L    CO2 26  19 - 32 mEq/L    Glucose, Bld 62 (*) 70 - 99 mg/dL    BUN 4 (*) 6 - 23 mg/dL    Creatinine, Ser 4.09  0.50 - 1.10 mg/dL    Calcium 9.8  8.4 - 81.1 mg/dL    Total Protein 6.5  6.0 - 8.3 g/dL    Albumin 3.4 (*) 3.5 - 5.2 g/dL    AST 914 (*) 0 - 37 U/L    ALT 313 (*) 0 - 35 U/L    Alkaline Phosphatase 406 (*) 39 - 117 U/L    Total Bilirubin 4.5 (*) 0.3 - 1.2 mg/dL    GFR calc non Af Amer >90  >90 mL/min    GFR calc Af Amer >90  >90 mL/min   CBC     Status: Abnormal   Collection Time   12/03/12  4:45 AM      Component Value Range Comment   WBC 7.0  4.0 - 10.5 K/uL  RBC 4.83  3.87 - 5.11 MIL/uL    Hemoglobin 12.2  12.0 - 15.0 g/dL    HCT 16.1  09.6 - 04.5 %    MCV 79.3  78.0 - 100.0 fL    MCH 25.3 (*) 26.0 - 34.0 pg    MCHC 31.9  30.0 - 36.0 g/dL    RDW 40.9 (*) 81.1 - 15.5 %    Platelets 250  150 - 400 K/uL     Imaging / Studies: Dg Cholangiogram Operative  12/02/2012  *RADIOLOGY REPORT*  Clinical Data: Gallstones  INTRAOPERATIVE CHOLANGIOGRAM  Comparison:  Abdominal MRI - 11/30/2012; abdominal ultrasound - 11/29/2012  Findings:  Intraoperative angiographic images of the right upper abdominal quadrant during laparoscopic cholecystectomy are provided for review.  Surgical clips overlie the expected location of the gallbladder fossa.  Contrast injection demonstrates selective cannulation of the central aspect of the cystic duct.  There is passage of contrast through the central aspect of the cystic duct with filling of a normal caliber common bile duct.  There is no definitive opacification of the duodenum.  There is no definitive opacification of the pancreatic duct.  There is  minimal reflux of injected contrast into the common hepatic duct and central aspect of the nondilated intrahepatic biliary system. An air bubble was noted to rises to the nondependent portion of the anterior aspect of the right biliary tree.  Otherwise, there are no discrete filling defects within the opacified portions of the biliary system to suggest the presence of choledocholithiasis.  There is a minimal amount of extravasation within the gallbladder fossa.  IMPRESSION:  Intraoperative cholangiogram as above.  No discrete filling defects to suggest the presence of choledocholithiasis however contract was not definitively seen passing through the distal CBD and into the duodenum.   Original Report Authenticated By: Tacey Ruiz, MD     Medications / Allergies: per chart  Antibiotics: Anti-infectives     Start     Dose/Rate Route Frequency Ordered Stop   12/02/12 0800   cefOXitin (MEFOXIN) 2 g in dextrose 5 % 50 mL IVPB        2 g 100 mL/hr over 30 Minutes Intravenous On call to O.R. 12/02/12 0727 12/02/12 1215   12/01/12 0730   Ampicillin-Sulbactam (UNASYN) 3 g in sodium chloride 0.9 % 100 mL IVPB        3 g 100 mL/hr over 60 Minutes Intravenous On call to O.R. 12/01/12 0700 12/02/12 0559   11/29/12 2300   azithromycin (ZITHROMAX) tablet 1,000 mg  Status:  Discontinued        1,000 mg Oral  Once 11/29/12 2252 11/29/12 2254

## 2012-12-03 NOTE — Progress Notes (Signed)
Patient had BM, stated it was clay/very light colored. She says this has been the case since delivering her child/having the epidural. Will continue to monitor. Landra Howze [place], [place] 12/03/2012 9:39 PM

## 2012-12-03 NOTE — Plan of Care (Signed)
Problem: Phase I Progression Outcomes Goal: Voiding-avoid urinary catheter unless indicated Outcome: Not Met (add Reason) Foley cath placed, per MD order

## 2012-12-03 NOTE — Progress Notes (Addendum)
Pt C/O hives and itching she believes this is related to Oxycodone. Will make MD/Pharm aware. Benadryl given po. Made Pharmacy aware.

## 2012-12-04 ENCOUNTER — Encounter (HOSPITAL_COMMUNITY): Payer: Self-pay | Admitting: General Surgery

## 2012-12-04 ENCOUNTER — Encounter (HOSPITAL_COMMUNITY): Admission: EM | Disposition: A | Discharge status: Home / Self Care | Payer: Self-pay | Source: Home / Self Care

## 2012-12-04 ENCOUNTER — Inpatient Hospital Stay (HOSPITAL_COMMUNITY): Payer: BC Managed Care – PPO

## 2012-12-04 HISTORY — PX: ERCP: SHX5425

## 2012-12-04 LAB — COMPREHENSIVE METABOLIC PANEL
AST: 153 U/L — ABNORMAL HIGH (ref 0–37)
Albumin: 3.1 g/dL — ABNORMAL LOW (ref 3.5–5.2)
Calcium: 9.2 mg/dL (ref 8.4–10.5)
Creatinine, Ser: 0.57 mg/dL (ref 0.50–1.10)
Total Protein: 6 g/dL (ref 6.0–8.3)

## 2012-12-04 LAB — MAGNESIUM: Magnesium: 2.2 mg/dL (ref 1.5–2.5)

## 2012-12-04 SURGERY — ERCP, WITH INTERVENTION IF INDICATED
Anesthesia: Moderate Sedation

## 2012-12-04 MED ORDER — FENTANYL CITRATE 0.05 MG/ML IJ SOLN
INTRAMUSCULAR | Status: AC
  Filled 2012-12-04: qty 4

## 2012-12-04 MED ORDER — MEPERIDINE HCL 25 MG/ML IJ SOLN
INTRAMUSCULAR | Status: DC | PRN
  Administered 2012-12-04 (×4): 25 mg via INTRAVENOUS

## 2012-12-04 MED ORDER — MIDAZOLAM HCL 10 MG/2ML IJ SOLN
INTRAMUSCULAR | Status: DC | PRN
  Administered 2012-12-04 (×6): 2 mg via INTRAVENOUS

## 2012-12-04 MED ORDER — CIPROFLOXACIN IN D5W 400 MG/200ML IV SOLN
400.0000 mg | Freq: Two times a day (BID) | INTRAVENOUS | Status: DC
  Administered 2012-12-04: 400 mg via INTRAVENOUS

## 2012-12-04 MED ORDER — GLUCAGON HCL (RDNA) 1 MG IJ SOLR
INTRAMUSCULAR | Status: AC
  Filled 2012-12-04: qty 2

## 2012-12-04 MED ORDER — SODIUM CHLORIDE 0.9 % IV SOLN: INTRAVENOUS | Status: DC

## 2012-12-04 MED ORDER — CIPROFLOXACIN IN D5W 400 MG/200ML IV SOLN
INTRAVENOUS | Status: AC
  Filled 2012-12-04: qty 200

## 2012-12-04 MED ORDER — MIDAZOLAM HCL 10 MG/2ML IJ SOLN
INTRAMUSCULAR | Status: AC
  Filled 2012-12-04: qty 4

## 2012-12-04 MED ORDER — DIPHENHYDRAMINE HCL 50 MG/ML IJ SOLN
INTRAMUSCULAR | Status: AC
  Filled 2012-12-04: qty 1

## 2012-12-04 MED ORDER — BUTAMBEN-TETRACAINE-BENZOCAINE 2-2-14 % EX AERO
INHALATION_SPRAY | CUTANEOUS | Status: DC | PRN
  Administered 2012-12-04: 2 via TOPICAL

## 2012-12-04 MED ORDER — POTASSIUM CHLORIDE IN NACL 40-0.9 MEQ/L-% IV SOLN
INTRAVENOUS | Status: DC
  Administered 2012-12-04 (×2): via INTRAVENOUS
  Filled 2012-12-04 (×5): qty 1000

## 2012-12-04 MED ORDER — GLUCAGON HCL (RDNA) 1 MG IJ SOLR
INTRAMUSCULAR | Status: DC | PRN
  Administered 2012-12-04: .5 mL via INTRAVENOUS

## 2012-12-04 MED ORDER — DIPHENHYDRAMINE HCL 50 MG/ML IJ SOLN
INTRAMUSCULAR | Status: DC | PRN
  Administered 2012-12-04 (×2): 25 mg via INTRAVENOUS

## 2012-12-04 MED ORDER — MEPERIDINE HCL 100 MG/ML IJ SOLN
INTRAMUSCULAR | Status: AC
  Filled 2012-12-04: qty 2

## 2012-12-04 MED ORDER — SODIUM CHLORIDE 0.9 % IV SOLN
INTRAVENOUS | Status: DC | PRN
  Administered 2012-12-04 – 2012-12-05 (×2)

## 2012-12-04 NOTE — Progress Notes (Signed)
ERCP today unsuccessful due to discovery the patient is allergic to at least 2 narcotics and we were unable to use fentanyl. Even with 10 mg of Versed 50 mg of Benadryl in and schedule 100 mg of Demerol she was unable to be adequately sedated. We decided to reattempt the procedure under propofol sedation tomorrow.

## 2012-12-04 NOTE — Care Management Note (Signed)
    Page 1 of 1   12/04/2012     6:22:35 PM   CARE MANAGEMENT NOTE 12/04/2012  Patient:  Valerie Cortez, Valerie Cortez   Account Number:  [id number]  Date Initiated:  12/04/2012  Documentation initiated by:  Colleen Can  Subjective/Objective Assessment:   dx gallstones, pancreatitis     Action/Plan:   Home upon discharge   Anticipated DC Date:  12/07/2012   Anticipated DC Plan:  HOME/SELF CARE      DC Planning Services  CM consult      Choice offered to / List presented to:             Status of service:  In process, will continue to follow Medicare Important Message given?   (If response is "NO", the following Medicare IM given date fields will be blank) Date Medicare IM given:   Date Additional Medicare IM given:    Discharge Disposition:    Per UR Regulation:  Reviewed for med. necessity/level of care/duration of stay  If discussed at Long Length of Stay Meetings, dates discussed:    Comments:  12/04/2012 Colleen Can BSN RN CCM [phone] ERCP unsuccessful today. CM will follow for dischaqrge needs

## 2012-12-04 NOTE — ED Provider Notes (Signed)
Medical screening examination/treatment/procedure(s) were performed by non-physician practitioner and as supervising physician I was immediately available for consultation/collaboration.   Ramello Cordial H Noell Lorensen, MD 12/04/12 1515 

## 2012-12-04 NOTE — Op Note (Addendum)
[facility] [address] [place], [address]   ERCP PROCEDURE REPORT  PATIENT: Valerie Cortez, Valerie Cortez.  MR# :[id number] BIRTHDATE: 08-Sep-1991  GENDER: Female ENDOSCOPIST: Dorena Cookey, MD REFERRED BY: PROCEDURE DATE:  12/04/2012 PROCEDURE: ASA CLASS: INDICATIONS: suspected common bile duct obstruction after cholecystectomy with poor drainage of bile on intraoperative cholangiogram MEDICATIONS:  Demerol100 mg, Versed 10 mg, Benadryl 50 mg TOPICAL ANESTHETIC:  DESCRIPTION OF PROCEDURE:   After the risks benefits and alternatives of the procedure were thoroughly explained, informed consent was obtained.  The Pentax side-viewing       endoscope was introduced through the mouth and advanced to the papilla of Vater. The papilla had an extremely right lateral position requiring that I turned the scope and handles far to the right making cannulation difficult. I was able to achieve shallow cannulation of the ampulla and 1 wire entry in one injection of the pancreatic duct which appeared normal. The positioning became more difficult and the patient was not sedating well despite the medicines given above . given both the apparent technical difficulties and difficulties with sedation we decided to terminate the procedure and repeated under propofol sedation to      COMPLICATIONS: none immediate  ENDOSCOPIC IMPRESSION:unsuccessful ERCP attempt to 2 limitations of sedation 1 pancreatic duct a wire entry and one pancreatic duct injection common bile duct not  RECOMMENDATIONS:repeat study under monitored anesthesia care tomorrow.     _______________________________ Rosalie DoctorDorena Cookey, MD 12/04/2012 4:16 PM   CC:

## 2012-12-04 NOTE — Progress Notes (Signed)
Patient still having her presenting pain, liver function tests still elevated, along with poor drainage at the time of her intraoperative cholangiogram, I agree with ERCP which will be done later today.

## 2012-12-04 NOTE — Progress Notes (Signed)
2 Days Post-Op  Subjective: She still hurts mostly mid upper abdomen, having nausea and taking Phenergan for that.  Foley still in and bladder feels full. No real improvement.  Objective: Vital signs in last 24 hours: Temp:  [97.9 F (36.6 C)-98.4 F (36.9 C)] 98.1 F (36.7 C) (01/20 0556) Pulse Rate:  [68-70] 70  (01/20 0556) Resp:  [16-17] 16  (01/20 0556) BP: (99-121)/(60-78) 121/78 mmHg (01/20 0556) SpO2:  [96 %-100 %] 96 % (01/20 0556) Last BM Date: 11/28/12 240 Po recorded, NPO, afebrile, VSS, Lipase 157, Tbil still up to 4.4, other LFT's also up. IOC showed no stones,but she was not seen passing contrast thru to duodenum.  Intake/Output from previous day: 01/19 0701 - 01/20 0700 In: 3240 [P.O.:240; I.V.:3000] Out: 2652 [Urine:2650; Stool:2] Intake/Output this shift:    General appearance: alert, cooperative and no distress GI: tender, incisions look fine, no distension, complains of pain in the mid abdomen.  + Bs,   Lab Results:   [facility] 12/03/12 0445  WBC 7.0  HGB 12.2  HCT 38.3  PLT 250    BMET  Basename 12/04/12 0423 12/03/12 0445  NA 143 137  K 3.3* 3.4*  CL 102 95*  CO2 30 26  GLUCOSE 102* 62*  BUN <3* 4*  CREATININE 0.57 0.63  CALCIUM 9.2 9.8   PT/INR No results found for this basename: LABPROT:2,INR:2 in the last 72 hours   Lab 12/04/12 0423 12/03/12 0445 12/01/12 0400 11/30/12 0400 11/29/12 2100  AST 153* 225* 129* 408* 569*  ALT 270* 313* 306* 456* 552*  ALKPHOS 405* 406* 293* 299* 356*  BILITOT 4.4* 4.5* 1.2 2.1* 2.5*  PROT 6.0 6.5 6.2 6.1 7.9  ALBUMIN 3.1* 3.4* 3.1* 3.1* 4.2     Lipase     Component Value Date/Time   LIPASE 157* 12/04/2012 0423     Studies/Results: Dg Cholangiogram Operative  12/02/2012  *RADIOLOGY REPORT*  Clinical Data: Gallstones  INTRAOPERATIVE CHOLANGIOGRAM  Comparison:  Abdominal MRI - 11/30/2012; abdominal ultrasound - 11/29/2012  Findings:  Intraoperative angiographic images of the right upper abdominal  quadrant during laparoscopic cholecystectomy are provided for review.  Surgical clips overlie the expected location of the gallbladder fossa.  Contrast injection demonstrates selective cannulation of the central aspect of the cystic duct.  There is passage of contrast through the central aspect of the cystic duct with filling of a normal caliber common bile duct.  There is no definitive opacification of the duodenum.  There is no definitive opacification of the pancreatic duct.  There is minimal reflux of injected contrast into the common hepatic duct and central aspect of the nondilated intrahepatic biliary system. An air bubble was noted to rises to the nondependent portion of the anterior aspect of the right biliary tree.  Otherwise, there are no discrete filling defects within the opacified portions of the biliary system to suggest the presence of choledocholithiasis.  There is a minimal amount of extravasation within the gallbladder fossa.  IMPRESSION:  Intraoperative cholangiogram as above.  No discrete filling defects to suggest the presence of choledocholithiasis however contract was not definitively seen passing through the distal CBD and into the duodenum.   Original Report Authenticated By: Tacey Ruiz, MD     Medications:    . antiseptic oral rinse  15 mL Mouth Rinse q12n4p  . chlorhexidine  15 mL Mouth Rinse BID  . ketorolac  30 mg Intravenous Q6H  . lip balm  1 application Topical BID  Assessment/Plan Gallstone Pancreatitis, s/p LAPAROSCOPIC CHOLECYSTECTOMY WITH INTRAOPERATIVE CHOLANGIOGRAM  Romie Levee, MD, 12/02/2012 C section 10/04/12   Plan:  D/C foley, Dr. Maisie Fus spoke with GI and they should see today, for possible Spincterotomy.  I will get her foley out and see how she does, she reports walking allot yesterday. Labs in AM.  Replace K+ thru the IV.    LOS: 5 days    Densil Ottey 12/04/2012

## 2012-12-04 NOTE — Progress Notes (Signed)
Agree with above, lipase mild elevation bili still up, no flow to duodenum on cholangiogram, await gi eval

## 2012-12-05 ENCOUNTER — Encounter (HOSPITAL_COMMUNITY): Payer: Self-pay | Admitting: Anesthesiology

## 2012-12-05 ENCOUNTER — Encounter (HOSPITAL_COMMUNITY): Payer: Self-pay | Admitting: Gastroenterology

## 2012-12-05 ENCOUNTER — Inpatient Hospital Stay (HOSPITAL_COMMUNITY): Payer: BC Managed Care – PPO

## 2012-12-05 ENCOUNTER — Inpatient Hospital Stay (HOSPITAL_COMMUNITY): Payer: BC Managed Care – PPO | Admitting: Anesthesiology

## 2012-12-05 ENCOUNTER — Encounter (HOSPITAL_COMMUNITY): Payer: Self-pay | Admitting: *Deleted

## 2012-12-05 ENCOUNTER — Encounter (HOSPITAL_COMMUNITY): Admission: EM | Disposition: A | Discharge status: Home / Self Care | Payer: Self-pay | Source: Home / Self Care

## 2012-12-05 HISTORY — PX: ERCP: SHX5425

## 2012-12-05 LAB — CBC
HCT: 36.1 % (ref 36.0–46.0)
Hemoglobin: 11.1 g/dL — ABNORMAL LOW (ref 12.0–15.0)
MCH: 24.9 pg — ABNORMAL LOW (ref 26.0–34.0)
MCHC: 30.7 g/dL (ref 30.0–36.0)
RDW: 16.4 % — ABNORMAL HIGH (ref 11.5–15.5)

## 2012-12-05 LAB — COMPREHENSIVE METABOLIC PANEL
Albumin: 3.3 g/dL — ABNORMAL LOW (ref 3.5–5.2)
BUN: 4 mg/dL — ABNORMAL LOW (ref 6–23)
Calcium: 9.5 mg/dL (ref 8.4–10.5)
GFR calc Af Amer: >90 mL/min (ref >90)
Glucose, Bld: 46 mg/dL — ABNORMAL LOW (ref 70–99)
Sodium: 138 mEq/L (ref 135–145)
Total Protein: 6.5 g/dL (ref 6.0–8.3)

## 2012-12-05 LAB — LIPASE, BLOOD: Lipase: 297 U/L — ABNORMAL HIGH (ref 11–59)

## 2012-12-05 LAB — GLUCOSE, CAPILLARY: Glucose-Capillary: 161 mg/dL — ABNORMAL HIGH (ref 70–99)

## 2012-12-05 SURGERY — ERCP, WITH INTERVENTION IF INDICATED
Anesthesia: Monitor Anesthesia Care

## 2012-12-05 MED ORDER — LACTATED RINGERS IV SOLN
INTRAVENOUS | Status: DC
  Administered 2012-12-05: 1000 mL via INTRAVENOUS

## 2012-12-05 MED ORDER — HYDROMORPHONE HCL PF 1 MG/ML IJ SOLN
INTRAMUSCULAR | Status: AC
  Filled 2012-12-05: qty 1

## 2012-12-05 MED ORDER — GLUCAGON HCL (RDNA) 1 MG IJ SOLR
INTRAMUSCULAR | Status: DC | PRN
  Administered 2012-12-05: .5 mg via INTRAVENOUS

## 2012-12-05 MED ORDER — SODIUM CHLORIDE 0.9 % IV SOLN: INTRAVENOUS | Status: DC

## 2012-12-05 MED ORDER — CEFAZOLIN SODIUM 1-5 GM-% IV SOLN
INTRAVENOUS | Status: DC | PRN
  Administered 2012-12-05: 1 g via INTRAVENOUS

## 2012-12-05 MED ORDER — ONDANSETRON HCL 4 MG/2ML IJ SOLN
INTRAMUSCULAR | Status: DC | PRN
  Administered 2012-12-05: 4 mg via INTRAVENOUS

## 2012-12-05 MED ORDER — PROMETHAZINE HCL 25 MG/ML IJ SOLN
INTRAMUSCULAR | Status: AC
  Filled 2012-12-05: qty 1

## 2012-12-05 MED ORDER — BUTAMBEN-TETRACAINE-BENZOCAINE 2-2-14 % EX AERO
INHALATION_SPRAY | CUTANEOUS | Status: DC | PRN
  Administered 2012-12-05: 2 via TOPICAL

## 2012-12-05 MED ORDER — KCL IN DEXTROSE-NACL 20-5-0.9 MEQ/L-%-% IV SOLN
INTRAVENOUS | Status: DC
  Administered 2012-12-05: 11:00:00 via INTRAVENOUS
  Administered 2012-12-06: 100 mL/h via INTRAVENOUS
  Filled 2012-12-05 (×4): qty 1000

## 2012-12-05 MED ORDER — MIDAZOLAM HCL 5 MG/5ML IJ SOLN
INTRAMUSCULAR | Status: DC | PRN
  Administered 2012-12-05 (×2): 1 mg via INTRAVENOUS

## 2012-12-05 MED ORDER — DEXTROSE 50 % IV SOLN
INTRAVENOUS | Status: AC
  Administered 2012-12-05: 10:00:00 via INTRAVENOUS
  Filled 2012-12-05: qty 50

## 2012-12-05 MED ORDER — KETAMINE HCL 10 MG/ML IJ SOLN
INTRAMUSCULAR | Status: DC | PRN
  Administered 2012-12-05: 60 mg via INTRAVENOUS

## 2012-12-05 MED ORDER — PROPOFOL 10 MG/ML IV EMUL
INTRAVENOUS | Status: DC | PRN
  Administered 2012-12-05: 140 ug/kg/min via INTRAVENOUS

## 2012-12-05 MED ORDER — LIDOCAINE HCL (CARDIAC) 20 MG/ML IV SOLN
INTRAVENOUS | Status: DC | PRN
  Administered 2012-12-05: 100 mg via INTRAVENOUS

## 2012-12-05 NOTE — Progress Notes (Signed)
1 Day Post-Op  Subjective: Feels about the same, awaiting ERCP.  Objective: Vital signs in last 24 hours: Temp:  [97.6 F (36.4 C)-98.5 F (36.9 C)] 97.8 F (36.6 C) (01/21 0440) Pulse Rate:  [64-89] 72  (01/21 0440) Resp:  [11-20] 20  (01/21 0440) BP: (116-176)/(78-110) 117/78 mmHg (01/21 0440) SpO2:  [93 %-100 %] 98 % (01/21 0440) Last BM Date: 12/04/12  Intake/Output from previous day: 01/20 0701 - 01/21 0700 In: 3273.8 [P.O.:180; I.V.:3093.8] Out: 1500 [Urine:1500] Intake/Output this shift:    General appearance: alert, cooperative and no distress GI: soft, still tender mid epigastric area, below xyphoid, no significant nausea complaints this AM.  Incisions look very good.  Lab Results:   Basename 12/05/12 0459 12/03/12 0445  WBC 4.8 7.0  HGB 11.1* 12.2  HCT 36.1 38.3  PLT 196 250    BMET  Basename 12/05/12 0459 12/04/12 0423  NA 138 143  K 4.5 3.3*  CL 101 102  CO2 25 30  GLUCOSE 46* 102*  BUN 4* <3*  CREATININE 0.53 0.57  CALCIUM 9.5 9.2   PT/INR No results found for this basename: LABPROT:2,INR:2 in the last 72 hours   Lab 12/05/12 0459 12/04/12 0423 12/03/12 0445 12/01/12 0400 11/30/12 0400  AST 74* 153* 225* 129* 408*  ALT 212* 270* 313* 306* 456*  ALKPHOS 459* 405* 406* 293* 299*  BILITOT 2.4* 4.4* 4.5* 1.2 2.1*  PROT 6.5 6.0 6.5 6.2 6.1  ALBUMIN 3.3* 3.1* 3.4* 3.1* 3.1*     Lipase     Component Value Date/Time   LIPASE 297* 12/05/2012 0459     Studies/Results: Dg C-arm 1-60 Min-no Report  12/04/2012  CLINICAL DATA: cbd stones   C-ARM 1-60 MINUTES  Fluoroscopy was utilized by the requesting physician.  No radiographic  interpretation.      Medications:    . antiseptic oral rinse  15 mL Mouth Rinse q12n4p  . chlorhexidine  15 mL Mouth Rinse BID  . lip balm  1 application Topical BID    Assessment/Plan Gallstone Pancreatitis, s/p LAPAROSCOPIC CHOLECYSTECTOMY WITH INTRAOPERATIVE CHOLANGIOGRAM  Romie Levee, MD, 12/02/2012  C  section 10/04/12  Plan:  ERCP today, Bil down some, Lipase up some, but no real change in exam.      LOS: 6 days    Candon Caras 12/05/2012

## 2012-12-05 NOTE — Progress Notes (Signed)
[facility] Progress Note  Subjective: Patient still having moderate abdominal pain  Objective: Vital signs in last 24 hours: Temp:  [97.5 F (36.4 C)-98.5 F (36.9 C)] 97.5 F (36.4 C) (01/21 1231) Pulse Rate:  [71-83] 83  (01/21 0927) Resp:  [15-22] 22  (01/21 1231) BP: (99-134)/(62-99) 121/86 mmHg (01/21 1622) SpO2:  [97 %-99 %] 99 % (01/21 1231) Weight change:    ZO:XWRU epigastric tenderness  Lab Results: Results for orders placed during the hospital encounter of 11/29/12 (from the past 24 hour(s))  CBC     Status: Abnormal   Collection Time   12/05/12  4:59 AM      Component Value Range   WBC 4.8  4.0 - 10.5 K/uL   RBC 4.46  3.87 - 5.11 MIL/uL   Hemoglobin 11.1 (*) 12.0 - 15.0 g/dL   HCT 04.5  40.9 - 81.1 %   MCV 80.9  78.0 - 100.0 fL   MCH 24.9 (*) 26.0 - 34.0 pg   MCHC 30.7  30.0 - 36.0 g/dL   RDW 91.4 (*) 78.2 - 95.6 %   Platelets 196  150 - 400 K/uL  COMPREHENSIVE METABOLIC PANEL     Status: Abnormal   Collection Time   12/05/12  4:59 AM      Component Value Range   Sodium 138  135 - 145 mEq/L   Potassium 4.5  3.5 - 5.1 mEq/L   Chloride 101  96 - 112 mEq/L   CO2 25  19 - 32 mEq/L   Glucose, Bld 46 (*) 70 - 99 mg/dL   BUN 4 (*) 6 - 23 mg/dL   Creatinine, Ser 2.13  0.50 - 1.10 mg/dL   Calcium 9.5  8.4 - 08.6 mg/dL   Total Protein 6.5  6.0 - 8.3 g/dL   Albumin 3.3 (*) 3.5 - 5.2 g/dL   AST 74 (*) 0 - 37 U/L   ALT 212 (*) 0 - 35 U/L   Alkaline Phosphatase 459 (*) 39 - 117 U/L   Total Bilirubin 2.4 (*) 0.3 - 1.2 mg/dL   GFR calc non Af Amer >90  >90 mL/min   GFR calc Af Amer >90  >90 mL/min  LIPASE, BLOOD     Status: Abnormal   Collection Time   12/05/12  4:59 AM      Component Value Range   Lipase 297 (*) 11 - 59 U/L  GLUCOSE, CAPILLARY     Status: Abnormal   Collection Time   12/05/12  9:29 AM      Component Value Range   Glucose-Capillary 41 (*) 70 - 99 mg/dL  GLUCOSE, CAPILLARY     Status: Abnormal   Collection Time   12/05/12  9:56 AM        Component Value Range   Glucose-Capillary 161 (*) 70 - 99 mg/dL    Studies/Results: Dg C-arm 1-60 Min-no Report  12/04/2012  CLINICAL DATA: cbd stones   C-ARM 1-60 MINUTES  Fluoroscopy was utilized by the requesting physician.  No radiographic  interpretation.        Assessment:  Suspect common bile duct stones. ERCP was equivocal for possible distal common bile duct stones, sphincterotomy performed and several balloon sweeps made with no stone seen to be delivered in good bile flow at the end of the procedure   Plan: Observe overnight for complications and check liver function tests in the morning.    Gennett Garcia C 12/05/2012, 4:33 PM

## 2012-12-05 NOTE — Progress Notes (Signed)
0950 Rechecked blood sugar 161, W.Jennings PA  Paged acknowledged will proceed with orders.  Sharrell Ku RN

## 2012-12-05 NOTE — Progress Notes (Signed)
Discussed in long length of stay meeting. 

## 2012-12-05 NOTE — Transfer of Care (Signed)
Immediate Anesthesia Transfer of Care Note  Patient: Valerie Cortez  Procedure(s) Performed: Procedure(s) (LRB): ENDOSCOPIC RETROGRADE CHOLANGIOPANCREATOGRAPHY (ERCP) (N/A)  Patient Location: PACU  Anesthesia Type: MAC  Level of Consciousness: sedated, patient cooperative and responds to stimulaton  Airway & Oxygen Therapy: Patient Spontanous Breathing and Patient connected to face mask oxgen  Post-op Assessment: Report given to PACU RN and Post -op Vital signs reviewed and stable  Post vital signs: Reviewed and stable  Complications: No apparent anesthesia complications

## 2012-12-05 NOTE — Op Note (Signed)
[facility] [address] [place], [address]   ERCP PROCEDURE REPORT  PATIENT: Valerie Cortez, Valerie Cortez.  MR# :[id number] BIRTHDATE: [birth year]  GENDER: Female ENDOSCOPIST: Dorena Cookey, MD REFERRED BY: PROCEDURE DATE:  12/05/2012 PROCEDURE: ASA CLASS: INDICATIONS: MEDICATIONS:   and per general anesthesia TOPICAL ANESTHETIC: Cetacaine spray  DESCRIPTION OF PROCEDURE:   After the risks benefits and alternatives of the procedure were thoroughly explained, informed consent was obtained.  The Pentax side-viewing        endoscope was introduced through the mouth  and advanced to the papilla of Vater      .  it had a normal somewhat smallish appearance with a small intraduodenal segment. Initial cannulation with the sphincterotome. We could not achieve deep cannulation except with a wire and only into the pancreatic duct on multiple attempts. Eventually was decided to place a 5 [place] 3 cm pancreatic duct stent to try to facilitate cannulation of the common bile duct. After multiple attempts selective cannulation of the common bile duct was achieved with the guidewire and followed up with the sphincterotome. A cholangiogram was obtained which showed a slightly dilated common bile duct. There may have been some tiny filling defects in the distal duct but this was not clearly apparent. A sphincterotomy was performed and a 15 French balloon catheter was advanced into the common hepatic duct and pulled through the ampulla multiple times with no stone fragments ever seen to be delivered. There was good drainage at the end of the procedure.        The scope was then completely withdrawn from the patient and the procedure terminated.     COMPLICATIONS: none immediate  ENDOSCOPIC IMPRESSION: possible small distal him in bile duct stones, status post sphincterotomy and balloon sweep  RECOMMENDATIONS:Observe overnight for possible complications and monitor liver function  tests.     _______________________________ Rosalie DoctorDorena Cookey, MD 12/05/2012 4:30 PM   CC:

## 2012-12-05 NOTE — Preoperative (Signed)
Beta Blockers   Reason not to administer Beta Blockers:Not Applicable 

## 2012-12-05 NOTE — Progress Notes (Signed)
0930 pt feels faint b/p 110/64/ blood sugar 41,  one amp D 50 pushed slowly  Will continue to monitor. Sharrell Ku RN

## 2012-12-05 NOTE — Anesthesia Preprocedure Evaluation (Addendum)
Anesthesia Evaluation  Patient identified by MRN, date of birth, ID band Patient awake    Reviewed: Allergy & Precautions, H&P , NPO status , Patient's Chart, lab work & pertinent test results, reviewed documented beta blocker date and time   History of Anesthesia Complications (+) DIFFICULT AIRWAY  Airway Mallampati: II TM Distance: >3 FB Neck ROM: full    Dental No notable dental hx.    Pulmonary neg pulmonary ROS,  breath sounds clear to auscultation  Pulmonary exam normal       Cardiovascular Exercise Tolerance: Good negative cardio ROS  Rhythm:regular Rate:Normal     Neuro/Psych  Headaches, negative neurological ROS  negative psych ROS   GI/Hepatic negative GI ROS, Neg liver ROS,   Endo/Other  negative endocrine ROS  Renal/GU negative Renal ROS  negative genitourinary   Musculoskeletal   Abdominal   Peds  Hematology negative hematology ROS (+)   Anesthesia Other Findings   Reproductive/Obstetrics Denies pregnancy, recent menses 11/30/12                          Anesthesia Physical Anesthesia Plan  ASA: II  Anesthesia Plan: MAC   Post-op Pain Management:    Induction:   Airway Management Planned:   Additional Equipment:   Intra-op Plan:   Post-operative Plan:   Informed Consent: I have reviewed the patients History and Physical, chart, labs and discussed the procedure including the risks, benefits and alternatives for the proposed anesthesia with the patient or authorized representative who has indicated his/her understanding and acceptance.   Dental Advisory Given  Plan Discussed with: CRNA  Anesthesia Plan Comments:         Anesthesia Quick Evaluation

## 2012-12-06 LAB — COMPREHENSIVE METABOLIC PANEL
ALT: 168 U/L — ABNORMAL HIGH (ref 0–35)
AST: 57 U/L — ABNORMAL HIGH (ref 0–37)
Albumin: 3.2 g/dL — ABNORMAL LOW (ref 3.5–5.2)
Alkaline Phosphatase: 491 U/L — ABNORMAL HIGH (ref 39–117)
CO2: 23 mEq/L (ref 19–32)
Chloride: 99 mEq/L (ref 96–112)
Potassium: 3.8 mEq/L (ref 3.5–5.1)
Sodium: 135 mEq/L (ref 135–145)
Total Bilirubin: 1.6 mg/dL — ABNORMAL HIGH (ref 0.3–1.2)

## 2012-12-06 MED ORDER — TRAMADOL HCL 50 MG PO TABS: 50.0000 mg | ORAL_TABLET | Freq: Four times a day (QID) | ORAL | Status: DC | PRN

## 2012-12-06 NOTE — Progress Notes (Signed)
Labs better, some epigastric pain after eating will need to monitor, will check back later today to see if able to be discharged.

## 2012-12-06 NOTE — Progress Notes (Signed)
CARE MANAGEMENT NOTE 12/06/2012  Patient:  Valerie Cortez, Valerie Cortez   Account Number:  [id number]  Date Initiated:  12/04/2012  Documentation initiated by:  Colleen Can  Subjective/Objective Assessment:   dx gallstones, pancreatitis     Action/Plan:   Home upon discharge where spouse will be caregiver. Pt to ambulating in hallawy and BR without assistance. zno DME or HH needs assessed.   Anticipated DC Date:  12/07/2012   Anticipated DC Plan:  HOME/SELF CARE      DC Planning Services  CM consult      Choice offered to / List presented to:             Status of service:  Completed, signed off Medicare Important Message given?  NO (If response is "NO", the following Medicare IM given date fields will be blank) Date Medicare IM given:   Date Additional Medicare IM given:    Discharge Disposition:    Per UR Regulation:  Reviewed for med. necessity/level of care/duration of stay  If discussed at Long Length of Stay Meetings, dates discussed:  12/05/2012

## 2012-12-06 NOTE — Progress Notes (Signed)
Pt has been up and walking feels better.  Tomato soup and grilled cheese for lunch.  She would like to go home.  Discussed staying tonight and being sure she was OK, but she wants to go. Plan home on pain meds only! She was on dicloxicillin pre admit, but finished it day of admit, for mastitis.

## 2012-12-06 NOTE — Progress Notes (Signed)
Patient ID: Valerie Cortez, female   DOB: 01-Oct-1991, 21 y.o.   MRN: [id number] Patient has noted improvement in her abdominal pain overnight though slightly increased after eating grits and a biscuit this morning. Her LFTs are all improved. Plan is for possible discharge this afternoon depending on clinical course. Will sign off for now.

## 2012-12-06 NOTE — Progress Notes (Signed)
1 Day Post-Op  Subjective: She is feeling much better, still on clears, wants to go slow with diet.    Objective: Vital signs in last 24 hours: Temp:  [97.5 F (36.4 C)-97.9 F (36.6 C)] 97.9 F (36.6 C) (01/22 0656) Pulse Rate:  [65-83] 65  (01/22 0656) Resp:  [12-22] 14  (01/22 0656) BP: (99-147)/(47-89) 109/71 mmHg (01/22 0656) SpO2:  [95 %-100 %] 97 % (01/22 0656) Last BM Date: 12/04/12  Diet: clears, 240 PO recorded, afebrile, VSS, Bilirubin down to 1.6  Intake/Output from previous day: 01/21 0701 - 01/22 0700 In: 3025.8 [I.V.:3025.8] Out: 1800 [Urine:1800] Intake/Output this shift:    General appearance: alert, cooperative and no distress GI: soft, non-tender; bowel sounds normal; no masses,  no organomegaly and incisions look great.  Lab Results:   [facility] 12/05/12 0459  WBC 4.8  HGB 11.1*  HCT 36.1  PLT 196    BMET  Basename 12/06/12 0450 12/05/12 0459  NA 135 138  K 3.8 4.5  CL 99 101  CO2 23 25  GLUCOSE 82 46*  BUN 3* 4*  CREATININE 0.55 0.53  CALCIUM 9.2 9.5   PT/INR No results found for this basename: LABPROT:2,INR:2 in the last 72 hours   Lab 12/06/12 0450 12/05/12 0459 12/04/12 0423 12/03/12 0445 12/01/12 0400  AST 57* 74* 153* 225* 129*  ALT 168* 212* 270* 313* 306*  ALKPHOS 491* 459* 405* 406* 293*  BILITOT 1.6* 2.4* 4.4* 4.5* 1.2  PROT 6.3 6.5 6.0 6.5 6.2  ALBUMIN 3.2* 3.3* 3.1* 3.4* 3.1*     Lipase     Component Value Date/Time   LIPASE 297* 12/05/2012 0459     Studies/Results: Dg Ercp With Sphincterotomy  12/05/2012  *RADIOLOGY REPORT*  Clinical Data: CBD stones, ERCP with sphincterotomy  ERCP  Comparison:  Intraoperative cholangiogram - 12/02/2012; abdominal MRI/MRCP - 11/30/2012; abdominal ultrasound - 11/29/2012  Findings:  Five spot intraoperative fluoroscopic images of the right upper abdominal quadrant during ERCP are provided for review.  Images demonstrate an ERCP probe overlying the right upper abdominal quadrant.   Initial images demonstrate selective cannulation of the pancreatic duct which is normal in appearance.  Subsequent images demonstrate selective cannulation of the common bile duct which appears nondilated.  No discrete filling defects are seen within the CBD or opacified portions of the intrahepatic biliary system to suggest the presence of choledocholithiasis.  IMPRESSION: ERCP as above.  No discrete filling defects to suggest the presence of choledocholithiasis.   Original Report Authenticated By: Tacey Ruiz, MD    Dg C-arm 1-60 Min-no Report  12/04/2012  CLINICAL DATA: cbd stones   C-ARM 1-60 MINUTES  Fluoroscopy was utilized by the requesting physician.  No radiographic  interpretation.      Medications:    . antiseptic oral rinse  15 mL Mouth Rinse q12n4p  . chlorhexidine  15 mL Mouth Rinse BID  . lip balm  1 application Topical BID    Assessment/Plan Gallstone Pancreatitis, s/p LAPAROSCOPIC CHOLECYSTECTOMY WITH INTRAOPERATIVE CHOLANGIOGRAM  Valerie Levee, MD, 12/02/2012  ERCP WITH sphincterotomy and balloon sweep 12/05/12 Dr. Dorena Cookey. C section 10/04/12   Plan:  Advance diet as tolerated, po's for pain, mobilize, and if she is doing well home later today. Follow up in 2 weeks.     LOS: 7 days    Valerie Cortez 12/06/2012

## 2012-12-07 ENCOUNTER — Encounter (HOSPITAL_COMMUNITY): Payer: Self-pay | Admitting: Gastroenterology

## 2012-12-07 NOTE — Discharge Summary (Signed)
Physician Discharge Summary  Patient ID: Valerie Cortez MRN: [id number] DOB/AGE: 1991/11/15 22 y.o.  Admit date: 11/29/2012 Discharge date: 12/07/2012  Admission Diagnoses: Gallstone pancreatitis  Discharge Diagnoses: Gallstone Pancreatitis, s/p LAPAROSCOPIC CHOLECYSTECTOMY WITH INTRAOPERATIVE CHOLANGIOGRAM  Romie Levee, MD, 12/02/2012  ERCP WITH sphincterotomy and balloon sweep 12/05/12 Dr. Dorena Cookey.  C section 10/04/12  Principal Problem:  *Gallstone pancreatitis Active Problems:  Constipation   PROCEDURES:  1. LAPAROSCOPIC CHOLECYSTECTOMY WITH INTRAOPERATIVE CHOLANGIOGRAM  Romie Levee, MD, 12/02/2012  2.  ERCP WITH sphincterotomy and balloon sweep 12/05/12 Dr. Dorena Cookey.  3. Attempted ERCP 12/04/12 Dr. Alric Ran Course: day history of nausea vomiting and epigastric abdominal pain Got outpatient U/S today that shows gallstones. Seen in ED. Elevated lipase, Lfts. Asked to see at request of Dr Silverio Lay. Still has some epigastric pain. Severe. Nausea an issue. 8 weeks post delivery Pt was admitted and place on bowel rest, hydrated and treated for her pain.  She was seen by the GI service and improved enough to undergo Cholecystectomy on 12/02/12 by Dr. Maisie Fus.  Her LFT's remained elevated, she continued to have some pain, and BM were reported to be light in color.  She had had some trouble with narcotics and 1st attempt at ERCP on 1/20 had to be aborted. She then had successful ERCP on 12/05/12.  The next day she felt much better,  LFT's were improving and by the afternoon she wanted to go home.  She will follow up with the [facility] clinic or Dr. Maisie Fus in 2 weeks.  We currently have no plans to repeat labs.  Condition on D/C:  Improved   Disposition: 01-Home or Self Care  Discharge Orders    Future Appointments: Provider: Department: Dept Phone: Center:   12/28/2012 4:20 PM Romie Levee, MD [facility], [place] [phone] None       Medication List     As of 12/07/2012  11:00 AM    STOP taking these medications         dicloxacillin 500 MG capsule   Commonly known as: DYNAPEN      TAKE these medications         acetaminophen 500 MG tablet   Commonly known as: TYLENOL   Take 1,000 mg by mouth every 6 (six) hours as needed. For pain.      albuterol 108 (90 BASE) MCG/ACT inhaler   Commonly known as: PROVENTIL HFA;VENTOLIN HFA   Inhale 2 puffs into the lungs every 6 (six) hours as needed. For shortness of breath      ferrous fumarate 325 (106 FE) MG Tabs   Commonly known as: HEMOCYTE - 106 mg FE   Take 1 tablet by mouth 2 (two) times daily.      prenatal multivitamin Tabs   Take 1 tablet by mouth at bedtime.      ranitidine 75 MG tablet   Commonly known as: ZANTAC   Take 75 mg by mouth daily.      traMADol 50 MG tablet   Commonly known as: ULTRAM   Take 1-2 tablets (50-100 mg total) by mouth every 6 (six) hours as needed.           Follow-up Information    Schedule an appointment as soon as possible for a visit with Vanita Panda., MD. (Ask for an appointment in the [facility] clinic or with Dr. Maisie Fus.)    Contact information:   [address] [place] [address] [phone]  Schedule an appointment as soon as possible for a visit with Bing Plume, MD. (Let him know you had surgery and follow up as needed.)    Contact information:   [address], [address] [place] [address] [phone]          Signed: Sherrie George 12/07/2012, 11:00 AM

## 2012-12-07 NOTE — Progress Notes (Signed)
Discharge summary sent to payer through [facility]  

## 2012-12-15 NOTE — Anesthesia Postprocedure Evaluation (Signed)
  Anesthesia Post-op Note  Patient: Valerie Cortez  Procedure(s) Performed: Procedure(s) (LRB): ENDOSCOPIC RETROGRADE CHOLANGIOPANCREATOGRAPHY (ERCP) (N/A)  Patient Location: PACU  Anesthesia Type: General  Level of Consciousness: awake and alert   Airway and Oxygen Therapy: Patient Spontanous Breathing  Post-op Pain: mild  Post-op Assessment: Post-op Vital signs reviewed, Patient's Cardiovascular Status Stable, Respiratory Function Stable, Patent Airway and No signs of Nausea or vomiting  Last Vitals:  Filed Vitals:   12/06/12 0656  BP: 109/71  Pulse: 65  Temp: 36.6 C  Resp: 14    Post-op Vital Signs: stable   Complications: No apparent anesthesia complications

## 2012-12-28 ENCOUNTER — Encounter (INDEPENDENT_AMBULATORY_CARE_PROVIDER_SITE_OTHER): Payer: BC Managed Care – PPO | Admitting: General Surgery

## 2012-12-29 ENCOUNTER — Encounter (INDEPENDENT_AMBULATORY_CARE_PROVIDER_SITE_OTHER): Payer: BC Managed Care – PPO | Admitting: General Surgery

## 2013-01-09 ENCOUNTER — Encounter (INDEPENDENT_AMBULATORY_CARE_PROVIDER_SITE_OTHER): Payer: Self-pay | Admitting: General Surgery

## 2013-01-09 ENCOUNTER — Ambulatory Visit
Admission: RE | Admit: 2013-01-09 | Discharge: 2013-01-09 | Disposition: A | Discharge status: Home / Self Care | Payer: BC Managed Care – PPO | Source: Ambulatory Visit | Attending: General Surgery | Admitting: General Surgery

## 2013-01-09 ENCOUNTER — Ambulatory Visit (INDEPENDENT_AMBULATORY_CARE_PROVIDER_SITE_OTHER): Payer: BC Managed Care – PPO | Admitting: General Surgery

## 2013-01-09 VITALS — BP 110/70 | HR 74 | Temp 97.0°F | Ht 62.0 in | Wt 139.5 lb

## 2013-01-09 DIAGNOSIS — R0602 Shortness of breath: Secondary | ICD-10-CM

## 2013-01-09 DIAGNOSIS — Z9089 Acquired absence of other organs: Secondary | ICD-10-CM

## 2013-01-09 DIAGNOSIS — Z9049 Acquired absence of other specified parts of digestive tract: Secondary | ICD-10-CM

## 2013-01-09 NOTE — Patient Instructions (Signed)
Continue a low fat diet.  It is ok to increase your activity but no heavy lifting for 6 weeks after surgery.

## 2013-01-09 NOTE — Progress Notes (Signed)
Valerie Cortez is a 22 y.o. female who is status post a laparoscopic cholecystectomy on 1/18 for gallstone pancreatitis.  She subsequently underwent an ERCP for retained CBD stone seen on cholangiography.  She is doing better now.  She still has some band-like pain in her abdomen.  She is also having frequent diarrhea.  She complains of shortness of breath with a nonproductive cough as well.  She denies any fevers or incisional pain.    Objective: Filed Vitals:   01/09/13 0943  BP: 110/70  Pulse: 74  Temp: 97 F (36.1 C)    General appearance: alert and cooperative GI: soft, non-tender; bowel sounds normal; no masses,  no organomegaly Resp: CTA  Incision: healing well, vicryl knot noted at corner of incision   Assessment: s/p  Patient Active Problem List  Diagnosis  . Constipation  . Gallstone pancreatitis    Plan: She is doing relatively well after her gallstone pancreatitis.  We discussed that she may have continued pain and diarrhea after these events but that I would expect it to keep getting better as she moved forward.  I will get a CXR to make sure she is not developing a fluid collection in her lungs.  I will see her back in 4 wks to make sure that her abd symptoms are getting better.      Vanita Panda, MD [facility], [place] [phone]   01/09/2013 10:00 AM

## 2013-01-11 ENCOUNTER — Emergency Department (HOSPITAL_COMMUNITY): Payer: BC Managed Care – PPO

## 2013-01-11 ENCOUNTER — Emergency Department (HOSPITAL_COMMUNITY)
Admission: EM | Admit: 2013-01-11 | Discharge: 2013-01-11 | Disposition: A | Discharge status: Home / Self Care | Payer: BC Managed Care – PPO | Attending: Emergency Medicine | Admitting: Emergency Medicine

## 2013-01-11 ENCOUNTER — Encounter (HOSPITAL_COMMUNITY): Payer: Self-pay

## 2013-01-11 DIAGNOSIS — R112 Nausea with vomiting, unspecified: Secondary | ICD-10-CM | POA: Insufficient documentation

## 2013-01-11 DIAGNOSIS — G8929 Other chronic pain: Secondary | ICD-10-CM

## 2013-01-11 DIAGNOSIS — J189 Pneumonia, unspecified organism: Secondary | ICD-10-CM | POA: Insufficient documentation

## 2013-01-11 DIAGNOSIS — R109 Unspecified abdominal pain: Secondary | ICD-10-CM | POA: Insufficient documentation

## 2013-01-11 DIAGNOSIS — Z8619 Personal history of other infectious and parasitic diseases: Secondary | ICD-10-CM | POA: Insufficient documentation

## 2013-01-11 DIAGNOSIS — R197 Diarrhea, unspecified: Secondary | ICD-10-CM | POA: Insufficient documentation

## 2013-01-11 DIAGNOSIS — Z8719 Personal history of other diseases of the digestive system: Secondary | ICD-10-CM | POA: Insufficient documentation

## 2013-01-11 DIAGNOSIS — R0789 Other chest pain: Secondary | ICD-10-CM | POA: Insufficient documentation

## 2013-01-11 DIAGNOSIS — Z8679 Personal history of other diseases of the circulatory system: Secondary | ICD-10-CM | POA: Insufficient documentation

## 2013-01-11 DIAGNOSIS — IMO0002 Reserved for concepts with insufficient information to code with codable children: Secondary | ICD-10-CM | POA: Insufficient documentation

## 2013-01-11 DIAGNOSIS — Z79899 Other long term (current) drug therapy: Secondary | ICD-10-CM | POA: Insufficient documentation

## 2013-01-11 HISTORY — DX: Acute pancreatitis without necrosis or infection, unspecified: K85.90

## 2013-01-11 LAB — COMPREHENSIVE METABOLIC PANEL
ALT: 22 U/L (ref 0–35)
BUN: 10 mg/dL (ref 6–23)
Calcium: 9.3 mg/dL (ref 8.4–10.5)
GFR calc Af Amer: >90 mL/min (ref >90)
Glucose, Bld: 103 mg/dL — ABNORMAL HIGH (ref 70–99)
Sodium: 135 mEq/L (ref 135–145)
Total Protein: 7.8 g/dL (ref 6.0–8.3)

## 2013-01-11 LAB — CBC WITH DIFFERENTIAL/PLATELET
Basophils Relative: 0 % (ref 0–1)
Eosinophils Absolute: 0 10*3/uL (ref 0.0–0.7)
Eosinophils Relative: 0 % (ref 0–5)
Lymphs Abs: 2.1 10*3/uL (ref 0.7–4.0)
MCH: 25.5 pg — ABNORMAL LOW (ref 26.0–34.0)
MCHC: 33.2 g/dL (ref 30.0–36.0)
MCV: 76.6 fL — ABNORMAL LOW (ref 78.0–100.0)
Platelets: 212 10*3/uL (ref 150–400)
RBC: 4.91 MIL/uL (ref 3.87–5.11)

## 2013-01-11 LAB — URINALYSIS, ROUTINE W REFLEX MICROSCOPIC
Nitrite: NEGATIVE
Specific Gravity, Urine: 1.015 (ref 1.005–1.030)
Urobilinogen, UA: 0.2 mg/dL (ref 0.0–1.0)

## 2013-01-11 LAB — LIPASE, BLOOD: Lipase: 63 U/L — ABNORMAL HIGH (ref 11–59)

## 2013-01-11 MED ORDER — IOHEXOL 300 MG/ML  SOLN
100.0000 mL | Freq: Once | INTRAMUSCULAR | Status: AC | PRN
  Administered 2013-01-11: 100 mL via INTRAVENOUS

## 2013-01-11 MED ORDER — LEVOFLOXACIN 750 MG PO TABS: 750.0000 mg | ORAL_TABLET | Freq: Every day | ORAL | Status: DC

## 2013-01-11 MED ORDER — LEVOFLOXACIN IN D5W 750 MG/150ML IV SOLN
750.0000 mg | Freq: Once | INTRAVENOUS | Status: AC
  Administered 2013-01-11: 750 mg via INTRAVENOUS
  Filled 2013-01-11: qty 150

## 2013-01-11 MED ORDER — ONDANSETRON HCL 4 MG/2ML IJ SOLN
4.0000 mg | Freq: Once | INTRAMUSCULAR | Status: AC
  Administered 2013-01-11: 4 mg via INTRAVENOUS
  Filled 2013-01-11: qty 2

## 2013-01-11 MED ORDER — MORPHINE SULFATE 4 MG/ML IJ SOLN
4.0000 mg | Freq: Once | INTRAMUSCULAR | Status: AC
  Administered 2013-01-11: 4 mg via INTRAVENOUS
  Filled 2013-01-11: qty 1

## 2013-01-11 MED ORDER — SODIUM CHLORIDE 0.9 % IV SOLN
1000.0000 mL | INTRAVENOUS | Status: DC
  Administered 2013-01-11 (×2): 1000 mL via INTRAVENOUS

## 2013-01-11 MED ORDER — MUCINEX DM 30-600 MG PO TB12: ORAL_TABLET | ORAL | Status: DC

## 2013-01-11 MED ORDER — SODIUM CHLORIDE 0.9 % IV BOLUS (SEPSIS)
1000.0000 mL | Freq: Once | INTRAVENOUS | Status: AC
  Administered 2013-01-11: 1000 mL via INTRAVENOUS

## 2013-01-11 MED ORDER — HYDROCODONE-ACETAMINOPHEN 5-325 MG PO TABS: ORAL_TABLET | ORAL | Status: DC

## 2013-01-11 MED ORDER — SODIUM CHLORIDE 0.9 % IV SOLN
1000.0000 mL | Freq: Once | INTRAVENOUS | Status: AC
  Administered 2013-01-11: 1000 mL via INTRAVENOUS

## 2013-01-11 MED ORDER — LORAZEPAM 2 MG/ML IJ SOLN
0.5000 mg | Freq: Once | INTRAMUSCULAR | Status: AC
  Administered 2013-01-11: 0.5 mg via INTRAVENOUS
  Filled 2013-01-11: qty 1

## 2013-01-11 MED ORDER — IOHEXOL 300 MG/ML  SOLN
25.0000 mL | Freq: Once | INTRAMUSCULAR | Status: AC | PRN
  Administered 2013-01-11: 25 mL via ORAL

## 2013-01-11 MED ORDER — ONDANSETRON 8 MG PO TBDP: 8.0000 mg | ORAL_TABLET | Freq: Three times a day (TID) | ORAL | Status: DC | PRN

## 2013-01-11 NOTE — ED Notes (Signed)
Patient hyperventilating.  Put 02 on patient and told her to take slow deep breaths.  Dr. Lynelle Doctor notified.  Patient also having tingling in her hands, face, and feet.

## 2013-01-11 NOTE — ED Notes (Signed)
Pt. Is unable to use the restroom at this time, she is aware that we need urine. Pt. Said she would let us know when she could use the restroom.

## 2013-01-11 NOTE — ED Provider Notes (Signed)
History     CSN: [id number]  Arrival date & time 01/11/13  1638   First MD Initiated Contact with Patient 01/11/13 1713      Chief Complaint  Patient presents with  . Abdominal Pain    (Consider location/radiation/quality/duration/timing/severity/associated sxs/prior treatment) HPI Patient reports she had a C-section on November 20 for a 7 month pregnancy. She reports that during her pregnancy she started having abdominal pain which they thought were contractions. She evidently was found to have gallstones and on January 18 she had a cholecystectomy complicated by a common bile duct stone and had ERCP done twice and had a spincterotomy done. She reports however for the past month she has had constant upper abdominal pain, which was initially epigastric radiating into her back and she states now it's in her right upper quadrant and into her left upper quadrant and radiates up into her chest. She states the pain has been constant for one month. She states the pain never goes away. She states the pain in her chest as a tightness. The pain in her abdomen and back is sharp and aching.  She states she's having nausea and vomiting about 5-6 times a day and having diarrhea about 7 times a day that's watery and tan in color. She denies fever or chills. Reports she has lost just under 25 pounds. She states any type of food or drinking liquids or certain movements will make her pain feel worse. She states it feels better if she props herself up on pillows. She was seen on 2/25 at [facility] for chest pain and she was found to have a lipase of 705 with high normal being 300 and she had a CT angio chest showing a slightly cavitating lesion of 1.9 cm in the right lower lobe felt to be possibly pneumonia or inflammatory process. She was discharged from the emergency department on a Z-Pak. Patient had an appointment this afternoon with her gastroenterologist Dr. Madilyn Fireman however he had her come to the  emergency room for further evaluation.   PCP PA Anna Genre in [place] (Dr Nathanial Rancher)  Past Medical History  Diagnosis Date  . Constipation     treated with diet change  . Headache     migraines  . Hx of chlamydia infection   . Difficult intubation   . Pancreatitis     Past Surgical History  Procedure Laterality Date  . Wisdom tooth extraction    . Cesarean section  10/04/2012    Procedure: CESAREAN SECTION;  Surgeon: Bing Plume, MD;  Location: [facility];  Service: Obstetrics;  Laterality: N/A;  . Cholecystectomy  12/02/2012    Procedure: LAPAROSCOPIC CHOLECYSTECTOMY WITH INTRAOPERATIVE CHOLANGIOGRAM;  Surgeon: Romie Levee, MD;  Location: [facility];  Service: General;  Laterality: N/A;  . Ercp  12/04/2012    Procedure: ENDOSCOPIC RETROGRADE CHOLANGIOPANCREATOGRAPHY (ERCP);  Surgeon: Barrie Folk, MD;  Location: [facility];  Service: Endoscopy;  Laterality: N/A;  . Ercp  12/05/2012    Procedure: ENDOSCOPIC RETROGRADE CHOLANGIOPANCREATOGRAPHY (ERCP);  Surgeon: Barrie Folk, MD;  Location: [facility];  Service: Endoscopy;  Laterality: N/A;    Family History  Problem Relation Age of Onset  . Cerebral palsy Mother   . Fibroids Mother   . Diverticulitis Mother   . Migraines Mother   . Asthma Brother   . Cancer Maternal Grandmother     ovarian  . COPD Maternal Grandfather   . Diabetes Maternal Grandfather   . Heart disease Maternal Grandfather  heart valve  . Hypertension Maternal Grandfather   . Cancer Paternal Grandfather     lung    History  Substance Use Topics  . Smoking status: Never Smoker   . Smokeless tobacco: Never Used  . Alcohol Use: No  unemployed Lives at home  OB History   Grav Para Term Preterm Abortions TAB SAB Ect Mult Living   1 1 1       1       Review of Systems  All other systems reviewed and are negative.    Allergies  Fentanyl; Oxycodone; Peanut-containing drug products; and Red dye  Home Medications   Current Outpatient Rx  Name   Route  Sig  Dispense  Refill  . albuterol (PROVENTIL HFA;VENTOLIN HFA) 108 (90 BASE) MCG/ACT inhaler   Inhalation   Inhale 2 puffs into the lungs every 6 (six) hours as needed. For shortness of breath         . ALPRAZolam (XANAX) 0.5 MG tablet   Oral   Take 0.5 mg by mouth at bedtime as needed for sleep. Anxiety         . doxycycline (VIBRAMYCIN) 100 MG capsule   Oral   Take 100 mg by mouth 2 (two) times daily.         Marland Kitchen escitalopram (LEXAPRO) 10 MG tablet   Oral   Take 10 mg by mouth daily.         . predniSONE (DELTASONE) 5 MG tablet   Oral   Take 5 mg by mouth daily.           BP 110/83  Pulse 65  Temp(Src) 97.9 F (36.6 C) (Oral)  Resp 23  SpO2 100%  LMP 01/08/2013 Vital signs normal    Physical Exam  Nursing note and vitals reviewed. Constitutional: She is oriented to person, place, and time. She appears well-developed and well-nourished.  Non-toxic appearance. She does not appear ill. No distress.  HENT:  Head: Normocephalic and atraumatic.  Right Ear: External ear normal.  Left Ear: External ear normal.  Nose: Nose normal. No mucosal edema or rhinorrhea.  Mouth/Throat: Oropharynx is clear and moist and mucous membranes are normal. No dental abscesses or edematous.  Eyes: Conjunctivae and EOM are normal. Pupils are equal, round, and reactive to light.  Neck: Normal range of motion and full passive range of motion without pain. Neck supple.  Cardiovascular: Normal rate, regular rhythm and normal heart sounds.  Exam reveals no gallop and no friction rub.   No murmur heard. Pulmonary/Chest: Effort normal and breath sounds normal. No respiratory distress. She has no wheezes. She has no rhonchi. She has no rales. She exhibits no tenderness and no crepitus.  Abdominal: Soft. Normal appearance and bowel sounds are normal. She exhibits no distension. There is tenderness in the right upper quadrant, epigastric area and left upper quadrant. There is no rebound and  no guarding.    Musculoskeletal: Normal range of motion. She exhibits no edema and no tenderness.  Moves all extremities well.   Neurological: She is alert and oriented to person, place, and time. She has normal strength. No cranial nerve deficit.  Skin: Skin is warm, dry and intact. No rash noted. No erythema. No pallor.  Psychiatric: She has a normal mood and affect. Her speech is normal and behavior is normal. Her mood appears not anxious.    ED Course  Procedures (including critical care time)  Medications  0.9 %  sodium chloride infusion (0 mLs Intravenous  Stopped 01/11/13 1921)    Followed by  0.9 %  sodium chloride infusion (1,000 mLs Intravenous New Bag/Given 01/11/13 1945)  LORazepam (ATIVAN) injection 0.5 mg (not administered)  morphine 4 MG/ML injection 4 mg (4 mg Intravenous Given 01/11/13 1831)  ondansetron (ZOFRAN) injection 4 mg (4 mg Intravenous Given 01/11/13 1831)  iohexol (OMNIPAQUE) 300 MG/ML solution 25 mL (25 mLs Oral Contrast Given 01/11/13 1907)  iohexol (OMNIPAQUE) 300 MG/ML solution 100 mL (100 mLs Intravenous Contrast Given 01/11/13 1907)  morphine 4 MG/ML injection 4 mg (4 mg Intravenous Given 01/11/13 1933)  levofloxacin (LEVAQUIN) IVPB 750 mg (0 mg Intravenous Stopped 01/11/13 2202)  sodium chloride 0.9 % bolus 1,000 mL (1,000 mLs Intravenous New Bag/Given 01/11/13 2204)   20:24 Dr Ewing Schlein, states Dr Madilyn Fireman had told him about pt coming to the ED, we discussed her labs and CT results, he states to have her go onto clear liquids and that Dr Madilyn Fireman would call her tomorrow. Agrees with stopping the zithromax.  Discussed results with patient. She will most likely be able to be discharged home, will evaluate her for hypoxia with her pneumonia, changed her antibiotic to levaquin.   21:30 nurse reports pt had a hyperventilation episode. Pt had told me she was seen in the ED recently for a hyperventilation episode. Pt on ativan at home.   Orthostatic VS noted, pt initially  declined to walk to get pulse ox, however when she did her pulse ox remained 100% on RA    Results for orders placed during the hospital encounter of 01/11/13  CBC WITH DIFFERENTIAL      Result Value Range   WBC 9.2  4.0 - 10.5 K/uL   RBC 4.91  3.87 - 5.11 MIL/uL   Hemoglobin 12.5  12.0 - 15.0 g/dL   HCT 47.8  29.5 - 62.1 %   MCV 76.6 (*) 78.0 - 100.0 fL   MCH 25.5 (*) 26.0 - 34.0 pg   MCHC 33.2  30.0 - 36.0 g/dL   RDW 30.8 (*) 65.7 - 84.6 %   Platelets 212  150 - 400 K/uL   Neutrophils Relative 71  43 - 77 %   Neutro Abs 6.5  1.7 - 7.7 K/uL   Lymphocytes Relative 23  12 - 46 %   Lymphs Abs 2.1  0.7 - 4.0 K/uL   Monocytes Relative 6  3 - 12 %   Monocytes Absolute 0.5  0.1 - 1.0 K/uL   Eosinophils Relative 0  0 - 5 %   Eosinophils Absolute 0.0  0.0 - 0.7 K/uL   Basophils Relative 0  0 - 1 %   Basophils Absolute 0.0  0.0 - 0.1 K/uL  COMPREHENSIVE METABOLIC PANEL      Result Value Range   Sodium 135  135 - 145 mEq/L   Potassium 3.4 (*) 3.5 - 5.1 mEq/L   Chloride 100  96 - 112 mEq/L   CO2 22  19 - 32 mEq/L   Glucose, Bld 103 (*) 70 - 99 mg/dL   BUN 10  6 - 23 mg/dL   Creatinine, Ser 9.62  0.50 - 1.10 mg/dL   Calcium 9.3  8.4 - 95.2 mg/dL   Total Protein 7.8  6.0 - 8.3 g/dL   Albumin 4.2  3.5 - 5.2 g/dL   AST 15  0 - 37 U/L   ALT 22  0 - 35 U/L   Alkaline Phosphatase 145 (*) 39 - 117 U/L   Total Bilirubin 0.3  0.3 - 1.2 mg/dL   GFR calc non Af Amer >90  >90 mL/min   GFR calc Af Amer >90  >90 mL/min  LIPASE, BLOOD      Result Value Range   Lipase 63 (*) 11 - 59 U/L  URINALYSIS, ROUTINE W REFLEX MICROSCOPIC      Result Value Range   Color, Urine YELLOW  YELLOW   APPearance CLEAR  CLEAR   Specific Gravity, Urine 1.015  1.005 - 1.030   pH 6.0  5.0 - 8.0   Glucose, UA NEGATIVE  NEGATIVE mg/dL   Hgb urine dipstick NEGATIVE  NEGATIVE   Bilirubin Urine NEGATIVE  NEGATIVE   Ketones, ur NEGATIVE  NEGATIVE mg/dL   Protein, ur NEGATIVE  NEGATIVE mg/dL   Urobilinogen, UA 0.2   0.0 - 1.0 mg/dL   Nitrite NEGATIVE  NEGATIVE   Leukocytes, UA NEGATIVE  NEGATIVE   Laboratory interpretation all normal except mild elevation of lipase, mild hypokalemia, and no leukocytosis or anemia  Ct Abdomen Pelvis W Contrast  01/11/2013  *RADIOLOGY REPORT*  Clinical Data: 22 year old female with abdominal and pelvic pain. History of cholecystectomy and elevated lipase.  CT ABDOMEN AND PELVIS WITH CONTRAST  Technique:  Multidetector CT imaging of the abdomen and pelvis was performed following the standard protocol during bolus administration of intravenous contrast.  Contrast:  100 ml intravenous Omnipaque-300  Comparison: 11/30/2012 MRCP  Findings: Patchy ground-glass opacities within the lung bases, right greater than left are noted and may represent inflammation or infection.  The liver, spleen, pancreas, adrenal glands and kidneys are unremarkable. The patient is status post cholecystectomy.  No free fluid, enlarged lymph nodes, biliary dilation or abdominal aortic aneurysm identified. The uterus and adnexal regions are within normal limits. The bowel, bladder and appendix are unremarkable.  No acute or suspicious bony abnormalities are identified.  IMPRESSION: Patchy ground-glass opacities within both lung bases - question infection/pneumonia or nonspecific inflammation.  No evidence of acute abnormality within the abdomen or pelvis.   Original Report Authenticated By: Harmon Pier, M.D.     Dg Chest 2 View  01/09/2013  *RADIOLOGY REPORT*  Clinical Data: Cough, shortness of breath, and midline chest pain.  CHEST - 2 VIEW  Comparison: None.  Findings: Heart size and pulmonary vascularity are normal and the lungs are clear.  No effusions.  No pneumothorax.  No osseous abnormality.  IMPRESSION: Normal exam.   Original Report Authenticated By: Francene Boyers, M.D.       1. Chronic abdominal pain   2. Healthcare-associated pneumonia   3. Nausea vomiting and diarrhea    New Prescriptions    DEXTROMETHORPHAN-GUAIFENESIN (MUCINEX DM) 30-600 MG TB12    1 po BID prn cough   HYDROCODONE-ACETAMINOPHEN (NORCO/VICODIN) 5-325 MG PER TABLET    Take 1 or 2 po Q 6hrs for pain   LEVOFLOXACIN (LEVAQUIN) 750 MG TABLET    Take 1 tablet (750 mg total) by mouth daily.   ONDANSETRON (ZOFRAN ODT) 8 MG DISINTEGRATING TABLET    Take 1 tablet (8 mg total) by mouth every 8 (eight) hours as needed for nausea.    Plan discharge  Devoria Albe, MD, FACEP    MDM          Ward Givens, MD 01/11/13 [id number]

## 2013-01-11 NOTE — Progress Notes (Signed)
Pt confirmed pcp is nathan conroy EPIC updated

## 2013-01-11 NOTE — ED Notes (Signed)
Patient went to her PCP today and was told to come to the ED for further evaluation and treatment. Patient was told to come to the ED for a CT scan. Patient has had a cholecystectomy, pancreatitis and intestinal blockage and was hospitalized for the same x 1 week. Patient  Continues to have pain in the epigastric area that radiates to her back. Patient has vomiting and diarrhea when she eats.

## 2013-01-17 ENCOUNTER — Encounter (HOSPITAL_COMMUNITY): Payer: Self-pay | Admitting: Internal Medicine

## 2013-01-17 ENCOUNTER — Inpatient Hospital Stay (HOSPITAL_COMMUNITY): Payer: BC Managed Care – PPO

## 2013-01-17 ENCOUNTER — Inpatient Hospital Stay (HOSPITAL_COMMUNITY)
Admission: EM | Admit: 2013-01-17 | Discharge: 2013-01-22 | DRG: 813 | Disposition: A | Discharge status: Home / Self Care | Payer: BC Managed Care – PPO | Attending: Internal Medicine | Admitting: Internal Medicine

## 2013-01-17 DIAGNOSIS — Z792 Long term (current) use of antibiotics: Secondary | ICD-10-CM

## 2013-01-17 DIAGNOSIS — Z8701 Personal history of pneumonia (recurrent): Secondary | ICD-10-CM

## 2013-01-17 DIAGNOSIS — K851 Biliary acute pancreatitis without necrosis or infection: Secondary | ICD-10-CM

## 2013-01-17 DIAGNOSIS — R112 Nausea with vomiting, unspecified: Secondary | ICD-10-CM | POA: Diagnosis present

## 2013-01-17 DIAGNOSIS — R109 Unspecified abdominal pain: Secondary | ICD-10-CM | POA: Diagnosis present

## 2013-01-17 DIAGNOSIS — K859 Acute pancreatitis without necrosis or infection, unspecified: Secondary | ICD-10-CM

## 2013-01-17 DIAGNOSIS — Z885 Allergy status to narcotic agent status: Secondary | ICD-10-CM

## 2013-01-17 DIAGNOSIS — R0602 Shortness of breath: Secondary | ICD-10-CM | POA: Diagnosis present

## 2013-01-17 DIAGNOSIS — R1013 Epigastric pain: Principal | ICD-10-CM | POA: Diagnosis present

## 2013-01-17 DIAGNOSIS — Z9089 Acquired absence of other organs: Secondary | ICD-10-CM

## 2013-01-17 DIAGNOSIS — Z79899 Other long term (current) drug therapy: Secondary | ICD-10-CM

## 2013-01-17 DIAGNOSIS — E876 Hypokalemia: Secondary | ICD-10-CM | POA: Diagnosis not present

## 2013-01-17 DIAGNOSIS — R197 Diarrhea, unspecified: Secondary | ICD-10-CM

## 2013-01-17 DIAGNOSIS — N831 Corpus luteum cyst of ovary, unspecified side: Secondary | ICD-10-CM | POA: Diagnosis present

## 2013-01-17 DIAGNOSIS — K5900 Constipation, unspecified: Secondary | ICD-10-CM

## 2013-01-17 HISTORY — DX: Anemia, unspecified: D64.9

## 2013-01-17 HISTORY — DX: Unspecified asthma, uncomplicated: J45.909

## 2013-01-17 LAB — HEPATIC FUNCTION PANEL
ALT: 27 U/L (ref 0–35)
AST: 29 U/L (ref 0–37)
Albumin: 4.3 g/dL (ref 3.5–5.2)
Alkaline Phosphatase: 146 U/L — ABNORMAL HIGH (ref 39–117)
Total Bilirubin: 0.3 mg/dL (ref 0.3–1.2)

## 2013-01-17 LAB — CBC WITH DIFFERENTIAL/PLATELET
Basophils Relative: 1 % (ref 0–1)
HCT: 39.8 % (ref 36.0–46.0)
Hemoglobin: 14 g/dL (ref 12.0–15.0)
Lymphs Abs: 2.9 10*3/uL (ref 0.7–4.0)
MCHC: 35.2 g/dL (ref 30.0–36.0)
Monocytes Absolute: 0.5 10*3/uL (ref 0.1–1.0)
Monocytes Relative: 7 % (ref 3–12)
Neutro Abs: 4 10*3/uL (ref 1.7–7.7)

## 2013-01-17 LAB — POCT I-STAT, CHEM 8
Chloride: 107 mEq/L (ref 96–112)
Creatinine, Ser: 0.6 mg/dL (ref 0.50–1.10)
Glucose, Bld: 86 mg/dL (ref 70–99)
HCT: 42 % (ref 36.0–46.0)
Potassium: 3.8 mEq/L (ref 3.5–5.1)

## 2013-01-17 LAB — URINALYSIS, ROUTINE W REFLEX MICROSCOPIC
Bilirubin Urine: NEGATIVE
Glucose, UA: NEGATIVE mg/dL
Ketones, ur: NEGATIVE mg/dL
pH: 8 (ref 5.0–8.0)

## 2013-01-17 MED ORDER — HYDROMORPHONE HCL PF 1 MG/ML IJ SOLN
1.0000 mg | Freq: Once | INTRAMUSCULAR | Status: AC
  Administered 2013-01-17: 1 mg via INTRAVENOUS
  Filled 2013-01-17: qty 1

## 2013-01-17 MED ORDER — MORPHINE SULFATE 4 MG/ML IJ SOLN
4.0000 mg | Freq: Once | INTRAMUSCULAR | Status: AC
  Administered 2013-01-17: 4 mg via INTRAVENOUS
  Filled 2013-01-17: qty 1

## 2013-01-17 MED ORDER — SODIUM CHLORIDE 0.9 % IV BOLUS (SEPSIS)
1000.0000 mL | Freq: Once | INTRAVENOUS | Status: AC
  Administered 2013-01-17: 1000 mL via INTRAVENOUS

## 2013-01-17 MED ORDER — ONDANSETRON HCL 4 MG PO TABS: 4.0000 mg | ORAL_TABLET | Freq: Four times a day (QID) | ORAL | Status: DC | PRN

## 2013-01-17 MED ORDER — ONDANSETRON HCL 4 MG/2ML IJ SOLN
4.0000 mg | Freq: Once | INTRAMUSCULAR | Status: AC
  Administered 2013-01-17: 4 mg via INTRAVENOUS
  Filled 2013-01-17: qty 2

## 2013-01-17 MED ORDER — ONDANSETRON HCL 4 MG/2ML IJ SOLN
4.0000 mg | Freq: Four times a day (QID) | INTRAMUSCULAR | Status: DC | PRN
  Administered 2013-01-18 – 2013-01-22 (×12): 4 mg via INTRAVENOUS
  Filled 2013-01-17 (×13): qty 2

## 2013-01-17 MED ORDER — ACETAMINOPHEN 325 MG PO TABS
650.0000 mg | ORAL_TABLET | Freq: Four times a day (QID) | ORAL | Status: DC | PRN
  Administered 2013-01-19: 650 mg via ORAL
  Filled 2013-01-17: qty 2

## 2013-01-17 MED ORDER — ACETAMINOPHEN 650 MG RE SUPP
650.0000 mg | Freq: Four times a day (QID) | RECTAL | Status: DC | PRN
  Administered 2013-01-18: 650 mg via RECTAL
  Filled 2013-01-17: qty 1

## 2013-01-17 MED ORDER — PANTOPRAZOLE SODIUM 40 MG IV SOLR
40.0000 mg | INTRAVENOUS | Status: DC
  Administered 2013-01-17 – 2013-01-21 (×5): 40 mg via INTRAVENOUS
  Filled 2013-01-17 (×6): qty 40

## 2013-01-17 MED ORDER — SODIUM CHLORIDE 0.9 % IV SOLN
INTRAVENOUS | Status: DC
  Administered 2013-01-17 – 2013-01-18 (×3): via INTRAVENOUS

## 2013-01-17 MED ORDER — HYDROMORPHONE HCL PF 1 MG/ML IJ SOLN
1.0000 mg | INTRAMUSCULAR | Status: DC | PRN
  Administered 2013-01-18: 1 mg via INTRAVENOUS
  Filled 2013-01-17: qty 1

## 2013-01-17 NOTE — ED Notes (Signed)
Pt aware we need to collect urine specimen. Has specimen cup.

## 2013-01-17 NOTE — ED Notes (Signed)
PA at bedside.

## 2013-01-17 NOTE — ED Provider Notes (Signed)
History     CSN: [id number]  Arrival date & time 01/17/13  1442   First MD Initiated Contact with Patient 01/17/13 1635      Chief Complaint  Patient presents with  . Abdominal Pain    (Consider location/radiation/quality/duration/timing/severity/associated sxs/prior treatment) HPI Comments: Pt is not breastfeeding. Recent cholecystectomy, recent pneumonia, chronic elevated lipases that primary care (P.A. Margaretha Glassing [phone]) concerned for chronic pancreatitis and would like her admitted.  Followed by GI Dr. Madilyn Fireman who does not do his own admissions. He admits through the hospitalist, and, as per primary care PA would like her admitted for observation to investigate chronic pancreatitis and possible second ERCP for any additional blockage s/p cholecystectomy.  Patient is a 22 y.o. female presenting with chronic abdominal pain for past month Severity: severe Onset: gradual Radiation: from epigastric area to mid-back Duration: since cholecystectomy in January Timing: Constant  Progression: worsening Relieved by: nothing Worsened by: Nothing tried  Ineffective treatments: None tried Associated sx: nausea, vomiting, diarrhea, recent weight loss, hx of elevated lipases      Patient is a 22 y.o. female presenting with abdominal pain.  Abdominal Pain Associated symptoms: diarrhea, nausea and vomiting   Associated symptoms: no chest pain, no constipation, no dysuria, no fever, no hematuria and no shortness of breath     Past Medical History  Diagnosis Date  . Constipation     treated with diet change  . Headache     migraines  . Hx of chlamydia infection   . Difficult intubation   . Pancreatitis     Past Surgical History  Procedure Laterality Date  . Wisdom tooth extraction    . Cesarean section  10/04/2012    Procedure: CESAREAN SECTION;  Surgeon: Bing Plume, MD;  Location: [facility];  Service: Obstetrics;  Laterality: N/A;  . Cholecystectomy  12/02/2012   Procedure: LAPAROSCOPIC CHOLECYSTECTOMY WITH INTRAOPERATIVE CHOLANGIOGRAM;  Surgeon: Romie Levee, MD;  Location: [facility];  Service: General;  Laterality: N/A;  . Ercp  12/04/2012    Procedure: ENDOSCOPIC RETROGRADE CHOLANGIOPANCREATOGRAPHY (ERCP);  Surgeon: Barrie Folk, MD;  Location: [facility];  Service: Endoscopy;  Laterality: N/A;  . Ercp  12/05/2012    Procedure: ENDOSCOPIC RETROGRADE CHOLANGIOPANCREATOGRAPHY (ERCP);  Surgeon: Barrie Folk, MD;  Location: [facility];  Service: Endoscopy;  Laterality: N/A;    Family History  Problem Relation Age of Onset  . Cerebral palsy Mother   . Fibroids Mother   . Diverticulitis Mother   . Migraines Mother   . Asthma Brother   . Cancer Maternal Grandmother     ovarian  . COPD Maternal Grandfather   . Diabetes Maternal Grandfather   . Heart disease Maternal Grandfather     heart valve  . Hypertension Maternal Grandfather   . Cancer Paternal Grandfather     lung    History  Substance Use Topics  . Smoking status: Never Smoker   . Smokeless tobacco: Never Used  . Alcohol Use: No    OB History   Grav Para Term Preterm Abortions TAB SAB Ect Mult Living   1 1 1       1       Review of Systems  Constitutional: Negative for fever and diaphoresis.  HENT: Negative for neck pain and neck stiffness.   Eyes: Negative for visual disturbance.  Respiratory: Negative for apnea, chest tightness and shortness of breath.   Cardiovascular: Negative for chest pain and palpitations.  Gastrointestinal: Positive for nausea, vomiting, abdominal pain and  diarrhea. Negative for constipation and blood in stool.       Diffuse epigastric pain radiating to mid-back  Genitourinary: Positive for decreased urine volume, difficulty urinating and pelvic pain. Negative for dysuria, frequency, hematuria and flank pain.  Musculoskeletal: Negative for gait problem.  Skin: Negative for rash.  Neurological: Negative for dizziness, weakness, light-headedness,  numbness and headaches.    Allergies  Fentanyl; Oxycodone; Peanut-containing drug products; and Red dye  Home Medications   Current Outpatient Rx  Name  Route  Sig  Dispense  Refill  . albuterol (PROVENTIL HFA;VENTOLIN HFA) 108 (90 BASE) MCG/ACT inhaler   Inhalation   Inhale 2 puffs into the lungs every 6 (six) hours as needed. For shortness of breath         . ALPRAZolam (XANAX) 0.5 MG tablet   Oral   Take 0.5 mg by mouth at bedtime as needed for sleep. Anxiety         . Dextromethorphan-Guaifenesin (MUCINEX DM) 30-600 MG TB12      1 po BID prn cough   28 each   0   . doxycycline (VIBRAMYCIN) 100 MG capsule   Oral   Take 100 mg by mouth 2 (two) times daily.         Marland Kitchen escitalopram (LEXAPRO) 10 MG tablet   Oral   Take 10 mg by mouth daily.         Marland Kitchen HYDROcodone-acetaminophen (NORCO/VICODIN) 5-325 MG per tablet   Oral   Take 1 tablet by mouth every 6 (six) hours as needed for pain. For pain           BP 106/76  Pulse 66  Temp(Src) 97.9 F (36.6 C) (Oral)  Resp 20  SpO2 100%  LMP 01/08/2013  Physical Exam  Nursing note and vitals reviewed. Constitutional: She is oriented to person, place, and time. She appears well-developed and well-nourished. No distress.  HENT:  Head: Normocephalic and atraumatic.  Eyes: Conjunctivae and EOM are normal.  Neck: Normal range of motion. Neck supple.  No meningeal signs  Cardiovascular: Normal rate, regular rhythm, normal heart sounds and intact distal pulses.  Exam reveals no gallop and no friction rub.   No murmur heard. Pulmonary/Chest: Effort normal and breath sounds normal. No respiratory distress. She has no wheezes. She has no rales. She exhibits no tenderness.  Abdominal: Soft. Bowel sounds are normal. She exhibits no distension. There is tenderness. There is no rebound and no guarding.  Diffuse epigastric pain. No pain at McBurney's point. No ecchymosis.   Musculoskeletal: Normal range of motion. She exhibits  no edema and no tenderness.  5/5 strength throughout.   Neurological: She is alert and oriented to person, place, and time. No cranial nerve deficit.  No focal deficits. Sensation to light touch intact.  Skin: Skin is warm and dry. She is not diaphoretic. No erythema.  Psychiatric:  anxious    ED Course  Procedures (including critical care time)  Labs Reviewed  LIPASE, BLOOD - Abnormal; Notable for the following:    Lipase 72 (*)    All other components within normal limits  CBC WITH DIFFERENTIAL - Abnormal; Notable for the following:    RBC 5.45 (*)    MCV 73.0 (*)    MCH 25.7 (*)    RDW 15.8 (*)    All other components within normal limits  POCT I-STAT, CHEM 8 - Abnormal; Notable for the following:    BUN <3 (*)    All other components within  normal limits  URINALYSIS, ROUTINE W REFLEX MICROSCOPIC  PREGNANCY, URINE  POCT PREGNANCY, URINE   No results found.   No diagnosis found.    MDM  Manage pain/nausea give IVF. Pt states morphine didn't help, will try dilaudid. Pt ambulated to the bathroom on her own. Dr. Bebe Shaggy talking to Dr. Madilyn Fireman office will likely admit for obs.   The patient appears reasonably stabilized for admission considering the current resources, flow, and capabilities available in the ED at this time, and I doubt any other [facility] requiring further screening and/or treatment in the ED prior to admission.Pt seen by Dr. Bebe Shaggy as well who is arranging for her admission.    Glade Nurse, PA-C 01/18/13 0023

## 2013-01-17 NOTE — ED Notes (Signed)
Pt reports 3 week hx of periumbilical and LUQ abdominal pain. States seen at [facility] for same. States low grade fever at home to 101. Reports n/v this AM and dizziness while standing.

## 2013-01-17 NOTE — H&P (Signed)
[facility] History and Physical  OLESYA WIKE WUJ:[id number] DOB: 18-Feb-1991 DOA: 01/17/2013  Referring physician: ER Physician. PCP: Arlyss Queen  Specialists: Dr.Hayes gastroenterologist.  Chief Complaint: Abdominal pain.  HPI: TASHEA OTHMAN is a 22 y.o. female has had gallstone pancreatitis and has undergone laparoscopic cholecystectomy in January 2 months ago and at that time had also undergone ERCP and sphincterotomy presents with complaints of persistent abdominal pain which has been gradually worsening. The pain is located mostly in the epigastric area with nausea vomiting and unable to keep anything in. Patient also has been having watery diarrhea for last few weeks since surgery. She had gone to the ER last week and has had CT abdomen and pelvis which was unremarkable except for possible pneumonia and patient was prescribed antibiotics which she has taken the last 5 days. At this time patient has come with persistent pain and gastroenterologist on call for Dr. Madilyn Fireman, Dr. Matthias Hughs was consulted by the ED physician and at this time they will be seeing patient in consult for possible EUS. Patient will be admitted for further management. Patient states in addition she's been having mild shortness of breath with exertion but at rest and on my exam patient is markedly short of breath and is not hypoxic. Denies any chest pain fever chills but does have weakness.  Review of Systems: As presented in the history of presenting illness nothing else significant.  Past Medical History  Diagnosis Date  . Constipation     treated with diet change  . Headache     migraines  . Hx of chlamydia infection   . Difficult intubation   . Pancreatitis    Past Surgical History  Procedure Laterality Date  . Wisdom tooth extraction    . Cesarean section  10/04/2012    Procedure: CESAREAN SECTION;  Surgeon: Bing Plume, MD;  Location: [facility];  Service: Obstetrics;  Laterality: N/A;  .  Cholecystectomy  12/02/2012    Procedure: LAPAROSCOPIC CHOLECYSTECTOMY WITH INTRAOPERATIVE CHOLANGIOGRAM;  Surgeon: Romie Levee, MD;  Location: [facility];  Service: General;  Laterality: N/A;  . Ercp  12/04/2012    Procedure: ENDOSCOPIC RETROGRADE CHOLANGIOPANCREATOGRAPHY (ERCP);  Surgeon: Barrie Folk, MD;  Location: [facility];  Service: Endoscopy;  Laterality: N/A;  . Ercp  12/05/2012    Procedure: ENDOSCOPIC RETROGRADE CHOLANGIOPANCREATOGRAPHY (ERCP);  Surgeon: Barrie Folk, MD;  Location: [facility];  Service: Endoscopy;  Laterality: N/A;   Social History:  reports that she has never smoked. She has never used smokeless tobacco. She reports that she does not drink alcohol or use illicit drugs. Lives at home. where does patient live--home, ALF, SNF? and with whom if at home? Can do ADLs. Can patient participate in ADLs?  Allergies  Allergen Reactions  . Fentanyl Itching, Rash and Other (See Comments)    "flushed" feeling  . Oxycodone     Rash and Hives  . Peanut-Containing Drug Products Hives  . Red Dye Nausea And Vomiting    Red food coloring    Family History  Problem Relation Age of Onset  . Cerebral palsy Mother   . Fibroids Mother   . Diverticulitis Mother   . Migraines Mother   . Asthma Brother   . Cancer Maternal Grandmother     ovarian  . COPD Maternal Grandfather   . Diabetes Maternal Grandfather   . Heart disease Maternal Grandfather     heart valve  . Hypertension Maternal Grandfather   . Cancer Paternal Grandfather  lung      Prior to Admission medications   Medication Sig Start Date End Date Taking? Authorizing Deanza Upperman  albuterol (PROVENTIL HFA;VENTOLIN HFA) 108 (90 BASE) MCG/ACT inhaler Inhale 2 puffs into the lungs every 6 (six) hours as needed. For shortness of breath   Yes Historical Miangel Flom, MD  ALPRAZolam Prudy Feeler) 0.5 MG tablet Take 0.5 mg by mouth at bedtime as needed for sleep. Anxiety   Yes Historical Marda Breidenbach, MD  Dextromethorphan-Guaifenesin  (MUCINEX DM) 30-600 MG TB12 1 po BID prn cough 01/11/13  Yes Ward Givens, MD  doxycycline (VIBRAMYCIN) 100 MG capsule Take 100 mg by mouth 2 (two) times daily.   Yes Historical Catheryne Deford, MD  escitalopram (LEXAPRO) 10 MG tablet Take 10 mg by mouth daily.   Yes Historical Oluwatosin Higginson, MD  HYDROcodone-acetaminophen (NORCO/VICODIN) 5-325 MG per tablet Take 1 tablet by mouth every 6 (six) hours as needed for pain. For pain 01/11/13  Yes Ward Givens, MD   Physical Exam: Filed Vitals:   01/17/13 1730 01/17/13 1800 01/17/13 2000 01/17/13 2100  BP: 104/62 108/82 116/74 104/77  Pulse: 60 56 58 71  Temp:      TempSrc:      Resp:      SpO2: 100% 100% 100% 98%     General:  Well-developed and nourished.  Eyes: Anicteric no pallor.  ENT: No discharge from the ears eyes nose and mouth.  Neck: No mass felt.  Cardiovascular: S1-S2 heard.  Respiratory: No rhonchi no crepitations.  Abdomen: Soft mild tenderness in the epigastric area.  Skin: No rash.  Musculoskeletal: No edema. No effusion in the joints.  Psychiatric: Appears normal.  Neurologic: Moves all extremities.  Labs on Admission:  Basic Metabolic Panel:  Recent Labs Lab 01/11/13 1801 01/17/13 1544  NA 135 140  K 3.4* 3.8  CL 100 107  CO2 22  --   GLUCOSE 103* 86  BUN 10 <3*  CREATININE 0.53 0.60  CALCIUM 9.3  --    Liver Function Tests:  Recent Labs Lab 01/11/13 1801 01/17/13 1821  AST 15 29  ALT 22 27  ALKPHOS 145* 146*  BILITOT 0.3 0.3  PROT 7.8 8.0  ALBUMIN 4.2 4.3    Recent Labs Lab 01/11/13 1801 01/17/13 1500  LIPASE 63* 72*   No results found for this basename: AMMONIA,  in the last 168 hours CBC:  Recent Labs Lab 01/11/13 1801 01/17/13 1544 01/17/13 1640  WBC 9.2  --  7.5  NEUTROABS 6.5  --  4.0  HGB 12.5 14.3 14.0  HCT 37.6 42.0 39.8  MCV 76.6*  --  73.0*  PLT 212  --  234   Cardiac Enzymes: No results found for this basename: CKTOTAL, CKMB, CKMBINDEX, TROPONINI,  in the last 168  hours  BNP (last 3 results) No results found for this basename: PROBNP,  in the last 8760 hours CBG: No results found for this basename: GLUCAP,  in the last 168 hours  Radiological Exams on Admission: No results found.   Assessment/Plan Active Problems:   Abdominal pain   Nausea vomiting and diarrhea   SOB (shortness of breath)   1. Abdominal pain with nausea vomiting and diarrhea in a patient who has had recent laparoscopic cholecystectomy for gallstone pancreatitis with ERCP and sphincterotomy - labs reveal only mildly elevated alkaline phosphatase. Patient will be kept in view in anticipation of possible procedure in a.m. by gastroenterologist. Continue with pain relief medications. Hydration. 2. Shortness of breath - patient was just  recently treated for possible pneumonia. On exam patient is not in distress and there is no wheezing or crepitations. I have ordered a chest x-ray which has to be followed.  Dr. Matthias Hughs was consulted. if consultant consulted, please document name and whether formally or informally consulted  Code Status: Full code.  Family Communication: Discussed with patient's husband at the bedside.  Disposition Plan: Admit to inpatient.  KAKRAKANDY,ARSHAD N. [facility] Pager [phone].  If 7PM-7AM, please contact night-coverage [email or web address] Password [facility] 01/17/2013, 9:08 PM

## 2013-01-17 NOTE — ED Provider Notes (Signed)
i spoke to dr buccini who feels pt should be admitted i spoke to dr Toniann Fail he recommends full inpatient admission Pt stable in ED  Joya Gaskins, MD 01/17/13 1944

## 2013-01-18 ENCOUNTER — Inpatient Hospital Stay (HOSPITAL_COMMUNITY): Payer: BC Managed Care – PPO

## 2013-01-18 DIAGNOSIS — K5900 Constipation, unspecified: Secondary | ICD-10-CM

## 2013-01-18 LAB — CBC
HCT: 35.9 % — ABNORMAL LOW (ref 36.0–46.0)
Hemoglobin: 12.1 g/dL (ref 12.0–15.0)
MCHC: 33.7 g/dL (ref 30.0–36.0)
MCV: 76.1 fL — ABNORMAL LOW (ref 78.0–100.0)
RDW: 15.9 % — ABNORMAL HIGH (ref 11.5–15.5)
WBC: 9.1 10*3/uL (ref 4.0–10.5)

## 2013-01-18 LAB — HEPATIC FUNCTION PANEL
ALT: 21 U/L (ref 0–35)
Albumin: 3.5 g/dL (ref 3.5–5.2)
Alkaline Phosphatase: 124 U/L — ABNORMAL HIGH (ref 39–117)
Total Protein: 6.3 g/dL (ref 6.0–8.3)

## 2013-01-18 LAB — BASIC METABOLIC PANEL
BUN: 5 mg/dL — ABNORMAL LOW (ref 6–23)
CO2: 24 mEq/L (ref 19–32)
Chloride: 107 mEq/L (ref 96–112)
Creatinine, Ser: 0.72 mg/dL (ref 0.50–1.10)
GFR calc Af Amer: >90 mL/min (ref >90)
Potassium: 4.1 mEq/L (ref 3.5–5.1)

## 2013-01-18 LAB — GLUCOSE, CAPILLARY
Glucose-Capillary: 124 mg/dL — ABNORMAL HIGH (ref 70–99)
Glucose-Capillary: 65 mg/dL — ABNORMAL LOW (ref 70–99)
Glucose-Capillary: 77 mg/dL (ref 70–99)
Glucose-Capillary: 83 mg/dL (ref 70–99)
Glucose-Capillary: 90 mg/dL (ref 70–99)

## 2013-01-18 MED ORDER — DEXTROSE-NACL 5-0.9 % IV SOLN
INTRAVENOUS | Status: DC
  Administered 2013-01-19 – 2013-01-22 (×7): via INTRAVENOUS

## 2013-01-18 MED ORDER — PROMETHAZINE HCL 25 MG/ML IJ SOLN
25.0000 mg | INTRAMUSCULAR | Status: DC | PRN
  Administered 2013-01-18 – 2013-01-20 (×5): 25 mg via INTRAVENOUS
  Filled 2013-01-18 (×4): qty 1

## 2013-01-18 MED ORDER — TECHNETIUM TC 99M MEBROFENIN IV KIT
5.0000 | PACK | Freq: Once | INTRAVENOUS | Status: AC | PRN
  Administered 2013-01-18: 5 via INTRAVENOUS

## 2013-01-18 MED ORDER — PROMETHAZINE HCL 25 MG/ML IJ SOLN
12.5000 mg | Freq: Once | INTRAMUSCULAR | Status: AC
  Administered 2013-01-18: 12.5 mg via INTRAVENOUS
  Filled 2013-01-18: qty 1

## 2013-01-18 MED ORDER — HYDROMORPHONE HCL PF 1 MG/ML IJ SOLN
0.5000 mg | INTRAMUSCULAR | Status: DC | PRN
  Administered 2013-01-18 – 2013-01-19 (×5): 0.5 mg via INTRAVENOUS
  Filled 2013-01-18 (×6): qty 1

## 2013-01-18 MED ORDER — DEXTROSE 50 % IV SOLN
INTRAVENOUS | Status: AC
  Administered 2013-01-18: 25 mL
  Filled 2013-01-18: qty 50

## 2013-01-18 MED ORDER — DEXTROSE 50 % IV SOLN: 25.0000 mL | Freq: Once | INTRAVENOUS | Status: AC | PRN

## 2013-01-18 NOTE — Progress Notes (Signed)
[facility] PROGRESS NOTE  TANETTE CHAUCA EAV:[id number] DOB: 01-05-1991 DOA: 01/17/2013 PCP: Arlyss Queen   Brief History Valerie Cortez is a 22 y.o. female who had gallstone pancreatitis and underwent laparoscopic cholecystectomy in December 02, 2012.  At that time also had ERCP and sphincterotomy.  Presents with complaints of persistent abdominal pain which has been gradually worsening. The pain is located mostly in the epigastric area with nausea vomiting and unable to keep anything down. Patient also has been having watery diarrhea for last few weeks since surgery. She had gone to the ER last week and has had CT abdomen and pelvis which was unremarkable except for possible pneumonia and patient was prescribed antibiotics (levaquin) which she has taken the last 5 days. At this time patient has come with persistent pain and gastroenterologist on call for Dr. Madilyn Fireman, Dr. Matthias Hughs was consulted.   Assessment/Plan: Nausea / Vomiting / Abdominal Pain Patient vomiting for 3 hours straight overnight.  Too nauseated for ice chips. [facility] on board.  Appreciate their consultation. Hyda scan ordered to rule out bile leak. We will follow GI's lead and treat symptoms supportively with phenergan, zofran and ice chips.  Diarrhea Seems resolved. Patient has had solid stool. No further episodes of diarrhea at this point. Stool cultures pending  Recent Pneumonia Took Levaquin OP for 5 days. Chest xray negative. Patient not symptomatic.   Code Status: Full Family Communication: Mother and husband at bedside Disposition Plan: inpatient.   Consultants:  [name]  Procedures:    Antibiotics:    HPI/Subjective: Patient complaining of nausea.  Abdominal pain is epigastric, radiates right and thru to her back.  Objective: Filed Vitals:   01/17/13 2000 01/17/13 2100 01/18/13 0456 01/18/13 0504  BP: 116/74 104/77 102/59   Pulse: 58 71 69   Temp:   97.8 F (36.6 C)   TempSrc:   Oral    Resp:   18   Height:  5\' 2"  (1.575 m)    Weight:  64.1 kg (141 lb 5 oz)  64.3 kg (141 lb 12.1 oz)  SpO2: 100% 98% 100%     Intake/Output Summary (Last 24 hours) at 01/18/13 1308 Last data filed at 01/18/13 0538  Gross per 24 hour  Intake 658.33 ml  Output      0 ml  Net 658.33 ml   Filed Weights   01/17/13 2100 01/18/13 0504  Weight: 64.1 kg (141 lb 5 oz) 64.3 kg (141 lb 12.1 oz)    Exam:   General:  Wd, Wn, female, lying in bed.  Family at bedside  Cardiovascular: rrr, no m/r/g, no lle  Respiratory: no w/c/r, no accessory muscle use  Abdomen: soft, min tender in epigastrum.  I don't hear bowel sounds.  Not distended, no masses  Musculoskeletal: 5/5 strength in all 4 extremities.  Data Reviewed: Basic Metabolic Panel:  Recent Labs Lab 01/11/13 1801 01/17/13 1544 01/18/13 0620  NA 135 140 141  K 3.4* 3.8 4.1  CL 100 107 107  CO2 22  --  24  GLUCOSE 103* 86 87  BUN 10 <3* 5*  CREATININE 0.53 0.60 0.72  CALCIUM 9.3  --  9.2   Liver Function Tests:  Recent Labs Lab 01/11/13 1801 01/17/13 1821 01/18/13 0620  AST 15 29 17   ALT 22 27 21   ALKPHOS 145* 146* 124*  BILITOT 0.3 0.3 0.3  PROT 7.8 8.0 6.3  ALBUMIN 4.2 4.3 3.5    Recent Labs Lab 01/11/13 1801 01/17/13 1500  01/18/13 0620  LIPASE 63* 72* 53   CBC:  Recent Labs Lab 01/11/13 1801 01/17/13 1544 01/17/13 1640 01/18/13 0620  WBC 9.2  --  7.5 9.1  NEUTROABS 6.5  --  4.0  --   HGB 12.5 14.3 14.0 12.1  HCT 37.6 42.0 39.8 35.9*  MCV 76.6*  --  73.0* 76.1*  PLT 212  --  234 201   CBG:  Recent Labs Lab 01/18/13 0032 01/18/13 0624 01/18/13 0812 01/18/13 1224  GLUCAP 77 90 83 76     Studies: Dg Chest 2 View  01/17/2013  *RADIOLOGY REPORT*  Clinical Data: Cough, concern for pneumonia  CHEST - 2 VIEW  Comparison: None.  Findings: Normal mediastinum and cardiac silhouette.  Normal pulmonary  vasculature.  No evidence of effusion, infiltrate, or pneumothorax.  No acute bony  abnormality. The the ground-glass opacities in the right lower lobe on comparison CT are not evident and may be occult by plain film radiography.  IMPRESSION: No acute cardiopulmonary process.   Original Report Authenticated By: Genevive Bi, M.D.     Scheduled Meds: . pantoprazole (PROTONIX) IV  40 mg Intravenous Q24H   Continuous Infusions: . sodium chloride 100 mL/hr at 01/18/13 0903    Active Problems:   Abdominal pain   Nausea vomiting and diarrhea   SOB (shortness of breath)   Conley Canal [phone]  [facility] If 7PM-7AM, please contact night-coverage at [email or web address], password [facility] 01/18/2013, 1:08 PM  LOS: 1 day

## 2013-01-18 NOTE — Consult Note (Addendum)
[facility] Consult Note  Referring Provider: No ref. provider found Primary Care Physician:  Arlyss Queen Primary Gastroenterologist:  Dr.  Antony Contras Complaint: Abdominal pain, nausea and vomiting HPI: Valerie Cortez is an 22 y.o. white female  who underwent cholecystectomy on January 17 with stones seen on ultrasound. There was no drainage into the intestine on intraoperative cholangiogram she underwent ERCP 2 days later which did not clearly shows any stones, a sphincterotomy was performed and multiple balloon sweeps were made. She had an MRI, MRCP prior to her surgery they did not reveal the common bile duct stones.  She was discharged the day after the ERCP with some modest abdominal pain with fall in liver function tests. Since then she has had intermittent degrees of fairly persistent epigastric pain nausea and vomiting. She's been evaluated on multiple occasions as an outpatient and at the emergency room. On one occasion she had an elevated lipase of 700 with or upper limits of normal at that laboratory 400. She was seen in my office a week ago and a Center to the emergency room where she had an abdominal CT scan which was unremarkable. I had her come back for labs yesterday including lipase and liver function tests and they were normal. However she would see her primary care physician and he noted continued epigastric abdominal pain nausea and vomiting and she was admitted for further workup.  Past Medical History  Diagnosis Date  . Constipation     treated with diet change  . Headache     migraines  . Hx of chlamydia infection   . Difficult intubation   . Pancreatitis     Past Surgical History  Procedure Laterality Date  . Wisdom tooth extraction    . Cesarean section  10/04/2012    Procedure: CESAREAN SECTION;  Surgeon: Bing Plume, MD;  Location: [facility];  Service: Obstetrics;  Laterality: N/A;  . Cholecystectomy  12/02/2012    Procedure: LAPAROSCOPIC  CHOLECYSTECTOMY WITH INTRAOPERATIVE CHOLANGIOGRAM;  Surgeon: Romie Levee, MD;  Location: [facility];  Service: General;  Laterality: N/A;  . Ercp  12/04/2012    Procedure: ENDOSCOPIC RETROGRADE CHOLANGIOPANCREATOGRAPHY (ERCP);  Surgeon: Barrie Folk, MD;  Location: [facility];  Service: Endoscopy;  Laterality: N/A;  . Ercp  12/05/2012    Procedure: ENDOSCOPIC RETROGRADE CHOLANGIOPANCREATOGRAPHY (ERCP);  Surgeon: Barrie Folk, MD;  Location: [facility];  Service: Endoscopy;  Laterality: N/A;    Medications Prior to Admission  Medication Sig Dispense Refill  . albuterol (PROVENTIL HFA;VENTOLIN HFA) 108 (90 BASE) MCG/ACT inhaler Inhale 2 puffs into the lungs every 6 (six) hours as needed. For shortness of breath      . ALPRAZolam (XANAX) 0.5 MG tablet Take 0.5 mg by mouth at bedtime as needed for sleep. Anxiety      . Dextromethorphan-Guaifenesin (MUCINEX DM) 30-600 MG TB12 1 po BID prn cough  28 each  0  . doxycycline (VIBRAMYCIN) 100 MG capsule Take 100 mg by mouth 2 (two) times daily.      Marland Kitchen escitalopram (LEXAPRO) 10 MG tablet Take 10 mg by mouth daily.      Marland Kitchen HYDROcodone-acetaminophen (NORCO/VICODIN) 5-325 MG per tablet Take 1 tablet by mouth every 6 (six) hours as needed for pain. For pain        Allergies:  Allergies  Allergen Reactions  . Fentanyl Itching, Rash and Other (See Comments)    "flushed" feeling  . Oxycodone     Rash and Hives  . Peanut-Containing Drug  Products Hives  . Red Dye Nausea And Vomiting    Red food coloring    Family History  Problem Relation Age of Onset  . Cerebral palsy Mother   . Fibroids Mother   . Diverticulitis Mother   . Migraines Mother   . Asthma Brother   . Cancer Maternal Grandmother     ovarian  . COPD Maternal Grandfather   . Diabetes Maternal Grandfather   . Heart disease Maternal Grandfather     heart valve  . Hypertension Maternal Grandfather   . Cancer Paternal Grandfather     lung    Social History:  reports that she has  never smoked. She has never used smokeless tobacco. She reports that she does not drink alcohol or use illicit drugs.  Review of Systems: negative except as above   Blood pressure 102/59, pulse 69, temperature 97.8 F (36.6 C), temperature source Oral, resp. rate 18, height 5\' 2"  (1.575 m), weight 64.3 kg (141 lb 12.1 oz), last menstrual period 01/08/2013, SpO2 100.00%. Head: Normocephalic, without obvious abnormality, atraumatic Neck: no adenopathy, no carotid bruit, no JVD, supple, symmetrical, trachea midline and thyroid not enlarged, symmetric, no tenderness/mass/nodules Resp: clear to auscultation bilaterally Cardio: regular rate and rhythm, S1, S2 normal, no murmur, click, rub or gallop GI: Abdomen soft with moderate epigastric tenderness Extremities: extremities normal, atraumatic, no cyanosis or edema  Results for orders placed during the hospital encounter of 01/17/13 (from the past 48 hour(s))  LIPASE, BLOOD     Status: Abnormal   Collection Time    01/17/13  3:00 PM      Result Value Range   Lipase 72 (*) 11 - 59 U/L  URINALYSIS, ROUTINE W REFLEX MICROSCOPIC     Status: None   Collection Time    01/17/13  3:13 PM      Result Value Range   Color, Urine YELLOW  YELLOW   APPearance CLEAR  CLEAR   Specific Gravity, Urine 1.006  1.005 - 1.030   pH 8.0  5.0 - 8.0   Glucose, UA NEGATIVE  NEGATIVE mg/dL   Hgb urine dipstick NEGATIVE  NEGATIVE   Bilirubin Urine NEGATIVE  NEGATIVE   Ketones, ur NEGATIVE  NEGATIVE mg/dL   Protein, ur NEGATIVE  NEGATIVE mg/dL   Urobilinogen, UA 0.2  0.0 - 1.0 mg/dL   Nitrite NEGATIVE  NEGATIVE   Leukocytes, UA NEGATIVE  NEGATIVE   Comment: MICROSCOPIC NOT DONE ON URINES WITH NEGATIVE PROTEIN, BLOOD, LEUKOCYTES, NITRITE, OR GLUCOSE <1000 mg/dL.  PREGNANCY, URINE     Status: None   Collection Time    01/17/13  3:13 PM      Result Value Range   Preg Test, Ur NEGATIVE  NEGATIVE   Comment:            THE SENSITIVITY OF THIS     METHODOLOGY IS  >20 mIU/mL.  POCT PREGNANCY, URINE     Status: None   Collection Time    01/17/13  3:19 PM      Result Value Range   Preg Test, Ur NEGATIVE  NEGATIVE   Comment:            THE SENSITIVITY OF THIS     METHODOLOGY IS >24 mIU/mL  POCT I-STAT, CHEM 8     Status: Abnormal   Collection Time    01/17/13  3:44 PM      Result Value Range   Sodium 140  135 - 145 mEq/L   Potassium 3.8  3.5 - 5.1 mEq/L   Chloride 107  96 - 112 mEq/L   BUN <3 (*) 6 - 23 mg/dL   Creatinine, Ser 1.61  0.50 - 1.10 mg/dL   Glucose, Bld 86  70 - 99 mg/dL   Calcium, Ion 0.96  0.45 - 1.23 mmol/L   TCO2 24  0 - 100 mmol/L   Hemoglobin 14.3  12.0 - 15.0 g/dL   HCT 40.9  81.1 - 91.4 %  CBC WITH DIFFERENTIAL     Status: Abnormal   Collection Time    01/17/13  4:40 PM      Result Value Range   WBC 7.5  4.0 - 10.5 K/uL   RBC 5.45 (*) 3.87 - 5.11 MIL/uL   Hemoglobin 14.0  12.0 - 15.0 g/dL   HCT 78.2  95.6 - 21.3 %   MCV 73.0 (*) 78.0 - 100.0 fL   MCH 25.7 (*) 26.0 - 34.0 pg   MCHC 35.2  30.0 - 36.0 g/dL   RDW 08.6 (*) 57.8 - 46.9 %   Platelets 234  150 - 400 K/uL   Neutrophils Relative 53  43 - 77 %   Neutro Abs 4.0  1.7 - 7.7 K/uL   Lymphocytes Relative 38  12 - 46 %   Lymphs Abs 2.9  0.7 - 4.0 K/uL   Monocytes Relative 7  3 - 12 %   Monocytes Absolute 0.5  0.1 - 1.0 K/uL   Eosinophils Relative 2  0 - 5 %   Eosinophils Absolute 0.1  0.0 - 0.7 K/uL   Basophils Relative 1  0 - 1 %   Basophils Absolute 0.1  0.0 - 0.1 K/uL  HEPATIC FUNCTION PANEL     Status: Abnormal   Collection Time    01/17/13  6:21 PM      Result Value Range   Total Protein 8.0  6.0 - 8.3 g/dL   Albumin 4.3  3.5 - 5.2 g/dL   AST 29  0 - 37 U/L   ALT 27  0 - 35 U/L   Alkaline Phosphatase 146 (*) 39 - 117 U/L   Total Bilirubin 0.3  0.3 - 1.2 mg/dL   Bilirubin, Direct <6.2  0.0 - 0.3 mg/dL   Indirect Bilirubin NOT CALCULATED  0.3 - 0.9 mg/dL  GLUCOSE, CAPILLARY     Status: None   Collection Time    01/18/13 12:32 AM      Result Value  Range   Glucose-Capillary 77  70 - 99 mg/dL  HEPATIC FUNCTION PANEL     Status: Abnormal   Collection Time    01/18/13  6:20 AM      Result Value Range   Total Protein 6.3  6.0 - 8.3 g/dL   Albumin 3.5  3.5 - 5.2 g/dL   AST 17  0 - 37 U/L   ALT 21  0 - 35 U/L   Alkaline Phosphatase 124 (*) 39 - 117 U/L   Total Bilirubin 0.3  0.3 - 1.2 mg/dL   Bilirubin, Direct <9.5  0.0 - 0.3 mg/dL   Indirect Bilirubin NOT CALCULATED  0.3 - 0.9 mg/dL  LIPASE, BLOOD     Status: None   Collection Time    01/18/13  6:20 AM      Result Value Range   Lipase 53  11 - 59 U/L  BASIC METABOLIC PANEL     Status: Abnormal   Collection Time    01/18/13  6:20 AM  Result Value Range   Sodium 141  135 - 145 mEq/L   Potassium 4.1  3.5 - 5.1 mEq/L   Chloride 107  96 - 112 mEq/L   CO2 24  19 - 32 mEq/L   Glucose, Bld 87  70 - 99 mg/dL   BUN 5 (*) 6 - 23 mg/dL   Creatinine, Ser 4.09  0.50 - 1.10 mg/dL   Calcium 9.2  8.4 - 81.1 mg/dL   GFR calc non Af Amer >90  >90 mL/min   GFR calc Af Amer >90  >90 mL/min   Comment:            The eGFR has been calculated     using the CKD EPI equation.     This calculation has not been     validated in all clinical     situations.     eGFR's persistently     <90 mL/min signify     possible Chronic Kidney Disease.  CBC     Status: Abnormal   Collection Time    01/18/13  6:20 AM      Result Value Range   WBC 9.1  4.0 - 10.5 K/uL   RBC 4.72  3.87 - 5.11 MIL/uL   Hemoglobin 12.1  12.0 - 15.0 g/dL   HCT 91.4 (*) 78.2 - 95.6 %   MCV 76.1 (*) 78.0 - 100.0 fL   MCH 25.6 (*) 26.0 - 34.0 pg   MCHC 33.7  30.0 - 36.0 g/dL   RDW 21.3 (*) 08.6 - 57.8 %   Platelets 201  150 - 400 K/uL  GLUCOSE, CAPILLARY     Status: None   Collection Time    01/18/13  6:24 AM      Result Value Range   Glucose-Capillary 90  70 - 99 mg/dL  GLUCOSE, CAPILLARY     Status: None   Collection Time    01/18/13  8:12 AM      Result Value Range   Glucose-Capillary 83  70 - 99 mg/dL   Dg  Chest 2 View  02/19/9628  *RADIOLOGY REPORT*  Clinical Data: Cough, concern for pneumonia  CHEST - 2 VIEW  Comparison: None.  Findings: Normal mediastinum and cardiac silhouette.  Normal pulmonary  vasculature.  No evidence of effusion, infiltrate, or pneumothorax.  No acute bony abnormality. The the ground-glass opacities in the right lower lobe on comparison CT are not evident and may be occult by plain film radiography.  IMPRESSION: No acute cardiopulmonary process.   Original Report Authenticated By: Genevive Bi, M.D.     Assessment: Persistent abdominal pain after initial presentation with gallstone pancreatitis, subsequent cholecystectomy and ERCP with sphincterotomy and balloon sweep. Etiology still not entirely clearregarding her ongoing pain and vomiting Plan:  Will obtain HIDA scan to see if bile drains readily into the intestine and to more conclusively rule out a bile leak from surgery. If this is normal or suggests poor biliary drainage will try to set up endoscopic ultrasound with possible ERCP. If bile leak discovered she will need ERCP with stent. HAYES,Biran Mayberry C 01/18/2013, 9:51 AM

## 2013-01-18 NOTE — Care Management Note (Signed)
    Page 1 of 1   01/22/2013     4:20:33 PM   CARE MANAGEMENT NOTE 01/22/2013  Patient:  Valerie Cortez, Valerie Cortez   Account Number:  [id number]  Date Initiated:  01/18/2013  Documentation initiated by:  Letha Cape  Subjective/Objective Assessment:   dx abd pain, n/v/d  admit- lives with spouse.     Action/Plan:   Anticipated DC Date:  01/22/2013   Anticipated DC Plan:  HOME/SELF CARE      DC Planning Services  CM consult      Choice offered to / List presented to:             Status of service:  Completed, signed off Medicare Important Message given?   (If response is "NO", the following Medicare IM given date fields will be blank) Date Medicare IM given:   Date Additional Medicare IM given:    Discharge Disposition:  HOME/SELF CARE  Per UR Regulation:  Reviewed for med. necessity/level of care/duration of stay  If discussed at Long Length of Stay Meetings, dates discussed:    Comments:  01/22/13 16:19 Letha Cape RN, BSN [phone] patient dc to home , no needs anticipated, patient will have a endoscopic u/s of pancreas at [place] on Wed.  01/18/13 16:57 Letha Cape RN, BSN [phone] patient for ERCP, NPO, GI to see patient.  NCM will continue to follow for dc needs.

## 2013-01-18 NOTE — Progress Notes (Signed)
Hypoglycemic Event  CBG: 65  Treatment: D50 IV 25 mL  Symptoms: None  Follow-up CBG: Time:2004 CBG Result:124  Possible Reasons for Event: Vomiting and Inadequate meal intake  Comments/MD notified:md [place] notified. Pt's IV fluids changed    Valerie Cortez S  Remember to initiate Hypoglycemia Order Set & complete

## 2013-01-18 NOTE — Progress Notes (Signed)
INITIAL NUTRITION ASSESSMENT  DOCUMENTATION CODES Per approved criteria  -Not Applicable   INTERVENTION: 1. Follow diet advance for supplement needs  NUTRITION DIAGNOSIS: Inadequate oral intake related to poor appetite, N/V as evidenced by 8 lb weight loss in one month per pt report.   Goal: Diet advance to meet >/=90% estimated nutrition needs  Monitor:  PO intake/diet, weight trends, I/O's, labs  Reason for Assessment: Malnutrition Screening Tool  22 y.o. female  Admitting Dx: Abd pain  ASSESSMENT: Pt with recent lap chole, ERCP, sphincterotomy, admitted with abd pain, N/V/D.   Pt with recent weight loss, some related to having a baby about 3 months ago. Pt reports pre-pregnancy weight to be about 149 lbs. Pt states she has lost about 8 lbs in the past month related to poor oral intake, N/V.   Height: Ht Readings from Last 1 Encounters:  01/17/13 5\' 2"  (1.575 m)    Weight: Wt Readings from Last 1 Encounters:  01/18/13 141 lb 12.1 oz (64.3 kg)    Ideal Body Weight: 110 lbs   % Ideal Body Weight: 128%  Wt Readings from Last 10 Encounters:  01/18/13 141 lb 12.1 oz (64.3 kg)  01/09/13 139 lb 8 oz (63.277 kg)  11/30/12 147 lb 4.3 oz (66.8 kg)  11/30/12 147 lb 4.3 oz (66.8 kg)  11/30/12 147 lb 4.3 oz (66.8 kg)  11/30/12 147 lb 4.3 oz (66.8 kg)  10/03/12 177 lb (80.287 kg)  10/03/12 177 lb (80.287 kg)  09/24/12 178 lb (80.74 kg)  09/16/12 178 lb (80.74 kg)    Usual Body Weight: 149 lbs (prior to pregnancy)  % Usual Body Weight: 95%  BMI:  Body mass index is 25.92 kg/(m^2). Overweight   Estimated Nutritional Needs: Kcal: 1800-2000 Protein: 60-70 gm  Fluid: >/= 2 L  Skin: intact   Diet Order: NPO  EDUCATION NEEDS: -No education needs identified at this time   Intake/Output Summary (Last 24 hours) at 01/18/13 0928 Last data filed at 01/18/13 0538  Gross per 24 hour  Intake 658.33 ml  Output      0 ml  Net 658.33 ml    Last BM: PTA     Labs:   Recent Labs Lab 01/11/13 1801 01/17/13 1544 01/18/13 0620  NA 135 140 141  K 3.4* 3.8 4.1  CL 100 107 107  CO2 22  --  24  BUN 10 <3* 5*  CREATININE 0.53 0.60 0.72  CALCIUM 9.3  --  9.2  GLUCOSE 103* 86 87    CBG (last 3)   Recent Labs  01/18/13 0032 01/18/13 0624 01/18/13 0812  GLUCAP 77 90 83    Scheduled Meds: . pantoprazole (PROTONIX) IV  40 mg Intravenous Q24H    Continuous Infusions: . sodium chloride 100 mL/hr at 01/18/13 1610    Past Medical History  Diagnosis Date  . Constipation     treated with diet change  . Headache     migraines  . Hx of chlamydia infection   . Difficult intubation   . Pancreatitis     Past Surgical History  Procedure Laterality Date  . Wisdom tooth extraction    . Cesarean section  10/04/2012    Procedure: CESAREAN SECTION;  Surgeon: Bing Plume, MD;  Location: [facility];  Service: Obstetrics;  Laterality: N/A;  . Cholecystectomy  12/02/2012    Procedure: LAPAROSCOPIC CHOLECYSTECTOMY WITH INTRAOPERATIVE CHOLANGIOGRAM;  Surgeon: Romie Levee, MD;  Location: [facility];  Service: General;  Laterality: N/A;  .  Ercp  12/04/2012    Procedure: ENDOSCOPIC RETROGRADE CHOLANGIOPANCREATOGRAPHY (ERCP);  Surgeon: Barrie Folk, MD;  Location: [facility];  Service: Endoscopy;  Laterality: N/A;  . Ercp  12/05/2012    Procedure: ENDOSCOPIC RETROGRADE CHOLANGIOPANCREATOGRAPHY (ERCP);  Surgeon: Barrie Folk, MD;  Location: [facility];  Service: Endoscopy;  Laterality: N/A;    Clarene Duke RD, LDN Pager [phone] After Hours pager [phone]

## 2013-01-18 NOTE — Progress Notes (Signed)
NURSING PROGRESS NOTE  Valerie Cortez [id number] Admission Data: 01/18/2013 2:16 AM Attending Provider: Clydia Llano, MD WUJ:[id number],NWGNFA, PA-C Code Status: full   Valerie Cortez is a 22 y.o. female patient admitted from ED  No acute distress noted.  No c/o shortness of breath, no c/o chest pain.   Blood pressure 104/77, pulse 71, temperature 97.9 F (36.6 C), temperature source Oral, resp. rate 20, height 5\' 2"  (1.575 m), weight 64.1 kg (141 lb 5 oz), last menstrual period 01/08/2013, SpO2 98.00%.   IV Fluids:  IV in place, occlusive dsg intact without redness, IV cath forearm right, condition patent and no redness normal saline.   Allergies:  Fentanyl; Oxycodone; Peanut-containing drug products; and Red dye  Past Medical History:   has a past medical history of Constipation; Headache; chlamydia infection; Difficult intubation; and Pancreatitis.  Past Surgical History:   has past surgical history that includes Wisdom tooth extraction; Cesarean section (10/04/2012); Cholecystectomy (12/02/2012); ERCP (12/04/2012); and ERCP (12/05/2012).  Social History:   reports that she has never smoked. She has never used smokeless tobacco. She reports that she does not drink alcohol or use illicit drugs.  Skin: is remarkable   Orientation to room, and floor completed with information packet given to patient/family. Admission INP armband ID verified with patient/family, and in place.   SR up x 2, fall assessment complete, with patient and family able to verbalize understanding of risk associated with falls, and verbalized understanding to call for assistance before getting out of bed.   Call light within reach. Patient able to voice and demonstrate understanding of unit orientation instructions.   Will continue to evaluate and treat per MD orders.  Reba Hulett, Quillian Quince, RN

## 2013-01-18 NOTE — Progress Notes (Signed)
Hypoglycemic Event  CBG: 65  Treatment: D50 IV 25 mL  Symptoms: Nervous/irritable  Follow-up CBG: Time: CBG Result:  Possible Reasons for Event: Inadequate meal intake  Comments/MD notified:    Wheeler Incorvaia Consuella Lose  Remember to initiate Hypoglycemia Order Set & complete

## 2013-01-18 NOTE — Progress Notes (Signed)
Addendum  Patient seen and examined, chart and data base reviewed.  I agree with the above assessment and plan.  For full details please see Mrs. Algis Downs PA note.  Nausea/vomiting abdominal pain, recent history of ERCP  Gastroenterology to evaluate, orders for HIDA scan noted.   Clint Lipps, MD [facility] Pager: [phone] 01/18/2013, 4:10 PM

## 2013-01-19 ENCOUNTER — Encounter (HOSPITAL_COMMUNITY)
Admission: EM | Disposition: A | Discharge status: Home / Self Care | Payer: Self-pay | Source: Home / Self Care | Attending: Internal Medicine

## 2013-01-19 ENCOUNTER — Inpatient Hospital Stay (HOSPITAL_COMMUNITY): Payer: BC Managed Care – PPO

## 2013-01-19 ENCOUNTER — Encounter (HOSPITAL_COMMUNITY): Payer: Self-pay | Admitting: Radiology

## 2013-01-19 LAB — COMPREHENSIVE METABOLIC PANEL
Albumin: 3.1 g/dL — ABNORMAL LOW (ref 3.5–5.2)
Alkaline Phosphatase: 109 U/L (ref 39–117)
BUN: 4 mg/dL — ABNORMAL LOW (ref 6–23)
Calcium: 8.8 mg/dL (ref 8.4–10.5)
Creatinine, Ser: 0.67 mg/dL (ref 0.50–1.10)
GFR calc Af Amer: >90 mL/min (ref >90)
Glucose, Bld: 78 mg/dL (ref 70–99)
Total Protein: 5.9 g/dL — ABNORMAL LOW (ref 6.0–8.3)

## 2013-01-19 LAB — GLUCOSE, CAPILLARY
Glucose-Capillary: 70 mg/dL (ref 70–99)
Glucose-Capillary: 72 mg/dL (ref 70–99)
Glucose-Capillary: 74 mg/dL (ref 70–99)

## 2013-01-19 SURGERY — ULTRASOUND, UPPER GI TRACT, ENDOSCOPIC
Anesthesia: Monitor Anesthesia Care

## 2013-01-19 MED ORDER — HYDROMORPHONE HCL PF 1 MG/ML IJ SOLN
1.0000 mg | INTRAMUSCULAR | Status: DC | PRN
  Administered 2013-01-19 – 2013-01-21 (×11): 1 mg via INTRAVENOUS
  Filled 2013-01-19 (×11): qty 1

## 2013-01-19 NOTE — ED Provider Notes (Signed)
Medical screening examination/treatment/procedure(s) were conducted as a shared visit with non-physician practitioner(s) and myself.  I personally evaluated the patient during the encounter  Spoke to [facility] for admission.  They recommend full inpatient stay I also spoke to GI specialist Dr Matthias Hughs who recommended admit  Joya Gaskins, MD 01/19/13 [phone]

## 2013-01-19 NOTE — Progress Notes (Signed)
25 ml of dextrose was given IV by Ginger Gleason who forgot to scan the medication for a CBG of 65.

## 2013-01-19 NOTE — Progress Notes (Signed)
[facility] Progress Note  Subjective: The patient still having abdominal pain in the low epigastric to periumbilical area. Today she relates to me that this is actually been going on prior to her C-section after her C-section and for her gallstone pancreatitis. She cannot really separate out the various pains. Still is prone to vomiting and has not  Objective: Vital signs in last 24 hours: Temp:  [97.6 F (36.4 C)-98.4 F (36.9 C)] 97.6 F (36.4 C) (03/07 1408) Pulse Rate:  [64-72] 64 (03/07 1408) Resp:  [16-17] 17 (03/07 1408) BP: (88-104)/(55-67) 95/55 mmHg (03/07 1417) SpO2:  [97 %-100 %] 97 % (03/07 1408) Weight:  [65 kg (143 lb 4.8 oz)] 65 kg (143 lb 4.8 oz) (03/07 0426) Weight change: 0.9 kg (1 lb 15.7 oz)   PE: Abdomen soft tender mainly over the periumbilical area and epigastric area appeared  Lab Results: Results for orders placed during the hospital encounter of 01/17/13 (from the past 24 hour(s))  GLUCOSE, CAPILLARY     Status: Abnormal   Collection Time    01/18/13  7:30 PM      Result Value Range   Glucose-Capillary 65 (*) 70 - 99 mg/dL   Comment 1 Documented in Chart     Comment 2 Notify RN    GLUCOSE, CAPILLARY     Status: Abnormal   Collection Time    01/18/13  8:03 PM      Result Value Range   Glucose-Capillary 124 (*) 70 - 99 mg/dL   Comment 1 Documented in Chart     Comment 2 Notify RN    GLUCOSE, CAPILLARY     Status: None   Collection Time    01/19/13 12:33 AM      Result Value Range   Glucose-Capillary 72  70 - 99 mg/dL   Comment 1 Documented in Chart     Comment 2 Notify RN    GLUCOSE, CAPILLARY     Status: None   Collection Time    01/19/13  1:10 AM      Result Value Range   Glucose-Capillary 70  70 - 99 mg/dL  GLUCOSE, CAPILLARY     Status: None   Collection Time    01/19/13  5:36 AM      Result Value Range   Glucose-Capillary 74  70 - 99 mg/dL   Comment 1 Documented in Chart     Comment 2 Notify RN    COMPREHENSIVE  METABOLIC PANEL     Status: Abnormal   Collection Time    01/19/13  6:53 AM      Result Value Range   Sodium 138  135 - 145 mEq/L   Potassium 3.2 (*) 3.5 - 5.1 mEq/L   Chloride 104  96 - 112 mEq/L   CO2 24  19 - 32 mEq/L   Glucose, Bld 78  70 - 99 mg/dL   BUN 4 (*) 6 - 23 mg/dL   Creatinine, Ser 1.61  0.50 - 1.10 mg/dL   Calcium 8.8  8.4 - 09.6 mg/dL   Total Protein 5.9 (*) 6.0 - 8.3 g/dL   Albumin 3.1 (*) 3.5 - 5.2 g/dL   AST 17  0 - 37 U/L   ALT 20  0 - 35 U/L   Alkaline Phosphatase 109  39 - 117 U/L   Total Bilirubin 0.3  0.3 - 1.2 mg/dL   GFR calc non Af Amer >90  >90 mL/min   GFR calc Af Amer >90  >90  mL/min  LIPASE, BLOOD     Status: None   Collection Time    01/19/13  6:53 AM      Result Value Range   Lipase 48  11 - 59 U/L  GLUCOSE, CAPILLARY     Status: None   Collection Time    01/19/13 11:55 AM      Result Value Range   Glucose-Capillary 76  70 - 99 mg/dL  GLUCOSE, CAPILLARY     Status: None   Collection Time    01/19/13  2:06 PM      Result Value Range   Glucose-Capillary 81  70 - 99 mg/dL    Studies/Results: Dg Chest 2 View  01/17/2013  *RADIOLOGY REPORT*  Clinical Data: Cough, concern for pneumonia  CHEST - 2 VIEW  Comparison: None.  Findings: Normal mediastinum and cardiac silhouette.  Normal pulmonary  vasculature.  No evidence of effusion, infiltrate, or pneumothorax.  No acute bony abnormality. The the ground-glass opacities in the right lower lobe on comparison CT are not evident and may be occult by plain film radiography.  IMPRESSION: No acute cardiopulmonary process.   Original Report Authenticated By: Genevive Bi, M.D.    Nm Hepatobiliary  01/18/2013  *RADIOLOGY REPORT*  Clinical Data:  Abdominal pain following cholecystectomy on 12/11/2012.  Concern for bile leak or biliary obstruction.  NUCLEAR MEDICINE HEPATOBILIARY IMAGING  Technique:  Sequential images of the abdomen were obtained out to 60 minutes following intravenous administration of  radiopharmaceutical.  Radiopharmaceutical:  5.66mCi Tc-20m Choletec  Comparison:  CT scan 01/11/2013  Findings: There is uniform uptake of radiotracer within the liver. Counts are present within the duodenum by 35 minutes indicating patent common bile duct.  Counts continue to flow into the small bowel over the course of the study.  There is no evidence of biliary leak or biloma.  IMPRESSION:  1.  No evidence of bile leak.  2.  Patent common bile duct.   Original Report Authenticated By: Genevive Bi, M.D.       Assessment: Very puzzling case of abdominal pain and presumed related to previous biliary pancreatitis cholecystectomy or ERCP with little evidence to suggest this.  Plan: Given her description of antecedent pain Will get CT enterography to make sure we are not missing any small bowel pathology. If this is unrevealing I may have her obstetrician comes here. Still tentatively consider an EUS with ERCP next week if positive findings an EUS.    HAYES,JOHN C 01/19/2013, 2:43 PM

## 2013-01-19 NOTE — Progress Notes (Signed)
[facility] PROGRESS NOTE  Valerie Cortez ZHY:[id number] DOB: [birth year] DOA: 01/17/2013 PCP: Arlyss Queen  HPI/Subjective: Still complaining about abdominal pain, not controlled with half milligram of Dilaudid.  Brief History Valerie Cortez is a 22 y.o. female who had gallstone pancreatitis and underwent laparoscopic cholecystectomy in December 02, 2012.  At that time also had ERCP and sphincterotomy.  Presents with complaints of persistent abdominal pain which has been gradually worsening. The pain is located mostly in the epigastric area with nausea vomiting and unable to keep anything down. Patient also has been having watery diarrhea for last few weeks since surgery. She had gone to the ER last week and has had CT abdomen and pelvis which was unremarkable except for possible pneumonia and patient was prescribed antibiotics (levaquin) which she has taken the last 5 days. At this time patient has come with persistent pain and gastroenterologist on call for Dr. Madilyn Fireman, Dr. Matthias Hughs was consulted.   Assessment/Plan:  Nausea / Vomiting / Abdominal Pain Had C-section on 10/04/2012, cholecystectomy on 12/02/2012 and ERCP on 12/04/2012. [name] Madilyn Fireman) on board.  Appreciate their consultation. Had normal recent CT scan on 01/11/2013 HIDA scan is negative for bile leak, bile that seems to be okay. Patient is still n.p.o., complaining about nausea and abdominal pain. Awaits recommendation from Dr. Madilyn Fireman, regarding the plan of care.  Diarrhea Seems resolved. Patient has had solid stool. No further episodes of diarrhea at this point. Stool cultures pending  Recent Pneumonia Took Levaquin OP for 5 days. Chest xray negative. Patient not symptomatic.  Hypokalemia -Likely secondary to IV fluids without potassium supplementation, replete orally.  Code Status: Full Family Communication: Mother and husband at bedside Disposition Plan: inpatient.   Consultants:  [facility]  Procedures:    Antibiotics:      Objective: Filed Vitals:   01/18/13 0456 01/18/13 0504 01/18/13 2154 01/19/13 0426  BP: 102/59  104/67 96/61  Pulse: 69  67 72  Temp: 97.8 F (36.6 C)  98.2 F (36.8 C) 98.4 F (36.9 C)  TempSrc: Oral  Oral Oral  Resp: 18  16 16   Height:      Weight:  64.3 kg (141 lb 12.1 oz)  65 kg (143 lb 4.8 oz)  SpO2: 100%  100% 97%    Intake/Output Summary (Last 24 hours) at 01/19/13 1205 Last data filed at 01/18/13 1900  Gross per 24 hour  Intake 1336.67 ml  Output      0 ml  Net 1336.67 ml   Filed Weights   01/17/13 2100 01/18/13 0504 01/19/13 0426  Weight: 64.1 kg (141 lb 5 oz) 64.3 kg (141 lb 12.1 oz) 65 kg (143 lb 4.8 oz)    Exam:   General:  Wd, Wn, female, lying in bed.  Family at bedside  Cardiovascular: rrr, no m/r/g, no lle  Respiratory: no w/c/r, no accessory muscle use  Abdomen: soft, min tender in epigastrum.  I don't hear bowel sounds.  Not distended, no masses  Musculoskeletal: 5/5 strength in all 4 extremities.  Data Reviewed: Basic Metabolic Panel:  Recent Labs Lab 01/17/13 1544 01/18/13 0620 01/19/13 0653  NA 140 141 138  K 3.8 4.1 3.2*  CL 107 107 104  CO2  --  24 24  GLUCOSE 86 87 78  BUN <3* 5* 4*  CREATININE 0.60 0.72 0.67  CALCIUM  --  9.2 8.8   Liver Function Tests:  Recent Labs Lab 01/17/13 1821 01/18/13 0620 01/19/13 0653  AST 29 17  17  ALT 27 21 20   ALKPHOS 146* 124* 109  BILITOT 0.3 0.3 0.3  PROT 8.0 6.3 5.9*  ALBUMIN 4.3 3.5 3.1*    Recent Labs Lab 01/17/13 1500 01/18/13 0620 01/19/13 0653  LIPASE 72* 53 48   CBC:  Recent Labs Lab 01/17/13 1544 01/17/13 1640 01/18/13 0620  WBC  --  7.5 9.1  NEUTROABS  --  4.0  --   HGB 14.3 14.0 12.1  HCT 42.0 39.8 35.9*  MCV  --  73.0* 76.1*  PLT  --  234 201   CBG:  Recent Labs Lab 01/18/13 2003 01/19/13 0033 01/19/13 0110 01/19/13 0536 01/19/13 1155  GLUCAP 124* 72 70 74 76     Studies: Dg Chest 2  View  01/17/2013  *RADIOLOGY REPORT*  Clinical Data: Cough, concern for pneumonia  CHEST - 2 VIEW  Comparison: None.  Findings: Normal mediastinum and cardiac silhouette.  Normal pulmonary  vasculature.  No evidence of effusion, infiltrate, or pneumothorax.  No acute bony abnormality. The the ground-glass opacities in the right lower lobe on comparison CT are not evident and may be occult by plain film radiography.  IMPRESSION: No acute cardiopulmonary process.   Original Report Authenticated By: Genevive Bi, M.D.    Nm Hepatobiliary  01/18/2013  *RADIOLOGY REPORT*  Clinical Data:  Abdominal pain following cholecystectomy on 12/11/2012.  Concern for bile leak or biliary obstruction.  NUCLEAR MEDICINE HEPATOBILIARY IMAGING  Technique:  Sequential images of the abdomen were obtained out to 60 minutes following intravenous administration of radiopharmaceutical.  Radiopharmaceutical:  5.58mCi Tc-58m Choletec  Comparison:  CT scan 01/11/2013  Findings: There is uniform uptake of radiotracer within the liver. Counts are present within the duodenum by 35 minutes indicating patent common bile duct.  Counts continue to flow into the small bowel over the course of the study.  There is no evidence of biliary leak or biloma.  IMPRESSION:  1.  No evidence of bile leak.  2.  Patent common bile duct.   Original Report Authenticated By: Genevive Bi, M.D.     Scheduled Meds: . pantoprazole (PROTONIX) IV  40 mg Intravenous Q24H   Continuous Infusions: . dextrose 5 % and 0.9% NaCl 75 mL/hr at 01/19/13 0011    Active Problems:   Abdominal pain   Nausea vomiting and diarrhea   SOB (shortness of breath)   ELMAHI,MUTAZ A,  [phone]  [facility] If 7PM-7AM, please contact night-coverage at [email or web address], password [facility] 01/19/2013, 12:05 PM  LOS: 2 days

## 2013-01-19 NOTE — Progress Notes (Signed)
D5NS started at midnight. Pt CBG now is 70 and pt is asymptomatic. On call MD notified. Will recheck blood sugar at 0600. Will continue to monitor.

## 2013-01-20 ENCOUNTER — Inpatient Hospital Stay (HOSPITAL_COMMUNITY): Payer: BC Managed Care – PPO

## 2013-01-20 LAB — GLUCOSE, CAPILLARY
Glucose-Capillary: 74 mg/dL (ref 70–99)
Glucose-Capillary: 84 mg/dL (ref 70–99)
Glucose-Capillary: 82 mg/dL (ref 70–99)

## 2013-01-20 MED ORDER — HYDROCODONE-ACETAMINOPHEN 5-325 MG PO TABS
1.0000 | ORAL_TABLET | ORAL | Status: DC | PRN
  Administered 2013-01-20: 2 via ORAL
  Filled 2013-01-20: qty 2

## 2013-01-20 MED ORDER — METOCLOPRAMIDE HCL 5 MG/ML IJ SOLN
5.0000 mg | Freq: Four times a day (QID) | INTRAMUSCULAR | Status: DC
  Administered 2013-01-20 – 2013-01-22 (×9): 5 mg via INTRAVENOUS
  Filled 2013-01-20 (×12): qty 1

## 2013-01-20 NOTE — Progress Notes (Signed)
[facility] PROGRESS NOTE  Valerie Cortez ZOX:[id number] DOB: June 21, 1991 DOA: 01/17/2013 PCP: Arlyss Queen  HPI/Subjective: Still complaining about abdominal pain, asking about more pain medications, specifically Dilaudid.    Brief History Valerie Cortez is a 22 y.o. female who had gallstone pancreatitis and underwent laparoscopic cholecystectomy in December 02, 2012.  At that time also had ERCP and sphincterotomy.  Presents with complaints of persistent abdominal pain which has been gradually worsening. The pain is located mostly in the epigastric area with nausea vomiting and unable to keep anything down. Patient also has been having watery diarrhea for last few weeks since surgery. She had gone to the ER last week and has had CT abdomen and pelvis which was unremarkable except for possible pneumonia and patient was prescribed antibiotics (levaquin) which she has taken the last 5 days. At this time patient has come with persistent pain and gastroenterologist on call for Dr. Madilyn Fireman consulted.   Assessment/Plan:  Nausea / Vomiting / Abdominal Pain Had C-section on 10/04/2012, cholecystectomy on 12/02/2012 and ERCP on 12/04/2012. [name] Madilyn Fireman) on board.  Appreciate his help Had normal recent CT scan on 01/11/2013 HIDA scan is negative for bile leak, bile that seems to be okay. CT enterography done on 01/19/13, showed mild thickening of the ascending colon. Reported vomiting last night with the oral contrast for the CT, she was trying to eat her clears this morning.  Diarrhea Seems resolved. Patient has had solid stool. No further episodes of diarrhea at this point. Stool cultures pending  Recent Pneumonia Took Levaquin OP for 5 days. Chest xray negative. Patient not symptomatic.  Hypokalemia -Likely secondary to IV fluids without potassium supplementation, replete orally. Check BMP in a.m.  Code Status: Full Family Communication: sister is at bedside Disposition Plan:  inpatient.   Consultants:  [facility]  Procedures:    Antibiotics:      Objective: Filed Vitals:   01/19/13 1417 01/19/13 1813 01/19/13 2117 01/20/13 0521  BP: 95/55 113/85 98/65 91/58   Pulse:  78 70 63  Temp:  98.3 F (36.8 C) 98.1 F (36.7 C) 98.4 F (36.9 C)  TempSrc:   Oral Oral  Resp:  18 18 20   Height:      Weight:    65 kg (143 lb 4.8 oz)  SpO2:  95% 98% 97%    Intake/Output Summary (Last 24 hours) at 01/20/13 1009 Last data filed at 01/20/13 0648  Gross per 24 hour  Intake 2876.25 ml  Output      0 ml  Net 2876.25 ml   Filed Weights   01/18/13 0504 01/19/13 0426 01/20/13 0521  Weight: 64.3 kg (141 lb 12.1 oz) 65 kg (143 lb 4.8 oz) 65 kg (143 lb 4.8 oz)    Exam:   General:  Wd, Wn, female, lying in bed.  Family at bedside  Cardiovascular: rrr, no m/r/g, no lle  Respiratory: no w/c/r, no accessory muscle use  Abdomen: soft, min tender in epigastrum.  I don't hear bowel sounds.  Not distended, no masses  Musculoskeletal: 5/5 strength in all 4 extremities.  Data Reviewed: Basic Metabolic Panel:  Recent Labs Lab 01/17/13 1544 01/18/13 0620 01/19/13 0653  NA 140 141 138  K 3.8 4.1 3.2*  CL 107 107 104  CO2  --  24 24  GLUCOSE 86 87 78  BUN <3* 5* 4*  CREATININE 0.60 0.72 0.67  CALCIUM  --  9.2 8.8   Liver Function Tests:  Recent Labs Lab 01/17/13  1821 01/18/13 0620 01/19/13 0653  AST 29 17 17   ALT 27 21 20   ALKPHOS 146* 124* 109  BILITOT 0.3 0.3 0.3  PROT 8.0 6.3 5.9*  ALBUMIN 4.3 3.5 3.1*    Recent Labs Lab 01/17/13 1500 01/18/13 0620 01/19/13 0653  LIPASE 72* 53 48   CBC:  Recent Labs Lab 01/17/13 1544 01/17/13 1640 01/18/13 0620  WBC  --  7.5 9.1  NEUTROABS  --  4.0  --   HGB 14.3 14.0 12.1  HCT 42.0 39.8 35.9*  MCV  --  73.0* 76.1*  PLT  --  234 201   CBG:  Recent Labs Lab 01/19/13 1406 01/19/13 1809 01/19/13 2358 01/20/13 0651 01/20/13 0727  GLUCAP 81 78 71 74 84     Studies: Nm  Hepatobiliary  01/18/2013  *RADIOLOGY REPORT*  Clinical Data:  Abdominal pain following cholecystectomy on 12/11/2012.  Concern for bile leak or biliary obstruction.  NUCLEAR MEDICINE HEPATOBILIARY IMAGING  Technique:  Sequential images of the abdomen were obtained out to 60 minutes following intravenous administration of radiopharmaceutical.  Radiopharmaceutical:  5.18mCi Tc-31m Choletec  Comparison:  CT scan 01/11/2013  Findings: There is uniform uptake of radiotracer within the liver. Counts are present within the duodenum by 35 minutes indicating patent common bile duct.  Counts continue to flow into the small bowel over the course of the study.  There is no evidence of biliary leak or biloma.  IMPRESSION:  1.  No evidence of bile leak.  2.  Patent common bile duct.   Original Report Authenticated By: Genevive Bi, M.D.    Ct Entero Abd/pelvis W/cm  01/19/2013  *RADIOLOGY REPORT*  Clinical Data:  Abdominal pain, nausea/vomiting/diarrhea, status post cholecystectomy, recent C-section, recent pancreatitis  CT ABDOMEN AND PELVIS WITH CONTRAST (CT ENTEROGRAPHY)  Technique:  Multidetector CT of the abdomen and pelvis during bolus administration of intravenous contrast. Negative oral contrast VoLumen was given.  Contrast:  100 ml Omnipaque-300 IV  Comparison:  01/11/2013  Findings: Lung bases are clear.  Liver, spleen, pancreas, and adrenal glands are within normal limits.  Status post cholecystectomy.  No intrahepatic or extrahepatic ductal dilatation.  Kidneys are within normal limits.  No hydronephrosis.  No evidence of bowel obstruction.  Normal appendix.  Possible mild wall thickening involving the ascending colon (coronal image 47). Left colon is decompressed.  No evidence of abdominal aortic aneurysm.  Retroaortic left renal vein.  No suspicious abdominopelvic lymphadenopathy.  Uterus and bilateral ovaries are unremarkable.  Small volume pelvic ascites, likely physiologic.  Bladder is within normal limits.   Visualized osseous structures are within normal limits.  IMPRESSION: Possible mild wall thickening involving the ascending colon, correlate for infectious/inflammatory colitis.  No evidence of bowel obstruction.  Normal appendix.   Original Report Authenticated By: Charline Bills, M.D.     Scheduled Meds: . pantoprazole (PROTONIX) IV  40 mg Intravenous Q24H   Continuous Infusions: . dextrose 5 % and 0.9% NaCl 75 mL/hr at 01/20/13 0248    Active Problems:   Abdominal pain   Nausea vomiting and diarrhea   SOB (shortness of breath)   ELMAHI,MUTAZ A,  [phone]  [facility] If 7PM-7AM, please contact night-coverage at [email or web address], password [facility] 01/20/2013, 10:09 AM  LOS: 3 days

## 2013-01-20 NOTE — Progress Notes (Signed)
[facility] Progress Note  Subjective: The patient is continuing to have abdominal pain leaning to the left periumbilical area of nausea and vomiting. No real change in symptoms  Objective: Vital signs in last 24 hours: Temp:  [97.6 F (36.4 C)-98.4 F (36.9 C)] 98.4 F (36.9 C) (03/08 0521) Pulse Rate:  [63-78] 63 (03/08 0521) Resp:  [17-20] 20 (03/08 0521) BP: (88-113)/(55-85) 91/58 mmHg (03/08 0521) SpO2:  [95 %-98 %] 97 % (03/08 0521) Weight:  [65 kg (143 lb 4.8 oz)] 65 kg (143 lb 4.8 oz) (03/08 0521) Weight change: 0 kg (0 lb)   PE: Abdominal tenderness persists without guarding or  Lab Results: Results for orders placed during the hospital encounter of 01/17/13 (from the past 24 hour(s))  GLUCOSE, CAPILLARY     Status: None   Collection Time    01/19/13 11:55 AM      Result Value Range   Glucose-Capillary 76  70 - 99 mg/dL  GLUCOSE, CAPILLARY     Status: None   Collection Time    01/19/13  2:06 PM      Result Value Range   Glucose-Capillary 81  70 - 99 mg/dL  GLUCOSE, CAPILLARY     Status: None   Collection Time    01/19/13  6:09 PM      Result Value Range   Glucose-Capillary 78  70 - 99 mg/dL  GLUCOSE, CAPILLARY     Status: None   Collection Time    01/19/13 11:58 PM      Result Value Range   Glucose-Capillary 71  70 - 99 mg/dL   Comment 1 Notify RN     Comment 2 Documented in Chart    GLUCOSE, CAPILLARY     Status: None   Collection Time    01/20/13  6:51 AM      Result Value Range   Glucose-Capillary 74  70 - 99 mg/dL  GLUCOSE, CAPILLARY     Status: None   Collection Time    01/20/13  7:27 AM      Result Value Range   Glucose-Capillary 84  70 - 99 mg/dL    Studies/Results: Nm Hepatobiliary  01/18/2013  *RADIOLOGY REPORT*  Clinical Data:  Abdominal pain following cholecystectomy on 12/11/2012.  Concern for bile leak or biliary obstruction.  NUCLEAR MEDICINE HEPATOBILIARY IMAGING  Technique:  Sequential images of the abdomen were obtained  out to 60 minutes following intravenous administration of radiopharmaceutical.  Radiopharmaceutical:  5.19mCi Tc-64m Choletec  Comparison:  CT scan 01/11/2013  Findings: There is uniform uptake of radiotracer within the liver. Counts are present within the duodenum by 35 minutes indicating patent common bile duct.  Counts continue to flow into the small bowel over the course of the study.  There is no evidence of biliary leak or biloma.  IMPRESSION:  1.  No evidence of bile leak.  2.  Patent common bile duct.   Original Report Authenticated By: Genevive Bi, M.D.    Ct Entero Abd/pelvis W/cm  01/19/2013  *RADIOLOGY REPORT*  Clinical Data:  Abdominal pain, nausea/vomiting/diarrhea, status post cholecystectomy, recent C-section, recent pancreatitis  CT ABDOMEN AND PELVIS WITH CONTRAST (CT ENTEROGRAPHY)  Technique:  Multidetector CT of the abdomen and pelvis during bolus administration of intravenous contrast. Negative oral contrast VoLumen was given.  Contrast:  100 ml Omnipaque-300 IV  Comparison:  01/11/2013  Findings: Lung bases are clear.  Liver, spleen, pancreas, and adrenal glands are within normal limits.  Status post cholecystectomy.  No intrahepatic  or extrahepatic ductal dilatation.  Kidneys are within normal limits.  No hydronephrosis.  No evidence of bowel obstruction.  Normal appendix.  Possible mild wall thickening involving the ascending colon (coronal image 47). Left colon is decompressed.  No evidence of abdominal aortic aneurysm.  Retroaortic left renal vein.  No suspicious abdominopelvic lymphadenopathy.  Uterus and bilateral ovaries are unremarkable.  Small volume pelvic ascites, likely physiologic.  Bladder is within normal limits.  Visualized osseous structures are within normal limits.  IMPRESSION: Possible mild wall thickening involving the ascending colon, correlate for infectious/inflammatory colitis.  No evidence of bowel obstruction.  Normal appendix.   Original Report Authenticated By:  Charline Bills, M.D.       Assessment: Unexplained abdominal pain nausea and vomiting unclear whether related to gallstone pancreatitis one month ago or another process preceding that  Plan: Will obtain pelvic ultrasound to better rule out any GYN pathology. Will try her on low-dose Reglan for nausea. Will discuss possible EUS with ERCP if positive findings next week.    HAYES,JOHN C 01/20/2013, 10:20 AM

## 2013-01-21 LAB — STOOL CULTURE

## 2013-01-21 LAB — GLUCOSE, CAPILLARY: Glucose-Capillary: 86 mg/dL (ref 70–99)

## 2013-01-21 NOTE — Progress Notes (Signed)
[facility] Progress Note  Subjective: Patient continues to complain of diffuse abdominal pain nausea and vomiting as before Objective: Vital signs in last 24 hours: Temp:  [98.1 F (36.7 C)-98.7 F (37.1 C)] 98.1 F (36.7 C) (03/09 0558) Pulse Rate:  [64-65] 65 (03/09 0558) Resp:  [18-20] 20 (03/09 0558) BP: (90-109)/(52) 90/52 mmHg (03/09 0558) SpO2:  [96 %-98 %] 96 % (03/09 0558) Weight:  [66.5 kg (146 lb 9.7 oz)] 66.5 kg (146 lb 9.7 oz) (03/09 0558) Weight change: 1.5 kg (3 lb 4.9 oz)   PE: Abdomen tender diffusely  Lab Results: Results for orders placed during the hospital encounter of 01/17/13 (from the past 24 hour(s))  GLUCOSE, CAPILLARY     Status: None   Collection Time    01/20/13  2:13 PM      Result Value Range   Glucose-Capillary 82  70 - 99 mg/dL  GLUCOSE, CAPILLARY     Status: None   Collection Time    01/20/13  6:01 PM      Result Value Range   Glucose-Capillary 88  70 - 99 mg/dL  GLUCOSE, CAPILLARY     Status: None   Collection Time    01/21/13 12:05 AM      Result Value Range   Glucose-Capillary 90  70 - 99 mg/dL   Comment 1 Notify RN    GLUCOSE, CAPILLARY     Status: None   Collection Time    01/21/13  5:51 AM      Result Value Range   Glucose-Capillary 86  70 - 99 mg/dL  GLUCOSE, CAPILLARY     Status: None   Collection Time    01/21/13 12:11 PM      Result Value Range   Glucose-Capillary 89  70 - 99 mg/dL   Comment 1 Notify RN      Studies/Results: US Transvaginal Non-ob  01/20/2013  *RADIOLOGY REPORT*  Clinical Data: Pelvic pain.  TRANSABDOMINAL AND TRANSVAGINAL ULTRASOUND OF PELVIS Technique:  Both transabdominal and transvaginal ultrasound examinations of the pelvis were performed. Transabdominal technique was performed for global imaging of the pelvis including uterus, ovaries, adnexal regions, and pelvic cul-de-sac.  It was necessary to proceed with endovaginal exam following the transabdominal exam to visualize the uterus,  ovaries, and adnexa  .  Comparison:  CT of 1 day prior  Findings:  Uterus: Normal in size and appearance  Endometrium: Normal, 11 mm.  Right ovary:  4.3 x 2.3 x 2.6 cm.  A dominant follicle or cyst measures 2.5 x 1.7 x 2.0 cm.  This does have peripheral hyperemia, including on image 47.  Left ovary: 3.7 x 1.9 x 2.7 cm. Normal in morphology.  Other findings: No free fluid  IMPRESSION: Right ovarian dominant follicle or cyst with suggestion of peripheral hyperemia.  This therefore is suspicious for a corpus luteal cyst.  Otherwise, normal appearance of the pelvis, without explanation for pelvic pain.   Original Report Authenticated By: Jeronimo Greaves, M.D.    US Pelvis Complete  01/20/2013  *RADIOLOGY REPORT*  Clinical Data: Pelvic pain.  TRANSABDOMINAL AND TRANSVAGINAL ULTRASOUND OF PELVIS Technique:  Both transabdominal and transvaginal ultrasound examinations of the pelvis were performed. Transabdominal technique was performed for global imaging of the pelvis including uterus, ovaries, adnexal regions, and pelvic cul-de-sac.  It was necessary to proceed with endovaginal exam following the transabdominal exam to visualize the uterus, ovaries, and adnexa  .  Comparison:  CT of 1 day prior  Findings:  Uterus: Normal  in size and appearance  Endometrium: Normal, 11 mm.  Right ovary:  4.3 x 2.3 x 2.6 cm.  A dominant follicle or cyst measures 2.5 x 1.7 x 2.0 cm.  This does have peripheral hyperemia, including on image 47.  Left ovary: 3.7 x 1.9 x 2.7 cm. Normal in morphology.  Other findings: No free fluid  IMPRESSION: Right ovarian dominant follicle or cyst with suggestion of peripheral hyperemia.  This therefore is suspicious for a corpus luteal cyst.  Otherwise, normal appearance of the pelvis, without explanation for pelvic pain.   Original Report Authenticated By: Jeronimo Greaves, M.D.    Ct Entero Abd/pelvis W/cm  01/19/2013  *RADIOLOGY REPORT*  Clinical Data:  Abdominal pain, nausea/vomiting/diarrhea, status post  cholecystectomy, recent C-section, recent pancreatitis  CT ABDOMEN AND PELVIS WITH CONTRAST (CT ENTEROGRAPHY)  Technique:  Multidetector CT of the abdomen and pelvis during bolus administration of intravenous contrast. Negative oral contrast VoLumen was given.  Contrast:  100 ml Omnipaque-300 IV  Comparison:  01/11/2013  Findings: Lung bases are clear.  Liver, spleen, pancreas, and adrenal glands are within normal limits.  Status post cholecystectomy.  No intrahepatic or extrahepatic ductal dilatation.  Kidneys are within normal limits.  No hydronephrosis.  No evidence of bowel obstruction.  Normal appendix.  Possible mild wall thickening involving the ascending colon (coronal image 47). Left colon is decompressed.  No evidence of abdominal aortic aneurysm.  Retroaortic left renal vein.  No suspicious abdominopelvic lymphadenopathy.  Uterus and bilateral ovaries are unremarkable.  Small volume pelvic ascites, likely physiologic.  Bladder is within normal limits.  Visualized osseous structures are within normal limits.  IMPRESSION: Possible mild wall thickening involving the ascending colon, correlate for infectious/inflammatory colitis.  No evidence of bowel obstruction.  Normal appendix.   Original Report Authenticated By: Charline Bills, M.D.       Assessment: Abdominal pain nausea and vomiting, etiology centrally becoming less clear over time, presumed related to previous biliary pancreatitis and/or cholecystectomy and or ERCP although no objective findings for any of this at this point  Plan: Tentative plan was to pursue endoscopic ultrasound and we'll discuss with Dr. Dulce Sellar probably pursue that with ERCP reserved only for positive findings of stones,  On CT enterography there is a subtle finding of left colonic thickening which I do not think match any of her symptoms but she is now becoming more focused on. If EUS negative, colonoscopy could be pursued but anticipate very low yield. Seems to be  an increasing functional component of her pain. Will advance to full liquid diet today at her request.   HAYES,JOHN C 01/21/2013, 12:48 PM

## 2013-01-21 NOTE — Progress Notes (Signed)
[facility] PROGRESS NOTE  Valerie Cortez:[id number] DOB: 02/26/1991 DOA: 01/17/2013 PCP: Arlyss Queen  HPI/Subjective: This complaining about pain, reported nausea and vomiting this morning. Wanted diet to be advanced, and having same pain medications   Brief History Valerie Cortez is a 22 y.o. female who had gallstone pancreatitis and underwent laparoscopic cholecystectomy in December 02, 2012.  At that time also had ERCP and sphincterotomy.  Presents with complaints of persistent abdominal pain which has been gradually worsening. The pain is located mostly in the epigastric area with nausea vomiting and unable to keep anything down. Patient also has been having watery diarrhea for last few weeks since surgery. She had gone to the ER last week and has had CT abdomen and pelvis which was unremarkable except for possible pneumonia and patient was prescribed antibiotics (levaquin) which she has taken the last 5 days. At this time patient has come with persistent pain and gastroenterologist on call for Dr. Madilyn Fireman consulted.   Assessment/Plan:  Nausea / Vomiting / Abdominal Pain Had C-section on 10/04/2012, cholecystectomy on 12/02/2012 and ERCP on 12/04/2012. [name] Madilyn Fireman) on board.  Appreciate his help Had normal recent CT scan on 01/11/2013 HIDA scan is negative for bile leak, bile that seems to be okay. CT enterography done on 01/19/13, showed mild thickening of the ascending colon. Pelvic ultrasound 3/8 is normal GI to consider endoscopic ultrasound.  Diarrhea Seems resolved. Patient has had solid stool. No further episodes of diarrhea at this point. Stool cultures pending  Recent Pneumonia Took Levaquin OP for 5 days. Chest xray negative. Patient not symptomatic.  Hypokalemia -Likely secondary to IV fluids without potassium supplementation, replete orally. Check BMP in a.m.  Code Status: Full Family Communication: Father and mother at bedside Disposition Plan:  inpatient.   Consultants:  [facility]  Procedures:    Antibiotics:      Objective: Filed Vitals:   01/19/13 2117 01/20/13 0521 01/20/13 2143 01/21/13 0558  BP: 98/65 91/58 109/52 90/52  Pulse: 70 63 64 65  Temp: 98.1 F (36.7 C) 98.4 F (36.9 C) 98.7 F (37.1 C) 98.1 F (36.7 C)  TempSrc: Oral Oral Oral Oral  Resp: 18 20 18 20   Height:      Weight:  65 kg (143 lb 4.8 oz)  66.5 kg (146 lb 9.7 oz)  SpO2: 98% 97% 98% 96%    Intake/Output Summary (Last 24 hours) at 01/21/13 1327 Last data filed at 01/21/13 0600  Gross per 24 hour  Intake   1860 ml  Output      0 ml  Net   1860 ml   Filed Weights   01/19/13 0426 01/20/13 0521 01/21/13 0558  Weight: 65 kg (143 lb 4.8 oz) 65 kg (143 lb 4.8 oz) 66.5 kg (146 lb 9.7 oz)    Exam:   General:  Wd, Wn, female, lying in bed.  Family at bedside  Cardiovascular: rrr, no m/r/g, no lle  Respiratory: no w/c/r, no accessory muscle use  Abdomen: soft, min tender in epigastrum. Not distended, no masses  Musculoskeletal: 5/5 strength in all 4 extremities.  Data Reviewed: Basic Metabolic Panel:  Recent Labs Lab 01/17/13 1544 01/18/13 0620 01/19/13 0653  NA 140 141 138  K 3.8 4.1 3.2*  CL 107 107 104  CO2  --  24 24  GLUCOSE 86 87 78  BUN <3* 5* 4*  CREATININE 0.60 0.72 0.67  CALCIUM  --  9.2 8.8   Liver Function Tests:  Recent  Labs Lab 01/17/13 1821 01/18/13 0620 01/19/13 0653  AST 29 17 17   ALT 27 21 20   ALKPHOS 146* 124* 109  BILITOT 0.3 0.3 0.3  PROT 8.0 6.3 5.9*  ALBUMIN 4.3 3.5 3.1*    Recent Labs Lab 01/17/13 1500 01/18/13 0620 01/19/13 0653  LIPASE 72* 53 48   CBC:  Recent Labs Lab 01/17/13 1544 01/17/13 1640 01/18/13 0620  WBC  --  7.5 9.1  NEUTROABS  --  4.0  --   HGB 14.3 14.0 12.1  HCT 42.0 39.8 35.9*  MCV  --  73.0* 76.1*  PLT  --  234 201   CBG:  Recent Labs Lab 02/12/2013 1413 2013/02/12 1801 01/21/13 0005 01/21/13 0551 01/21/13 1211  GLUCAP 82 88 90 86 89      Studies: US Transvaginal Non-ob  02-12-2013  *RADIOLOGY REPORT*  Clinical Data: Pelvic pain.  TRANSABDOMINAL AND TRANSVAGINAL ULTRASOUND OF PELVIS Technique:  Both transabdominal and transvaginal ultrasound examinations of the pelvis were performed. Transabdominal technique was performed for global imaging of the pelvis including uterus, ovaries, adnexal regions, and pelvic cul-de-sac.  It was necessary to proceed with endovaginal exam following the transabdominal exam to visualize the uterus, ovaries, and adnexa  .  Comparison:  CT of 1 day prior  Findings:  Uterus: Normal in size and appearance  Endometrium: Normal, 11 mm.  Right ovary:  4.3 x 2.3 x 2.6 cm.  A dominant follicle or cyst measures 2.5 x 1.7 x 2.0 cm.  This does have peripheral hyperemia, including on image 47.  Left ovary: 3.7 x 1.9 x 2.7 cm. Normal in morphology.  Other findings: No free fluid  IMPRESSION: Right ovarian dominant follicle or cyst with suggestion of peripheral hyperemia.  This therefore is suspicious for a corpus luteal cyst.  Otherwise, normal appearance of the pelvis, without explanation for pelvic pain.   Original Report Authenticated By: Jeronimo Greaves, M.D.    US Pelvis Complete  02/12/2013  *RADIOLOGY REPORT*  Clinical Data: Pelvic pain.  TRANSABDOMINAL AND TRANSVAGINAL ULTRASOUND OF PELVIS Technique:  Both transabdominal and transvaginal ultrasound examinations of the pelvis were performed. Transabdominal technique was performed for global imaging of the pelvis including uterus, ovaries, adnexal regions, and pelvic cul-de-sac.  It was necessary to proceed with endovaginal exam following the transabdominal exam to visualize the uterus, ovaries, and adnexa  .  Comparison:  CT of 1 day prior  Findings:  Uterus: Normal in size and appearance  Endometrium: Normal, 11 mm.  Right ovary:  4.3 x 2.3 x 2.6 cm.  A dominant follicle or cyst measures 2.5 x 1.7 x 2.0 cm.  This does have peripheral hyperemia, including on image 47.   Left ovary: 3.7 x 1.9 x 2.7 cm. Normal in morphology.  Other findings: No free fluid  IMPRESSION: Right ovarian dominant follicle or cyst with suggestion of peripheral hyperemia.  This therefore is suspicious for a corpus luteal cyst.  Otherwise, normal appearance of the pelvis, without explanation for pelvic pain.   Original Report Authenticated By: Jeronimo Greaves, M.D.    Ct Entero Abd/pelvis W/cm  01/19/2013  *RADIOLOGY REPORT*  Clinical Data:  Abdominal pain, nausea/vomiting/diarrhea, status post cholecystectomy, recent C-section, recent pancreatitis  CT ABDOMEN AND PELVIS WITH CONTRAST (CT ENTEROGRAPHY)  Technique:  Multidetector CT of the abdomen and pelvis during bolus administration of intravenous contrast. Negative oral contrast VoLumen was given.  Contrast:  100 ml Omnipaque-300 IV  Comparison:  01/11/2013  Findings: Lung bases are clear.  Liver, spleen, pancreas,  and adrenal glands are within normal limits.  Status post cholecystectomy.  No intrahepatic or extrahepatic ductal dilatation.  Kidneys are within normal limits.  No hydronephrosis.  No evidence of bowel obstruction.  Normal appendix.  Possible mild wall thickening involving the ascending colon (coronal image 47). Left colon is decompressed.  No evidence of abdominal aortic aneurysm.  Retroaortic left renal vein.  No suspicious abdominopelvic lymphadenopathy.  Uterus and bilateral ovaries are unremarkable.  Small volume pelvic ascites, likely physiologic.  Bladder is within normal limits.  Visualized osseous structures are within normal limits.  IMPRESSION: Possible mild wall thickening involving the ascending colon, correlate for infectious/inflammatory colitis.  No evidence of bowel obstruction.  Normal appendix.   Original Report Authenticated By: Charline Bills, M.D.     Scheduled Meds: . metoCLOPramide (REGLAN) injection  5 mg Intravenous Q6H  . pantoprazole (PROTONIX) IV  40 mg Intravenous Q24H   Continuous Infusions: . dextrose 5  % and 0.9% NaCl 75 mL/hr at 01/21/13 1610    Active Problems:   Abdominal pain   Nausea vomiting and diarrhea   SOB (shortness of breath)   ELMAHI,MUTAZ A,  [phone]  [facility] If 7PM-7AM, please contact night-coverage at [email or web address], password [facility] 01/21/2013, 1:27 PM  LOS: 4 days

## 2013-01-22 ENCOUNTER — Encounter (HOSPITAL_COMMUNITY): Payer: Self-pay | Admitting: *Deleted

## 2013-01-22 LAB — COMPREHENSIVE METABOLIC PANEL WITH GFR
ALT: 24 U/L (ref 0–35)
AST: 23 U/L (ref 0–37)
Albumin: 3.4 g/dL — ABNORMAL LOW (ref 3.5–5.2)
Alkaline Phosphatase: 112 U/L (ref 39–117)
BUN: <3 mg/dL — ABNORMAL LOW (ref 6–23)
CO2: 26 meq/L (ref 19–32)
Calcium: 9 mg/dL (ref 8.4–10.5)
Chloride: 105 meq/L (ref 96–112)
Creatinine, Ser: 0.65 mg/dL (ref 0.50–1.10)
GFR calc Af Amer: >90 mL/min
GFR calc non Af Amer: >90 mL/min
Glucose, Bld: 91 mg/dL (ref 70–99)
Potassium: 3.4 meq/L — ABNORMAL LOW (ref 3.5–5.1)
Sodium: 140 meq/L (ref 135–145)
Total Bilirubin: 0.3 mg/dL (ref 0.3–1.2)
Total Protein: 6.2 g/dL (ref 6.0–8.3)

## 2013-01-22 LAB — GLUCOSE, CAPILLARY
Glucose-Capillary: 129 mg/dL — ABNORMAL HIGH (ref 70–99)
Glucose-Capillary: 111 mg/dL — ABNORMAL HIGH (ref 70–99)

## 2013-01-22 MED ORDER — ONDANSETRON HCL 4 MG PO TABS: 4.0000 mg | ORAL_TABLET | Freq: Four times a day (QID) | ORAL | Status: DC | PRN

## 2013-01-22 MED ORDER — HYDROMORPHONE HCL PF 1 MG/ML IJ SOLN: 0.5000 mg | INTRAMUSCULAR | Status: DC | PRN

## 2013-01-22 MED ORDER — MONTELUKAST SODIUM 10 MG PO TABS: 10.0000 mg | ORAL_TABLET | Freq: Every day | ORAL | Status: DC

## 2013-01-22 NOTE — Progress Notes (Signed)
1440 Discharge instructions reviewed with patient and mother. Verbalize and understands. Left floor in wheelchair accompanied by staff and mother. Skin WNL

## 2013-01-22 NOTE — Discharge Summary (Signed)
Physician Discharge Summary  [name]:[id number] DOB: 06-May-1991 DOA: 01/17/2013  PCP: Arlyss Queen  Admit date: 01/17/2013 Discharge date: 01/22/2013  Time spent: 45  minutes  Recommendations for Outpatient Follow-up:  EUS on Wednesday 3/12 at [facility] - to be confirmed by Jarold Song Consider GYN follow up for Ovarian cysts if they remain bothersome. Repeat pelvic u/s in 4-6 weeks.  Discharge Diagnoses:  Active Problems:   Abdominal pain   Nausea vomiting and diarrhea   SOB (shortness of breath)   Discharge Condition: Stable, pain much reduced, still with some nausea.  Able to tolerate full liquid diet.  Diet recommendation: full liquids.  Advance to solids as tolerated.  Filed Weights   01/20/13 0521 01/21/13 0558 01/22/13 0500  Weight: 65 kg (143 lb 4.8 oz) 66.5 kg (146 lb 9.7 oz) 62.188 kg (137 lb 1.6 oz)    History of present illness at the time of admission:  Valerie Cortez is a 22 y.o. female who had gallstone pancreatitis and underwent laparoscopic cholecystectomy in December 02, 2012. At that time she also had ERCP and sphincterotomy. Presents with complaints of persistent abdominal pain which has been gradually worsening. The pain is located mostly in the epigastric area with nausea vomiting and unable to keep anything down. Patient also has been having watery diarrhea for last few weeks since surgery. She had gone to the ER last week and has had CT abdomen and pelvis which was unremarkable except for possible pneumonia and patient was prescribed antibiotics (levaquin) which she has taken the last 5 days. At this time patient has come with persistent pain and gastroenterologist on call for Dr. Madilyn Fireman consulted.    Hospital Course:  Nausea / Vomiting / Abdominal Pain  Had C-section on 10/04/2012, cholecystectomy on 12/02/2012 and ERCP on 12/04/2012. [facility] followed the patient throughout her admission.  Dr. Madilyn Fireman initially ordered a hepatobiliary scan which proved  negative for bile leak. In followup a CT enterography was done on March 7, this was also negative with the exception of possible, mild thickening of the ACE ending colon. Pelvic ultrasound 3/8 is normal with the exception of a 2.5 x 1.7 x 2.0 cm dominant follicle or cyst with hyperemia. This was felt to be a corpus luteal cyst. Gastroenterology's  next step in her evaluation is to consider a possible endoscopic ultrasound as an outpatient.  Currently, we do not have a diagnosis or an etiology for her pain.  At this point the patient's  pain is improved. She is tolerating full liquids. Her primary complaint is nausea rather than pain. She feels the narcotic pain medication makes her worse. She requests discharge to home, with followup as an outpatient.  Diarrhea  She had diarrhea prior to admission. C. difficile PCR could not be completed as her stool was to solid.  During her hospitalization her diarrhea recurred somewhat. This was attributed to Reglan she was taking for nausea. Stool cultures obtained on March 6 were negative for Salmonella, Shigella, Campylobacter, your sitting up or E. coli. She had reduced normal flora.  Recent Pneumonia  Took Levaquin OP for 5 days. Chest xray negative. Patient not symptomatic.  no further antibiotic therapy..  Hypokalemia  Likely secondary to IV fluids without potassium supplementation, repleted orally  Consultations:  [facility], Drs Madilyn Fireman and Randa Evens  Discharge Exam: Filed Vitals:   01/21/13 1449 01/21/13 2053 01/22/13 0500 01/22/13 0521  BP: 102/58 113/73  101/62  Pulse: 73 65  74  Temp: 97.5  F (36.4 C) 97.6 F (36.4 C)  98 F (36.7 C)  TempSrc: Oral Oral  Oral  Resp: 18 18  17   Height:      Weight:   62.188 kg (137 lb 1.6 oz)   SpO2: 97% 96%  96%    General: A&O, NAD, Sitting up in bed anxious for lunch to come. Cardiovascular: RRR no M/R/G Respiratory: CTA, no W/C/R, no accessory muscle movement Abdomen:  Soft, mild  tenderness to palpation just left of epigastrum, and in pelvic area. Skin:  No rashes, bruises, or lesions Psych:  A&O, Cooperative, Appropriate.   Mother seems very involved she has many questions about insignificant findings / test results.  Discharge Instructions      Discharge Orders   Future Appointments Andriy Sherk Department Dept Phone   02/06/2013 9:30 AM Romie Levee, MD [facility], [place] [phone]   Future Orders Complete By Expires     Diet - low sodium heart healthy  As directed     Increase activity slowly  As directed         Medication List    STOP taking these medications       doxycycline 100 MG capsule  Commonly known as:  VIBRAMYCIN     MUCINEX DM 30-600 MG Tb12      TAKE these medications       albuterol 108 (90 BASE) MCG/ACT inhaler  Commonly known as:  PROVENTIL HFA;VENTOLIN HFA  Inhale 2 puffs into the lungs every 6 (six) hours as needed. For shortness of breath     ALPRAZolam 0.5 MG tablet  Commonly known as:  XANAX  Take 0.5 mg by mouth at bedtime as needed for sleep. Anxiety     escitalopram 10 MG tablet  Commonly known as:  LEXAPRO  Take 10 mg by mouth daily.     HYDROcodone-acetaminophen 5-325 MG per tablet  Commonly known as:  NORCO/VICODIN  Take 1 tablet by mouth every 6 (six) hours as needed for pain. For pain     montelukast 10 MG tablet  Commonly known as:  SINGULAIR  Take 1 tablet (10 mg total) by mouth at bedtime.     ondansetron 4 MG tablet  Commonly known as:  ZOFRAN  Take 1 tablet (4 mg total) by mouth every 6 (six) hours as needed for nausea.       Follow-up Information   Follow up with Freddy Jaksch, MD. (Dr. Hulen Shouts Office will call you regarding the EUS Wednesday at [facility])    Contact information:   [address] [place] [address] [phone]       Follow up with CONROY,NATHAN, PA-C. Schedule an appointment as soon as possible for a visit in 2 weeks.   Contact information:    [facility] [address] [place] [address] [phone]       Please follow up. (Consider GYN follow up for Corpus Luteal Cyst. Recommend repeat pelvic u/s in 4-6 weeks)        The results of significant diagnostics from this hospitalization (including imaging, microbiology, ancillary and laboratory) are listed below for reference.    Significant Diagnostic Studies: Dg Chest 2 View  01/17/2013  *RADIOLOGY REPORT*  Clinical Data: Cough, concern for pneumonia  CHEST - 2 VIEW  Comparison: None.  Findings: Normal mediastinum and cardiac silhouette.  Normal pulmonary  vasculature.  No evidence of effusion, infiltrate, or pneumothorax.  No acute bony abnormality. The the ground-glass opacities in the right  lower lobe on comparison CT are not evident and may be occult by plain film radiography.  IMPRESSION: No acute cardiopulmonary process.   Original Report Authenticated By: Genevive Bi, M.D.    Dg Chest 2 View  01/09/2013  *RADIOLOGY REPORT*  Clinical Data: Cough, shortness of breath, and midline chest pain.  CHEST - 2 VIEW  Comparison: None.  Findings: Heart size and pulmonary vascularity are normal and the lungs are clear.  No effusions.  No pneumothorax.  No osseous abnormality.  IMPRESSION: Normal exam.   Original Report Authenticated By: Francene Boyers, M.D.    Nm Hepatobiliary  01/18/2013  *RADIOLOGY REPORT*  Clinical Data:  Abdominal pain following cholecystectomy on 12/11/2012.  Concern for bile leak or biliary obstruction.  NUCLEAR MEDICINE HEPATOBILIARY IMAGING  Technique:  Sequential images of the abdomen were obtained out to 60 minutes following intravenous administration of radiopharmaceutical.  Radiopharmaceutical:  5.65mCi Tc-79m Choletec  Comparison:  CT scan 01/11/2013  Findings: There is uniform uptake of radiotracer within the liver. Counts are present within the duodenum by 35 minutes indicating patent common bile duct.  Counts continue to flow into the small bowel  over the course of the study.  There is no evidence of biliary leak or biloma.  IMPRESSION:  1.  No evidence of bile leak.  2.  Patent common bile duct.   Original Report Authenticated By: Genevive Bi, M.D.    US Transvaginal Non-ob  01/20/2013  *RADIOLOGY REPORT*  Clinical Data: Pelvic pain.  TRANSABDOMINAL AND TRANSVAGINAL ULTRASOUND OF PELVIS Technique:  Both transabdominal and transvaginal ultrasound examinations of the pelvis were performed. Transabdominal technique was performed for global imaging of the pelvis including uterus, ovaries, adnexal regions, and pelvic cul-de-sac.  It was necessary to proceed with endovaginal exam following the transabdominal exam to visualize the uterus, ovaries, and adnexa  .  Comparison:  CT of 1 day prior  Findings:  Uterus: Normal in size and appearance  Endometrium: Normal, 11 mm.  Right ovary:  4.3 x 2.3 x 2.6 cm.  A dominant follicle or cyst measures 2.5 x 1.7 x 2.0 cm.  This does have peripheral hyperemia, including on image 47.  Left ovary: 3.7 x 1.9 x 2.7 cm. Normal in morphology.  Other findings: No free fluid  IMPRESSION: Right ovarian dominant follicle or cyst with suggestion of peripheral hyperemia.  This therefore is suspicious for a corpus luteal cyst.  Otherwise, normal appearance of the pelvis, without explanation for pelvic pain.   Original Report Authenticated By: Jeronimo Greaves, M.D.    US Pelvis Complete  01/20/2013  *RADIOLOGY REPORT*  Clinical Data: Pelvic pain.  TRANSABDOMINAL AND TRANSVAGINAL ULTRASOUND OF PELVIS Technique:  Both transabdominal and transvaginal ultrasound examinations of the pelvis were performed. Transabdominal technique was performed for global imaging of the pelvis including uterus, ovaries, adnexal regions, and pelvic cul-de-sac.  It was necessary to proceed with endovaginal exam following the transabdominal exam to visualize the uterus, ovaries, and adnexa  .  Comparison:  CT of 1 day prior  Findings:  Uterus: Normal in size  and appearance  Endometrium: Normal, 11 mm.  Right ovary:  4.3 x 2.3 x 2.6 cm.  A dominant follicle or cyst measures 2.5 x 1.7 x 2.0 cm.  This does have peripheral hyperemia, including on image 47.  Left ovary: 3.7 x 1.9 x 2.7 cm. Normal in morphology.  Other findings: No free fluid  IMPRESSION: Right ovarian dominant follicle or cyst with suggestion of peripheral hyperemia.  This therefore is  suspicious for a corpus luteal cyst.  Otherwise, normal appearance of the pelvis, without explanation for pelvic pain.   Original Report Authenticated By: Jeronimo Greaves, M.D.    Ct Abdomen Pelvis W Contrast  01/11/2013  *RADIOLOGY REPORT*  Clinical Data: 22 year old female with abdominal and pelvic pain. History of cholecystectomy and elevated lipase.  CT ABDOMEN AND PELVIS WITH CONTRAST  Technique:  Multidetector CT imaging of the abdomen and pelvis was performed following the standard protocol during bolus administration of intravenous contrast.  Contrast:  100 ml intravenous Omnipaque-300  Comparison: 11/30/2012 MRCP  Findings: Patchy ground-glass opacities within the lung bases, right greater than left are noted and may represent inflammation or infection.  The liver, spleen, pancreas, adrenal glands and kidneys are unremarkable. The patient is status post cholecystectomy.  No free fluid, enlarged lymph nodes, biliary dilation or abdominal aortic aneurysm identified. The uterus and adnexal regions are within normal limits. The bowel, bladder and appendix are unremarkable.  No acute or suspicious bony abnormalities are identified.  IMPRESSION: Patchy ground-glass opacities within both lung bases - question infection/pneumonia or nonspecific inflammation.  No evidence of acute abnormality within the abdomen or pelvis.   Original Report Authenticated By: Harmon Pier, M.D.    Ct Entero Abd/pelvis W/cm  01/19/2013  *RADIOLOGY REPORT*  Clinical Data:  Abdominal pain, nausea/vomiting/diarrhea, status post cholecystectomy,  recent C-section, recent pancreatitis  CT ABDOMEN AND PELVIS WITH CONTRAST (CT ENTEROGRAPHY)  Technique:  Multidetector CT of the abdomen and pelvis during bolus administration of intravenous contrast. Negative oral contrast VoLumen was given.  Contrast:  100 ml Omnipaque-300 IV  Comparison:  01/11/2013  Findings: Lung bases are clear.  Liver, spleen, pancreas, and adrenal glands are within normal limits.  Status post cholecystectomy.  No intrahepatic or extrahepatic ductal dilatation.  Kidneys are within normal limits.  No hydronephrosis.  No evidence of bowel obstruction.  Normal appendix.  Possible mild wall thickening involving the ascending colon (coronal image 47). Left colon is decompressed.  No evidence of abdominal aortic aneurysm.  Retroaortic left renal vein.  No suspicious abdominopelvic lymphadenopathy.  Uterus and bilateral ovaries are unremarkable.  Small volume pelvic ascites, likely physiologic.  Bladder is within normal limits.  Visualized osseous structures are within normal limits.  IMPRESSION: Possible mild wall thickening involving the ascending colon, correlate for infectious/inflammatory colitis.  No evidence of bowel obstruction.  Normal appendix.   Original Report Authenticated By: Charline Bills, M.D.     Microbiology: Recent Results (from the past 240 hour(s))  STOOL CULTURE     Status: None   Collection Time    01/18/13  2:14 AM      Result Value Range Status   Specimen Description STOOL   Final   Special Requests NONE   Final   Culture     Final   Value: NO SALMONELLA, SHIGELLA, CAMPYLOBACTER, YERSINIA, OR E.COLI 0157:H7 ISOLATED     Note: REDUCED NORMAL FLORA PRESENT   Report Status 01/21/2013 FINAL   Final     Labs: Basic Metabolic Panel:  Recent Labs Lab 01/17/13 1544 01/18/13 0620 01/19/13 0653 01/22/13 0730  NA 140 141 138 140  K 3.8 4.1 3.2* 3.4*  CL 107 107 104 105  CO2  --  24 24 26   GLUCOSE 86 87 78 91  BUN <3* 5* 4* <3*  CREATININE 0.60 0.72  0.67 0.65  CALCIUM  --  9.2 8.8 9.0   Liver Function Tests:  Recent Labs Lab 01/17/13 1821 01/18/13 0620 01/19/13 1610  01/22/13 0730  AST 29 17 17 23   ALT 27 21 20 24   ALKPHOS 146* 124* 109 112  BILITOT 0.3 0.3 0.3 0.3  PROT 8.0 6.3 5.9* 6.2  ALBUMIN 4.3 3.5 3.1* 3.4*    Recent Labs Lab 01/17/13 1500 01/18/13 0620 01/19/13 0653 01/22/13 0730  LIPASE 72* 53 48 50   CBC:  Recent Labs Lab 01/17/13 1544 01/17/13 1640 01/18/13 0620  WBC  --  7.5 9.1  NEUTROABS  --  4.0  --   HGB 14.3 14.0 12.1  HCT 42.0 39.8 35.9*  MCV  --  73.0* 76.1*  PLT  --  234 201   CBG:  Recent Labs Lab 01/21/13 1211 01/21/13 1706 01/22/13 0012 01/22/13 0528 01/22/13 1215  GLUCAP 89 99 129* 95 111*       SignedConley Canal [phone]  [facility] 01/22/2013, 1:52 PM

## 2013-01-22 NOTE — Progress Notes (Signed)
[facility] PROGRESS NOTE Subjective: Patient continues to complain of nausea and vomiting. She states that there's no change in her symptoms. She has had extensive testing is all come back negative. Pelvic ultrasound did show corpus luteum cyst. She states that her discomfort is in or upper abdomen. Her main complaint however, is nausea.  Objective: Vital signs in last 24 hours: Temp:  [97.5 F (36.4 C)-98 F (36.7 C)] 98 F (36.7 C) (03/10 0521) Pulse Rate:  [65-74] 74 (03/10 0521) Resp:  [17-18] 17 (03/10 0521) BP: (101-113)/(58-73) 101/62 mmHg (03/10 0521) SpO2:  [96 %-97 %] 96 % (03/10 0521) Weight:  [62.188 kg (137 lb 1.6 oz)] 62.188 kg (137 lb 1.6 oz) (03/10 0500) Last BM Date: 01/21/13  Intake/Output from previous day: 03/09 0701 - 03/10 0700 In: 1108.8 [P.O.:240; I.V.:868.8] Out: -  Intake/Output this shift:    PE: Gen. -- patient no obvious distress Abdomen -- none distended softwood good bowel sounds with minimal tenderness.  Lab Results: No results found for this basename: WBC, HGB, HCT, PLT,  in the last 72 hours BMET No results found for this basename: NA, K, CL, CO2, CREATININE,  in the last 72 hours LFT No results found for this basename: PROT, AST, ALT, ALKPHOS, BILITOT, BILIDIR, IBILI,  in the last 72 hours PT/INR No results found for this basename: LABPROT, INR,  in the last 72 hours PANCREAS No results found for this basename: LIPASE,  in the last 72 hours       Studies/Results: US Transvaginal Non-ob  01/20/2013  *RADIOLOGY REPORT*  Clinical Data: Pelvic pain.  TRANSABDOMINAL AND TRANSVAGINAL ULTRASOUND OF PELVIS Technique:  Both transabdominal and transvaginal ultrasound examinations of the pelvis were performed. Transabdominal technique was performed for global imaging of the pelvis including uterus, ovaries, adnexal regions, and pelvic cul-de-sac.  It was necessary to proceed with endovaginal exam following the transabdominal exam to  visualize the uterus, ovaries, and adnexa  .  Comparison:  CT of 1 day prior  Findings:  Uterus: Normal in size and appearance  Endometrium: Normal, 11 mm.  Right ovary:  4.3 x 2.3 x 2.6 cm.  A dominant follicle or cyst measures 2.5 x 1.7 x 2.0 cm.  This does have peripheral hyperemia, including on image 47.  Left ovary: 3.7 x 1.9 x 2.7 cm. Normal in morphology.  Other findings: No free fluid  IMPRESSION: Right ovarian dominant follicle or cyst with suggestion of peripheral hyperemia.  This therefore is suspicious for a corpus luteal cyst.  Otherwise, normal appearance of the pelvis, without explanation for pelvic pain.   Original Report Authenticated By: Jeronimo Greaves, M.D.    US Pelvis Complete  01/20/2013  *RADIOLOGY REPORT*  Clinical Data: Pelvic pain.  TRANSABDOMINAL AND TRANSVAGINAL ULTRASOUND OF PELVIS Technique:  Both transabdominal and transvaginal ultrasound examinations of the pelvis were performed. Transabdominal technique was performed for global imaging of the pelvis including uterus, ovaries, adnexal regions, and pelvic cul-de-sac.  It was necessary to proceed with endovaginal exam following the transabdominal exam to visualize the uterus, ovaries, and adnexa  .  Comparison:  CT of 1 day prior  Findings:  Uterus: Normal in size and appearance  Endometrium: Normal, 11 mm.  Right ovary:  4.3 x 2.3 x 2.6 cm.  A dominant follicle or cyst measures 2.5 x 1.7 x 2.0 cm.  This does have peripheral hyperemia, including on image 47.  Left ovary: 3.7 x 1.9 x 2.7 cm. Normal in morphology.  Other findings: No free fluid  IMPRESSION: Right ovarian dominant follicle or cyst with suggestion of peripheral hyperemia.  This therefore is suspicious for a corpus luteal cyst.  Otherwise, normal appearance of the pelvis, without explanation for pelvic pain.   Original Report Authenticated By: Jeronimo Greaves, M.D.     Medications: I have reviewed the patient's current medications.  Assessment/Plan: 1. Abdominal  pain/nausea. Patient has had extensive workup has been negative including cholecystectomy and ERCP with empiric sphincterotomy. None of this is really helped. When discussing this with she and her mother it appears that she had been doing reasonably well on Zofran. She is currently being evaluated for possible EUS later in the week. If she is able to tolerate her diet and controller symptoms with oral medications, she could go home and have the procedure done outpatient   Jeweline Reif JR,Laresa Oshiro L 01/22/2013, 8:40 AM

## 2013-01-22 NOTE — Discharge Summary (Signed)
Addendum  Patient seen and examined, chart and data base reviewed.  I agree with the above assessment and discharge plan.  For full details please see Mrs. Algis Downs PA note.  Nausea/vomiting/abdominal pain, unexplained by multiple tests.  Patient for consideration of endoscopic ultrasound by [facility].   Clint Lipps, MD [facility] Pager: [phone] 01/22/2013, 3:53 PM

## 2013-01-23 ENCOUNTER — Encounter (HOSPITAL_COMMUNITY): Payer: Self-pay | Admitting: Pharmacy Technician

## 2013-01-24 ENCOUNTER — Encounter (HOSPITAL_COMMUNITY)
Admission: RE | Disposition: A | Discharge status: Home / Self Care | Payer: Self-pay | Source: Ambulatory Visit | Attending: Gastroenterology

## 2013-01-24 ENCOUNTER — Encounter (HOSPITAL_COMMUNITY): Payer: Self-pay | Admitting: Anesthesiology

## 2013-01-24 ENCOUNTER — Ambulatory Visit (HOSPITAL_COMMUNITY): Payer: BC Managed Care – PPO | Admitting: Anesthesiology

## 2013-01-24 ENCOUNTER — Encounter (HOSPITAL_COMMUNITY): Payer: Self-pay | Admitting: *Deleted

## 2013-01-24 ENCOUNTER — Ambulatory Visit (HOSPITAL_COMMUNITY)
Admission: RE | Admit: 2013-01-24 | Discharge: 2013-01-24 | Disposition: A | Discharge status: Home / Self Care | Payer: BC Managed Care – PPO | Source: Ambulatory Visit | Attending: Gastroenterology | Admitting: Gastroenterology

## 2013-01-24 DIAGNOSIS — R197 Diarrhea, unspecified: Secondary | ICD-10-CM | POA: Insufficient documentation

## 2013-01-24 DIAGNOSIS — R112 Nausea with vomiting, unspecified: Secondary | ICD-10-CM | POA: Insufficient documentation

## 2013-01-24 DIAGNOSIS — Z79899 Other long term (current) drug therapy: Secondary | ICD-10-CM | POA: Insufficient documentation

## 2013-01-24 DIAGNOSIS — R1013 Epigastric pain: Secondary | ICD-10-CM | POA: Insufficient documentation

## 2013-01-24 DIAGNOSIS — R0602 Shortness of breath: Secondary | ICD-10-CM | POA: Insufficient documentation

## 2013-01-24 DIAGNOSIS — Z9089 Acquired absence of other organs: Secondary | ICD-10-CM | POA: Insufficient documentation

## 2013-01-24 DIAGNOSIS — R7989 Other specified abnormal findings of blood chemistry: Secondary | ICD-10-CM | POA: Insufficient documentation

## 2013-01-24 HISTORY — PX: EUS: SHX5427

## 2013-01-24 HISTORY — DX: Hypotension, unspecified: I95.9

## 2013-01-24 SURGERY — ESOPHAGEAL ENDOSCOPIC ULTRASOUND (EUS) RADIAL
Anesthesia: Monitor Anesthesia Care

## 2013-01-24 MED ORDER — KETAMINE HCL 10 MG/ML IJ SOLN
INTRAMUSCULAR | Status: DC | PRN
  Administered 2013-01-24 (×2): 20 mg via INTRAVENOUS
  Administered 2013-01-24: 10 mg via INTRAVENOUS

## 2013-01-24 MED ORDER — PROPOFOL 10 MG/ML IV EMUL
INTRAVENOUS | Status: DC | PRN
  Administered 2013-01-24: 160 ug/kg/min via INTRAVENOUS

## 2013-01-24 MED ORDER — SODIUM CHLORIDE 0.9 % IV SOLN: INTRAVENOUS | Status: DC

## 2013-01-24 MED ORDER — MIDAZOLAM HCL 5 MG/5ML IJ SOLN
INTRAMUSCULAR | Status: DC | PRN
  Administered 2013-01-24: 2 mg via INTRAVENOUS

## 2013-01-24 MED ORDER — LACTATED RINGERS IV SOLN
INTRAVENOUS | Status: DC | PRN
  Administered 2013-01-24: 11:00:00 via INTRAVENOUS

## 2013-01-24 MED ORDER — BUTAMBEN-TETRACAINE-BENZOCAINE 2-2-14 % EX AERO
INHALATION_SPRAY | CUTANEOUS | Status: DC | PRN
  Administered 2013-01-24: 2 via TOPICAL

## 2013-01-24 MED ORDER — FENTANYL CITRATE 0.05 MG/ML IJ SOLN
INTRAMUSCULAR | Status: DC | PRN
  Administered 2013-01-24 (×2): 50 ug via INTRAVENOUS

## 2013-01-24 NOTE — Anesthesia Preprocedure Evaluation (Signed)
Anesthesia Evaluation  Patient identified by MRN, date of birth, ID band Patient awake    Reviewed: Allergy & Precautions, H&P , NPO status , Patient's Chart, lab work & pertinent test results  Airway Mallampati: III TM Distance: <3 FB Neck ROM: Full  Mouth opening: Limited Mouth Opening  Dental no notable dental hx.    Pulmonary neg pulmonary ROS,  breath sounds clear to auscultation  Pulmonary exam normal       Cardiovascular negative cardio ROS  Rhythm:Regular Rate:Normal     Neuro/Psych negative neurological ROS  negative psych ROS   GI/Hepatic negative GI ROS, Neg liver ROS,   Endo/Other  negative endocrine ROS  Renal/GU negative Renal ROS  negative genitourinary   Musculoskeletal negative musculoskeletal ROS (+)   Abdominal   Peds negative pediatric ROS (+)  Hematology negative hematology ROS (+)   Anesthesia Other Findings   Reproductive/Obstetrics negative OB ROS                           Anesthesia Physical Anesthesia Plan  ASA: II  Anesthesia Plan: MAC   Post-op Pain Management:    Induction: Intravenous  Airway Management Planned: Nasal Cannula  Additional Equipment:   Intra-op Plan:   Post-operative Plan:   Informed Consent: I have reviewed the patients History and Physical, chart, labs and discussed the procedure including the risks, benefits and alternatives for the proposed anesthesia with the patient or authorized representative who has indicated his/her understanding and acceptance.     Plan Discussed with: CRNA and Surgeon  Anesthesia Plan Comments:         Anesthesia Quick Evaluation

## 2013-01-24 NOTE — Anesthesia Postprocedure Evaluation (Signed)
  Anesthesia Post-op Note  Patient: Valerie Cortez  Procedure(s) Performed: Procedure(s) (LRB): ESOPHAGEAL ENDOSCOPIC ULTRASOUND (EUS) RADIAL (N/A)  Patient Location: PACU  Anesthesia Type: MAC  Level of Consciousness: awake and alert   Airway and Oxygen Therapy: Patient Spontanous Breathing  Post-op Pain: mild  Post-op Assessment: Post-op Vital signs reviewed, Patient's Cardiovascular Status Stable, Respiratory Function Stable, Patent Airway and No signs of Nausea or vomiting  Last Vitals:  Filed Vitals:   01/24/13 1237  BP: 100/27  Temp:   Resp: 18    Post-op Vital Signs: stable   Complications: No apparent anesthesia complications

## 2013-01-24 NOTE — Interval H&P Note (Signed)
History and Physical Interval Note:  01/24/2013 12:00 PM  Valerie Cortez  has presented today for surgery, with the diagnosis of abd.pain/hx of pancreatitis  The various methods of treatment have been discussed with the patient and family. After consideration of risks, benefits and other options for treatment, the patient has consented to  Procedure(s): ESOPHAGEAL ENDOSCOPIC ULTRASOUND (EUS) RADIAL (N/A) as a surgical intervention .  The patient's history has been reviewed, patient examined, no change in status, stable for surgery.  I have reviewed the patient's chart and labs.  Questions were answered to the patient's satisfaction.     Jalik Gellatly M  Assessment:  1.  Abdominal pain. 2.  Cyclically elevated LFTs.  Plan:  1.  Endoscopic ultrasound. 2.  Risks (bleeding, infection, bowel perforation that could require surgery, sedation-related changes in cardiopulmonary systems), benefits (identification and possible treatment of source of symptoms, exclusion of certain causes of symptoms), and alternatives (watchful waiting, radiographic imaging studies, empiric medical treatment) of upper endoscopy with ultrasound (EUS) were explained to patient/family in detail and patient wishes to proceed.

## 2013-01-24 NOTE — Transfer of Care (Signed)
Immediate Anesthesia Transfer of Care Note  Patient: Valerie Cortez  Procedure(s) Performed: Procedure(s): ESOPHAGEAL ENDOSCOPIC ULTRASOUND (EUS) RADIAL (N/A)  Patient Location: PACU  Anesthesia Type:MAC  Level of Consciousness: awake, alert  and oriented  Airway & Oxygen Therapy: Patient Spontanous Breathing and Patient connected to nasal cannula oxygen  Post-op Assessment: Report given to PACU RN and Post -op Vital signs reviewed and stable  Post vital signs: Reviewed and stable  Complications: No apparent anesthesia complications

## 2013-01-24 NOTE — Op Note (Signed)
[facility] [address] [place], [address]   ENDOSCOPIC ULTRASOUND PROCEDURE REPORT  PATIENT: Valerie Cortez, Valerie Cortez  MR#: [id number] BIRTHDATE: 07/20/1991  GENDER: Female ENDOSCOPIST: Willis Modena, MD REFERRED BY:  Lonie Peak, PA Dorena Cookey, M.D. PROCEDURE DATE:  01/24/2013 PROCEDURE:   Upper EUS ASA CLASS:      Class II INDICATIONS:   1.  abdominal pain, elevated LFTs. MEDICATIONS: Cetacaine spray x 2 and MAC sedation, administered by CRNA  DESCRIPTION OF PROCEDURE:   After the risks benefits and alternatives of the procedure were  explained, informed consent was obtained. The patient was then placed in the left, lateral, decubitus postion and IV sedation was administered. Throughout the procedure, the patients blood pressure, pulse and oxygen saturations were monitored continuously.  Under direct visualization, the Pentax Radial EUS [id number]  endoscope was introduced through the mouth  and advanced to the second portion of the duodenum .  Water was used as necessary to provide an acoustic interface.  Upon completion of the imaging, water was removed and the patient was sent to the recovery room in satisfactory condition.    FINDINGS:      Pancreatic parenchyma a bit heterogeneous, possibly from prior pancreatitis; criteria for chronic pancreatitis were not met.  No pancreatic mass or cyst seen.  No peripancreatic edema or adenopathy.  Extrahepatic biliary tree non-dilated; no bile duct wall thickening and no choledocholithiasis.  Post-cholecystectomy. Ampulla normal endoscopically and via endoscopic ultrasound.  IMPRESSION:     As above.  No explanation for patient's symptoms identified.  RECOMMENDATIONS:     1.  Watch for potential complications of procedure. 2.  Considerations of colonoscopy, at discretion of Dr. Madilyn Fireman for hematochezia; she has mild ascending colon thickening on recent CT scan. 3.  Will discuss case with Dr.  Madilyn Fireman.    _______________________________ Rosalie DoctorWillis Modena, MD 01/24/2013 12:35 PM   CC:

## 2013-01-24 NOTE — H&P (View-Only) (Signed)
[facility] History and Physical  Valerie Cortez MRN:[id number] DOB: 01/13/1991 DOA: 01/17/2013  Referring physician: ER Physician. PCP: CONROY,NATHAN, PA-C  Specialists: Dr.Hayes gastroenterologist.  Chief Complaint: Abdominal pain.  HPI: Valerie Cortez is a 21 y.o. female has had gallstone pancreatitis and has undergone laparoscopic cholecystectomy in January 2 months ago and at that time had also undergone ERCP and sphincterotomy presents with complaints of persistent abdominal pain which has been gradually worsening. The pain is located mostly in the epigastric area with nausea vomiting and unable to keep anything in. Patient also has been having watery diarrhea for last few weeks since surgery. She had gone to the ER last week and has had CT abdomen and pelvis which was unremarkable except for possible pneumonia and patient was prescribed antibiotics which she has taken the last 5 days. At this time patient has come with persistent pain and gastroenterologist on call for Dr. Hayes, Dr. Buccini was consulted by the ED physician and at this time they will be seeing patient in consult for possible EUS. Patient will be admitted for further management. Patient states in addition she's been having mild shortness of breath with exertion but at rest and on my exam patient is markedly short of breath and is not hypoxic. Denies any chest pain fever chills but does have weakness.  Review of Systems: As presented in the history of presenting illness nothing else significant.  Past Medical History  Diagnosis Date  . Constipation     treated with diet change  . Headache     migraines  . Hx of chlamydia infection   . Difficult intubation   . Pancreatitis    Past Surgical History  Procedure Laterality Date  . Wisdom tooth extraction    . Cesarean section  10/04/2012    Procedure: CESAREAN SECTION;  Surgeon: Thomas F Henley, MD;  Location: [facility];  Service: Obstetrics;  Laterality: N/A;  .  Cholecystectomy  12/02/2012    Procedure: LAPAROSCOPIC CHOLECYSTECTOMY WITH INTRAOPERATIVE CHOLANGIOGRAM;  Surgeon: Alicia Thomas, MD;  Location: [facility];  Service: General;  Laterality: N/A;  . Ercp  12/04/2012    Procedure: ENDOSCOPIC RETROGRADE CHOLANGIOPANCREATOGRAPHY (ERCP);  Surgeon: John C Hayes, MD;  Location: [facility];  Service: Endoscopy;  Laterality: N/A;  . Ercp  12/05/2012    Procedure: ENDOSCOPIC RETROGRADE CHOLANGIOPANCREATOGRAPHY (ERCP);  Surgeon: John C Hayes, MD;  Location: [facility];  Service: Endoscopy;  Laterality: N/A;   Social History:  reports that she has never smoked. She has never used smokeless tobacco. She reports that she does not drink alcohol or use illicit drugs. Lives at home. where does patient live--home, ALF, SNF? and with whom if at home? Can do ADLs. Can patient participate in ADLs?  Allergies  Allergen Reactions  . Fentanyl Itching, Rash and Other (See Comments)    "flushed" feeling  . Oxycodone     Rash and Hives  . Peanut-Containing Drug Products Hives  . Red Dye Nausea And Vomiting    Red food coloring    Family History  Problem Relation Age of Onset  . Cerebral palsy Mother   . Fibroids Mother   . Diverticulitis Mother   . Migraines Mother   . Asthma Brother   . Cancer Maternal Grandmother     ovarian  . COPD Maternal Grandfather   . Diabetes Maternal Grandfather   . Heart disease Maternal Grandfather     heart valve  . Hypertension Maternal Grandfather   . Cancer Paternal Grandfather       lung      Prior to Admission medications   Medication Sig Start Date End Date Taking? Authorizing Provider  albuterol (PROVENTIL HFA;VENTOLIN HFA) 108 (90 BASE) MCG/ACT inhaler Inhale 2 puffs into the lungs every 6 (six) hours as needed. For shortness of breath   Yes Historical Provider, MD  ALPRAZolam (XANAX) 0.5 MG tablet Take 0.5 mg by mouth at bedtime as needed for sleep. Anxiety   Yes Historical Provider, MD  Dextromethorphan-Guaifenesin  (MUCINEX DM) 30-600 MG TB12 1 po BID prn cough 01/11/13  Yes Iva L Knapp, MD  doxycycline (VIBRAMYCIN) 100 MG capsule Take 100 mg by mouth 2 (two) times daily.   Yes Historical Provider, MD  escitalopram (LEXAPRO) 10 MG tablet Take 10 mg by mouth daily.   Yes Historical Provider, MD  HYDROcodone-acetaminophen (NORCO/VICODIN) 5-325 MG per tablet Take 1 tablet by mouth every 6 (six) hours as needed for pain. For pain 01/11/13  Yes Iva L Knapp, MD   Physical Exam: Filed Vitals:   01/17/13 1730 01/17/13 1800 01/17/13 2000 01/17/13 2100  BP: 104/62 108/82 116/74 104/77  Pulse: 60 56 58 71  Temp:      TempSrc:      Resp:      SpO2: 100% 100% 100% 98%     General:  Well-developed and nourished.  Eyes: Anicteric no pallor.  ENT: No discharge from the ears eyes nose and mouth.  Neck: No mass felt.  Cardiovascular: S1-S2 heard.  Respiratory: No rhonchi no crepitations.  Abdomen: Soft mild tenderness in the epigastric area.  Skin: No rash.  Musculoskeletal: No edema. No effusion in the joints.  Psychiatric: Appears normal.  Neurologic: Moves all extremities.  Labs on Admission:  Basic Metabolic Panel:  Recent Labs Lab 01/11/13 1801 01/17/13 1544  NA 135 140  K 3.4* 3.8  CL 100 107  CO2 22  --   GLUCOSE 103* 86  BUN 10 <3*  CREATININE 0.53 0.60  CALCIUM 9.3  --    Liver Function Tests:  Recent Labs Lab 01/11/13 1801 01/17/13 1821  AST 15 29  ALT 22 27  ALKPHOS 145* 146*  BILITOT 0.3 0.3  PROT 7.8 8.0  ALBUMIN 4.2 4.3    Recent Labs Lab 01/11/13 1801 01/17/13 1500  LIPASE 63* 72*   No results found for this basename: AMMONIA,  in the last 168 hours CBC:  Recent Labs Lab 01/11/13 1801 01/17/13 1544 01/17/13 1640  WBC 9.2  --  7.5  NEUTROABS 6.5  --  4.0  HGB 12.5 14.3 14.0  HCT 37.6 42.0 39.8  MCV 76.6*  --  73.0*  PLT 212  --  234   Cardiac Enzymes: No results found for this basename: CKTOTAL, CKMB, CKMBINDEX, TROPONINI,  in the last 168  hours  BNP (last 3 results) No results found for this basename: PROBNP,  in the last 8760 hours CBG: No results found for this basename: GLUCAP,  in the last 168 hours  Radiological Exams on Admission: No results found.   Assessment/Plan Active Problems:   Abdominal pain   Nausea vomiting and diarrhea   SOB (shortness of breath)   1. Abdominal pain with nausea vomiting and diarrhea in a patient who has had recent laparoscopic cholecystectomy for gallstone pancreatitis with ERCP and sphincterotomy - labs reveal only mildly elevated alkaline phosphatase. Patient will be kept in view in anticipation of possible procedure in a.m. by gastroenterologist. Continue with pain relief medications. Hydration. 2. Shortness of breath - patient was just   recently treated for possible pneumonia. On exam patient is not in distress and there is no wheezing or crepitations. I have ordered a chest x-ray which has to be followed.  Dr. Buccini was consulted. if consultant consulted, please document name and whether formally or informally consulted  Code Status: Full code.  Family Communication: Discussed with patient's husband at the bedside.  Disposition Plan: Admit to inpatient.  Valerie Manfredonia N. [facility] Pager [phone].  If 7PM-7AM, please contact night-coverage [email or web address] Password [email or web address] 01/17/2013, 9:08 PM        

## 2013-01-25 ENCOUNTER — Encounter (HOSPITAL_COMMUNITY): Payer: Self-pay | Admitting: Gastroenterology

## 2013-02-06 ENCOUNTER — Encounter (INDEPENDENT_AMBULATORY_CARE_PROVIDER_SITE_OTHER): Payer: BC Managed Care – PPO | Admitting: General Surgery

## 2013-02-27 ENCOUNTER — Encounter (INDEPENDENT_AMBULATORY_CARE_PROVIDER_SITE_OTHER): Payer: BC Managed Care – PPO | Admitting: General Surgery

## 2013-03-14 ENCOUNTER — Encounter (HOSPITAL_COMMUNITY): Payer: Self-pay | Admitting: *Deleted

## 2013-03-14 ENCOUNTER — Emergency Department (HOSPITAL_COMMUNITY)
Admission: EM | Admit: 2013-03-14 | Discharge: 2013-03-14 | Disposition: A | Discharge status: Home / Self Care | Payer: BC Managed Care – PPO | Attending: Emergency Medicine | Admitting: Emergency Medicine

## 2013-03-14 ENCOUNTER — Emergency Department (HOSPITAL_COMMUNITY): Payer: BC Managed Care – PPO

## 2013-03-14 ENCOUNTER — Telehealth: Payer: Self-pay | Admitting: Pulmonary Disease

## 2013-03-14 DIAGNOSIS — Z79899 Other long term (current) drug therapy: Secondary | ICD-10-CM | POA: Insufficient documentation

## 2013-03-14 DIAGNOSIS — R079 Chest pain, unspecified: Secondary | ICD-10-CM | POA: Insufficient documentation

## 2013-03-14 DIAGNOSIS — Z862 Personal history of diseases of the blood and blood-forming organs and certain disorders involving the immune mechanism: Secondary | ICD-10-CM | POA: Insufficient documentation

## 2013-03-14 DIAGNOSIS — R0989 Other specified symptoms and signs involving the circulatory and respiratory systems: Secondary | ICD-10-CM | POA: Insufficient documentation

## 2013-03-14 DIAGNOSIS — R0600 Dyspnea, unspecified: Secondary | ICD-10-CM

## 2013-03-14 DIAGNOSIS — R509 Fever, unspecified: Secondary | ICD-10-CM | POA: Insufficient documentation

## 2013-03-14 DIAGNOSIS — Z8719 Personal history of other diseases of the digestive system: Secondary | ICD-10-CM | POA: Insufficient documentation

## 2013-03-14 DIAGNOSIS — G43909 Migraine, unspecified, not intractable, without status migrainosus: Secondary | ICD-10-CM | POA: Insufficient documentation

## 2013-03-14 DIAGNOSIS — Z792 Long term (current) use of antibiotics: Secondary | ICD-10-CM | POA: Insufficient documentation

## 2013-03-14 DIAGNOSIS — J45909 Unspecified asthma, uncomplicated: Secondary | ICD-10-CM | POA: Insufficient documentation

## 2013-03-14 DIAGNOSIS — Z8679 Personal history of other diseases of the circulatory system: Secondary | ICD-10-CM | POA: Insufficient documentation

## 2013-03-14 DIAGNOSIS — Z8619 Personal history of other infectious and parasitic diseases: Secondary | ICD-10-CM | POA: Insufficient documentation

## 2013-03-14 DIAGNOSIS — R0609 Other forms of dyspnea: Secondary | ICD-10-CM | POA: Insufficient documentation

## 2013-03-14 LAB — COMPREHENSIVE METABOLIC PANEL
AST: 13 U/L (ref 0–37)
Alkaline Phosphatase: 99 U/L (ref 39–117)
BUN: 7 mg/dL (ref 6–23)
CO2: 29 mEq/L (ref 19–32)
Chloride: 103 mEq/L (ref 96–112)
Creatinine, Ser: 0.61 mg/dL (ref 0.50–1.10)
GFR calc non Af Amer: >90 mL/min (ref >90)
Potassium: 3.7 mEq/L (ref 3.5–5.1)
Total Bilirubin: 0.3 mg/dL (ref 0.3–1.2)

## 2013-03-14 LAB — CBC WITH DIFFERENTIAL/PLATELET
HCT: 38.1 % (ref 36.0–46.0)
Hemoglobin: 12.6 g/dL (ref 12.0–15.0)
Lymphocytes Relative: 37 % (ref 12–46)
Monocytes Absolute: 0.4 10*3/uL (ref 0.1–1.0)
Monocytes Relative: 5 % (ref 3–12)
Neutro Abs: 3.9 10*3/uL (ref 1.7–7.7)
RBC: 4.89 MIL/uL (ref 3.87–5.11)
WBC: 7 10*3/uL (ref 4.0–10.5)

## 2013-03-14 LAB — URINALYSIS, ROUTINE W REFLEX MICROSCOPIC
Hgb urine dipstick: NEGATIVE
Ketones, ur: NEGATIVE mg/dL
Protein, ur: NEGATIVE mg/dL
Urobilinogen, UA: 0.2 mg/dL (ref 0.0–1.0)

## 2013-03-14 MED ORDER — TRAMADOL HCL 50 MG PO TABS
50.0000 mg | ORAL_TABLET | Freq: Once | ORAL | Status: AC
  Administered 2013-03-14: 50 mg via ORAL
  Filled 2013-03-14: qty 1

## 2013-03-14 MED ORDER — ONDANSETRON 8 MG PO TBDP
8.0000 mg | ORAL_TABLET | Freq: Once | ORAL | Status: AC
  Administered 2013-03-14: 8 mg via ORAL
  Filled 2013-03-14: qty 1

## 2013-03-14 MED ORDER — ONDANSETRON HCL 4 MG PO TABS: 4.0000 mg | ORAL_TABLET | Freq: Four times a day (QID) | ORAL | Status: DC

## 2013-03-14 MED ORDER — HYDROMORPHONE HCL 2 MG PO TABS: 1.0000 mg | ORAL_TABLET | ORAL | Status: DC | PRN

## 2013-03-14 NOTE — ED Provider Notes (Signed)
History     CSN: [id number]  Arrival date & time 03/14/13  1740   First MD Initiated Contact with Patient 03/14/13 2103      Chief Complaint  Patient presents with  . Chest Pain    (Consider location/radiation/quality/duration/timing/severity/associated sxs/prior treatment) Patient is a 22 y.o. female presenting with chest pain. The history is provided by the patient.  Chest Pain She has been having problems with chest pain and dyspnea for the last several months. She is 5 months postpartum and had cholecystitis with cholecystectomy in the early postpartum period. She then had pancreatitis and had an ERCP. Following this, she was treated for a lung infection which does not show up on plain x-ray, but only shows up on CT scan. She received 2 courses of levofloxacin which gave very temporary improvement. She was given a course of prednisone but had to stop because she broke out in hives from it. She has been given albuterol which is slight and very temporary relief. Yesterday, she had another CT scan which showed ongoing infection in her physician gave her prescription for clarithromycin. This morning, she had a fever of 102.2. She took a dose of the clarithromycin and she states that she had severe chest pain following that. Pain is over the lower chest with radiation through to the back. Of note, she does have an appointment with pulmonology for May 13. She denies nausea or vomiting he denies constipation or diarrhea. She denies arthralgias or myalgias.  Past Medical History  Diagnosis Date  . Constipation     treated with diet change  . Headache     migraines  . Hx of chlamydia infection   . Difficult intubation   . Pancreatitis   . Asthma   . Anemia   . Hypotension     Past Surgical History  Procedure Laterality Date  . Wisdom tooth extraction    . Cesarean section  10/04/2012    Procedure: CESAREAN SECTION;  Surgeon: Bing Plume, MD;  Location: [facility];  Service:  Obstetrics;  Laterality: N/A;  . Cholecystectomy  12/02/2012    Procedure: LAPAROSCOPIC CHOLECYSTECTOMY WITH INTRAOPERATIVE CHOLANGIOGRAM;  Surgeon: Romie Levee, MD;  Location: [facility];  Service: General;  Laterality: N/A;  . Ercp  12/04/2012    Procedure: ENDOSCOPIC RETROGRADE CHOLANGIOPANCREATOGRAPHY (ERCP);  Surgeon: Barrie Folk, MD;  Location: [facility];  Service: Endoscopy;  Laterality: N/A;  . Ercp  12/05/2012    Procedure: ENDOSCOPIC RETROGRADE CHOLANGIOPANCREATOGRAPHY (ERCP);  Surgeon: Barrie Folk, MD;  Location: [facility];  Service: Endoscopy;  Laterality: N/A;  . Eus N/A 01/24/2013    Procedure: ESOPHAGEAL ENDOSCOPIC ULTRASOUND (EUS) RADIAL;  Surgeon: Willis Modena, MD;  Location: [facility];  Service: Endoscopy;  Laterality: N/A;    Family History  Problem Relation Age of Onset  . Cerebral palsy Mother   . Fibroids Mother   . Diverticulitis Mother   . Migraines Mother   . Asthma Brother   . Cancer Maternal Grandmother     ovarian  . COPD Maternal Grandfather   . Diabetes Maternal Grandfather   . Heart disease Maternal Grandfather     heart valve  . Hypertension Maternal Grandfather   . Cancer Paternal Grandfather     lung    History  Substance Use Topics  . Smoking status: Never Smoker   . Smokeless tobacco: Never Used  . Alcohol Use: No    OB History   Grav Para Term Preterm Abortions TAB SAB Ect Mult Living  1 1 1       1       Review of Systems  Cardiovascular: Positive for chest pain.  All other systems reviewed and are negative.    Allergies  Fentanyl; Benadryl; Oxycodone; Peanut-containing drug products; and Red dye  Home Medications   Current Outpatient Rx  Name  Route  Sig  Dispense  Refill  . albuterol (PROVENTIL HFA;VENTOLIN HFA) 108 (90 BASE) MCG/ACT inhaler   Inhalation   Inhale 2 puffs into the lungs every 6 (six) hours as needed. For shortness of breath         . ALPRAZolam (XANAX) 0.5 MG tablet   Oral   Take 0.5 mg by  mouth 3 (three) times daily as needed for sleep or anxiety. Anxiety         . clarithromycin (BIAXIN) 500 MG tablet   Oral   Take 500 mg by mouth 2 (two) times daily.           BP 104/65  Pulse 86  Temp(Src) 97.9 F (36.6 C) (Oral)  Resp 18  SpO2 98%  LMP 03/07/2013  Physical Exam  Nursing note and vitals reviewed.  22 year old female, resting comfortably and in no acute distress. Vital signs are normal. Oxygen saturation is 98%, which is normal. Head is normocephalic and atraumatic. PERRLA, EOMI. Oropharynx is clear. Neck is nontender and supple without adenopathy or JVD. Back is nontender and there is no CVA tenderness. Lungs are clear without rales, wheezes, or rhonchi. Chest is nontender. Heart has regular rate and rhythm without murmur. Abdomen is soft, flat, nontender without masses or hepatosplenomegaly and peristalsis is normoactive. Extremities have no cyanosis or edema, full range of motion is present. Skin is warm and dry without rash. Neurologic: Mental status is normal, cranial nerves are intact, there are no motor or sensory deficits.  ED Course  Procedures (including critical care time)  Results for orders placed during the hospital encounter of 03/14/13  CBC WITH DIFFERENTIAL      Result Value Range   WBC 7.0  4.0 - 10.5 K/uL   RBC 4.89  3.87 - 5.11 MIL/uL   Hemoglobin 12.6  12.0 - 15.0 g/dL   HCT 16.1  09.6 - 04.5 %   MCV 77.9 (*) 78.0 - 100.0 fL   MCH 25.8 (*) 26.0 - 34.0 pg   MCHC 33.1  30.0 - 36.0 g/dL   RDW 40.9  81.1 - 91.4 %   Platelets 240  150 - 400 K/uL   Neutrophils Relative 56  43 - 77 %   Neutro Abs 3.9  1.7 - 7.7 K/uL   Lymphocytes Relative 37  12 - 46 %   Lymphs Abs 2.6  0.7 - 4.0 K/uL   Monocytes Relative 5  3 - 12 %   Monocytes Absolute 0.4  0.1 - 1.0 K/uL   Eosinophils Relative 1  0 - 5 %   Eosinophils Absolute 0.1  0.0 - 0.7 K/uL   Basophils Relative 1  0 - 1 %   Basophils Absolute 0.1  0.0 - 0.1 K/uL  COMPREHENSIVE  METABOLIC PANEL      Result Value Range   Sodium 138  135 - 145 mEq/L   Potassium 3.7  3.5 - 5.1 mEq/L   Chloride 103  96 - 112 mEq/L   CO2 29  19 - 32 mEq/L   Glucose, Bld 96  70 - 99 mg/dL   BUN 7  6 - 23 mg/dL  Creatinine, Ser 0.61  0.50 - 1.10 mg/dL   Calcium 9.6  8.4 - 69.6 mg/dL   Total Protein 7.0  6.0 - 8.3 g/dL   Albumin 3.8  3.5 - 5.2 g/dL   AST 13  0 - 37 U/L   ALT 14  0 - 35 U/L   Alkaline Phosphatase 99  39 - 117 U/L   Total Bilirubin 0.3  0.3 - 1.2 mg/dL   GFR calc non Af Amer >90  >90 mL/min   GFR calc Af Amer >90  >90 mL/min   Dg Chest 2 View  03/14/2013  *RADIOLOGY REPORT*  Clinical Data: Diffuse chest pain  CHEST - 2 VIEW  Comparison: 01/17/2013  Findings: The lungs are clear without focal consolidation, edema, effusion or pneumothorax.  Cardiopericardial silhouette is within normal limits for size.  Imaged bony structures of the thorax are intact.  IMPRESSION: Stable.  Normal exam.   Original Report Authenticated By: Kennith Center, M.D.      Date: 03/14/2013  Rate: 67  Rhythm: normal sinus rhythm  QRS Axis: normal  Intervals: PR shortened  ST/T Wave abnormalities: normal  Conduction Disutrbances:none  Narrative Interpretation: Borderline short PR interval. Otherwise normal ECG. No prior ECG available for comparison.  Old EKG Reviewed: none available    1. Chest pain   2. Dyspnea       MDM  Chest pain and dyspnea which seemed to be part of a subacute to chronic syndrome. She reports CT scans having showed severe infection in her lungs for which she has been on several antibiotics. I have been able to view the CT scans which have been done at [facility] and they show some small areas of groundglass density in the bases of the lung. She already has an appointment with pulmonology in 2 weeks. Have discussed her case with the on-call pulmonologist to see if he had any recommendations for treatment until her scheduled appointment and he did not have any  other did request that we draw sedimentation rate and antinuclear antibody test today. She was given a dose of tramadol for pain which did not cause any nausea but did not give her relief of pain. She states the only medicine which she has been able to take for pain which has not caused problems with nausea but as effective as hydromorphone. She is discharged with prescription for ondansetron and hydromorphone.        Dione Booze, MD 03/14/13 [id number]

## 2013-03-14 NOTE — ED Notes (Signed)
RN First, recheck-  No change in chest pain.  Continue to wait in lobby for room assignment.

## 2013-03-14 NOTE — ED Notes (Signed)
Pt c/o central chest pain that started this AM after taking an antibiotic for "a lung infection." reports feeling SOB. Pt denies rash.

## 2013-03-15 LAB — SEDIMENTATION RATE: Sed Rate: 3 mm/hr (ref 0–22)

## 2013-03-19 ENCOUNTER — Telehealth: Payer: Self-pay | Admitting: *Deleted

## 2013-03-19 ENCOUNTER — Encounter: Payer: Self-pay | Admitting: Pulmonary Disease

## 2013-03-19 ENCOUNTER — Ambulatory Visit (INDEPENDENT_AMBULATORY_CARE_PROVIDER_SITE_OTHER): Payer: BC Managed Care – PPO | Admitting: Pulmonary Disease

## 2013-03-19 VITALS — BP 100/80 | HR 82 | Ht 63.0 in | Wt 141.2 lb

## 2013-03-19 DIAGNOSIS — R05 Cough: Secondary | ICD-10-CM

## 2013-03-19 DIAGNOSIS — R0602 Shortness of breath: Secondary | ICD-10-CM

## 2013-03-19 DIAGNOSIS — R9389 Abnormal findings on diagnostic imaging of other specified body structures: Secondary | ICD-10-CM

## 2013-03-19 DIAGNOSIS — R059 Cough, unspecified: Secondary | ICD-10-CM | POA: Insufficient documentation

## 2013-03-19 DIAGNOSIS — R911 Solitary pulmonary nodule: Secondary | ICD-10-CM

## 2013-03-19 MED ORDER — AMOXICILLIN-POT CLAVULANATE 875-125 MG PO TABS: 1.0000 | ORAL_TABLET | Freq: Two times a day (BID) | ORAL | Status: DC

## 2013-03-19 NOTE — Patient Instructions (Signed)
Stop taking the Levaqin Start taking the augmentin twice a day  We will call you with the results of the pulmonary function testing  We will order a repeat CT scan of your chest one month from now and see you afterwards  Take the augmentin with yogurt or a pro-biotic  Follow up with your primary care physician and the GI doctors regarding the nausea and vomiting.

## 2013-03-19 NOTE — Telephone Encounter (Signed)
Spoke with patient made her aware that Dr. Kendrick Fries is requesting another lab drawn and this can be done at her PCP. ATC PCP @ [phone] but office is currently closed. Will call back in AM to get name of nurse that I need to put Attn to for order

## 2013-03-19 NOTE — Progress Notes (Signed)
Subjective:    Patient ID: Valerie Cortez, female    DOB: [birth year], 22 y.o.   MRN: [id number]  HPI  This is a 22 y/o female with no prior respiratory problems who comes to [place] for evaluation of 5 months of shortness of breath, cough, and chest tightness.  She delivered her first son 5 months ago and not long afterwards was diagnosed with cholecystitis and gallstone pancreatitis.  She has undergone a cholecystectomy and multiple ERCP's/EGD's since.  For the past four months she has had a dry, "croupy" cough with associated episodes of dyspnea and chest tightness.  This has been associated with dyspnea on exertion.  She states that she feels "extremely short of breath" yet she can walk through a grocery store without difficulty, climb a flight of stairs and carry in groceries.  Coughing spells make the dyspnea worse.  She typically has chest tightness with these episodes.  She has been treated with multiple rounds of antibiotics (she thinks Levaquin four times and she noted intolerance to clarithromycin).  Notably, her history regarding the antibiotics varied significantly throughout the interview.  She has not had fevers but shd notes recurrent night sweats lately. Her PCP ordered a PPD which was negative.  She has had some hoarseness but no sinus symptoms.   She also notes recurrent abdominal pain and nausea and vomiting.  She has been diagnosed with pancreatitis (gallstone) but was told on a recent ER visit for abdominal pain that her labs did not suggest pancreatitis.  Lately she has thrown up three times a day or more, sometimes at night.  It is unclear to me if this happened prior to her April 28 ED visit during which time she was found to have the abnormalities on her CT chest.  Past Medical History  Diagnosis Date  . Constipation     treated with diet change  . Headache     migraines  . Hx of chlamydia infection   . Difficult intubation   . Pancreatitis   . Asthma   . Anemia   . Hypotension       Family History  Problem Relation Age of Onset  . Cerebral palsy Mother   . Fibroids Mother   . Diverticulitis Mother   . Migraines Mother   . Asthma Brother   . Cancer Maternal Grandmother     ovarian  . COPD Maternal Grandfather   . Diabetes Maternal Grandfather   . Heart disease Maternal Grandfather     heart valve  . Hypertension Maternal Grandfather   . Cancer Paternal Grandfather     lung     History   Social History  . Marital Status: Married    Spouse Name: N/A    Number of Children: 1  . Years of Education: N/A   Occupational History  . unemployed    Social History Main Topics  . Smoking status: Never Smoker   . Smokeless tobacco: Never Used  . Alcohol Use: No  . Drug Use: No  . Sexually Active: No   Other Topics Concern  . Not on file   Social History Narrative  . No narrative on file     Allergies  Allergen Reactions  . Fentanyl Itching, Rash and Other (See Comments)    "flushed" feeling  . Benadryl (Diphenhydramine Hcl)     "wakes me up"  . Oxycodone     Rash and Hives  . Peanut-Containing Drug Products Hives  . Red Dye Nausea And Vomiting  Red food coloring     Outpatient Prescriptions Prior to Visit  Medication Sig Dispense Refill  . albuterol (PROVENTIL HFA;VENTOLIN HFA) 108 (90 BASE) MCG/ACT inhaler Inhale 2 puffs into the lungs every 6 (six) hours as needed. For shortness of breath      . ALPRAZolam (XANAX) 0.5 MG tablet Take 0.5 mg by mouth 3 (three) times daily as needed for sleep or anxiety. Anxiety      . HYDROmorphone (DILAUDID) 2 MG tablet Take 0.5-1 tablets (1-2 mg total) by mouth every 4 (four) hours as needed for pain.  20 tablet  0  . ondansetron (ZOFRAN) 4 MG tablet Take 1 tablet (4 mg total) by mouth every 6 (six) hours.  20 tablet  0  . clarithromycin (BIAXIN) 500 MG tablet Take 500 mg by mouth 2 (two) times daily.       No facility-administered medications prior to visit.      Review of Systems   Constitutional: Positive for diaphoresis and fatigue. Negative for fever and unexpected weight change.  HENT: Positive for trouble swallowing. Negative for ear pain, nosebleeds, congestion, sore throat, rhinorrhea, sneezing, dental problem, postnasal drip and sinus pressure.   Eyes: Negative for redness and itching.  Respiratory: Positive for cough, chest tightness, shortness of breath and wheezing.   Cardiovascular: Positive for chest pain. Negative for palpitations and leg swelling.  Gastrointestinal: Positive for nausea. Negative for vomiting.  Genitourinary: Negative for dysuria.  Musculoskeletal: Negative for joint swelling.  Skin: Negative for rash.  Neurological: Positive for headaches.  Hematological: Does not bruise/bleed easily.  Psychiatric/Behavioral: Negative for dysphoric mood. The patient is not nervous/anxious.        Objective:   Physical Exam  Filed Vitals:   03/19/13 1159  BP: 100/80  Pulse: 82  Height: 5\' 3"  (1.6 m)  Weight: 141 lb 3.2 oz (64.048 kg)  SpO2: 100%   Gen: well appearing, no acute distress HEENT: NCAT, PERRL, EOMi, OP clear, neck supple without masses PULM: CTA B CV: RRR, no mgr, no JVD AB: BS+, soft, nontender, no hsm Ext: warm, no edema, no clubbing, no cyanosis Derm: no rash or skin breakdown Neuro: A&Ox4, CN II-XII intact, strength 5/5 in all 4 extremities  2/25 CT chest >> 2cm ggo nodule in the RLL, no pulmonary embolism 4/27 CT chest >> 2.8 cm ggo nodule in the RLL, multiple scattered nodules throughout      Assessment & Plan:   Abnormal chest CT The differential diagnosis here includes recurrent aspiration vs an atypical infection (mycobacterial, viral, less likely fungal) vs less likely a neoplastic process.  Given her recurrent history of nausea and vomiting and the lower lobe distribution, I favor aspiration pneumonitis.  It is reasonable to try to treat with an antibiotic before repeating imaging of this lesion.  It is unclear  to me how much antibiotic she has taken, if she has taken it, and how much she has vomited as these details were difficult to clarify by history during our visit. One would expect sputum production in the setting of mycobacterial disease.  Plan: -change antibiotic to augmentin -probiotic with the augmentin -f/u with GI regarding recurrent abdominal pain/nausea/vomiting -continue anti-emetics per PCP/GI medicine -repeat CT chest in 4-5 weeks -if no resolution in the nodules on repeat imaging, then she will need a bronchoscopy with biopsy  SOB (shortness of breath) I am encouraged by the fact that her lungs were clear on exam, her oxygenation was normal, and a recent h/h were normal as  well.  Her described symptoms outweigh her objective findings.  The ddx here includes aspiration, asthma, or much less likely cardiac disease.  Her imaging abnormalities are not dramatic enough to suggest a cause of her dyspnea at this point.  I am encouraged by the fact that she did not have a pulmonary embolism on her February 2014 CT chest.    Plan: -Full PFT's with pre-post bronchodilator -Continue advair for now, though I don't see that it is adding much -in general we will try to treat aspiration and minimize its recurrence -if no improvement and other studies negative by the next visit, then plan cardiac work up  Cough Again, I think that aspiration from her ongoing vomiting is the likely culprit here.  However it is reasonable to test for pertussis given the duration of the cough and her "croup" description of the cough.    Updated Medication List Outpatient Encounter Prescriptions as of 03/19/2013  Medication Sig Dispense Refill  . albuterol (PROVENTIL HFA;VENTOLIN HFA) 108 (90 BASE) MCG/ACT inhaler Inhale 2 puffs into the lungs every 6 (six) hours as needed. For shortness of breath      . ALPRAZolam (XANAX) 0.5 MG tablet Take 0.5 mg by mouth 3 (three) times daily as needed for sleep or anxiety.  Anxiety      . HYDROmorphone (DILAUDID) 2 MG tablet Take 0.5-1 tablets (1-2 mg total) by mouth every 4 (four) hours as needed for pain.  20 tablet  0  . ondansetron (ZOFRAN) 4 MG tablet Take 1 tablet (4 mg total) by mouth every 6 (six) hours.  20 tablet  0  . amoxicillin-clavulanate (AUGMENTIN) 875-125 MG per tablet Take 1 tablet by mouth 2 (two) times daily.  20 tablet  0  . [DISCONTINUED] clarithromycin (BIAXIN) 500 MG tablet Take 500 mg by mouth 2 (two) times daily.       No facility-administered encounter medications on file as of 03/19/2013.

## 2013-03-19 NOTE — Assessment & Plan Note (Signed)
Again, I think that aspiration from her ongoing vomiting is the likely culprit here.  However it is reasonable to test for pertussis given the duration of the cough and her "croup" description of the cough.

## 2013-03-19 NOTE — Telephone Encounter (Signed)
Message copied by Erenest Rasher on Mon Mar 19, 2013  4:47 PM ------      Message from: Max Fickle B      Created: Mon Mar 19, 2013  2:19 PM       Nethra Mehlberg,            I forgot to have this lady tested for pertussis.  I would like for her to have serology testing for this.  She does not need to come back to the office for this, could be done at her PCP's office.              The lab is [facility] Pertussis Igm/IgG antibody.            Thanks,      Kipp Brood ------

## 2013-03-19 NOTE — Assessment & Plan Note (Signed)
I am encouraged by the fact that her lungs were clear on exam, her oxygenation was normal, and a recent h/h were normal as well.  Her described symptoms outweigh her objective findings.  The ddx here includes aspiration, asthma, or much less likely cardiac disease.  Her imaging abnormalities are not dramatic enough to suggest a cause of her dyspnea at this point.  I am encouraged by the fact that she did not have a pulmonary embolism on her February 2014 CT chest.    Plan: -Full PFT's with pre-post bronchodilator -Continue advair for now, though I don't see that it is adding much -in general we will try to treat aspiration and minimize its recurrence -if no improvement and other studies negative by the next visit, then plan cardiac work up

## 2013-03-19 NOTE — Assessment & Plan Note (Addendum)
The differential diagnosis here includes recurrent aspiration vs an atypical infection (mycobacterial, viral, less likely fungal) vs less likely a neoplastic process.  Given her recurrent history of nausea and vomiting and the lower lobe distribution, I favor aspiration pneumonitis.  It is reasonable to try to treat with an antibiotic before repeating imaging of this lesion.  It is unclear to me how much antibiotic she has taken, if she has taken it, and how much she has vomited as these details were difficult to clarify by history during our visit. One would expect sputum production in the setting of mycobacterial disease.  Plan: -change antibiotic to augmentin -probiotic with the augmentin -f/u with GI regarding recurrent abdominal pain/nausea/vomiting -continue anti-emetics per PCP/GI medicine -repeat CT chest in 4-5 weeks -if no resolution in the nodules on repeat imaging, then she will need a bronchoscopy with biopsy

## 2013-03-20 NOTE — Telephone Encounter (Signed)
I have spoken with Baxter Hire at [facility] and let her know that I will be faxing over a lab order for patient per Dr. Kendrick Fries to have drawn at their office [phone]) --Order has been faxed and original Rx placed in folder for scanning  Spoke with Austynn to inform her lab order has been sent and she can go to PCP and have this done at her earliest convienence.  Nothing further.

## 2013-03-27 ENCOUNTER — Encounter: Payer: Self-pay | Admitting: Pulmonary Disease

## 2013-03-27 ENCOUNTER — Telehealth: Payer: Self-pay | Admitting: *Deleted

## 2013-03-27 ENCOUNTER — Institutional Professional Consult (permissible substitution): Payer: BC Managed Care – PPO | Admitting: Internal Medicine

## 2013-03-27 NOTE — Telephone Encounter (Signed)
ATC patient, no answer [id number] 

## 2013-03-27 NOTE — Telephone Encounter (Signed)
Message copied by Erenest Rasher on Tue Mar 27, 2013  5:25 PM ------      Message from: Lupita Leash      Created: Tue Mar 27, 2013  5:17 PM       L,            Please let her know that her pertussis lab was OK.  It shows that she is immune, but has not recently had the illness.  This is good.            Thanks,      Kipp Brood ------

## 2013-03-28 NOTE — Telephone Encounter (Signed)
Returning call can be reached at [phone].Valerie Cortez

## 2013-03-28 NOTE — Telephone Encounter (Signed)
Called spoke with patient Advised of pertussis lab results as stated by BQ below Pt verbalized her understanding and denied any questions Nothing further needed; will sign off

## 2013-04-16 ENCOUNTER — Ambulatory Visit (INDEPENDENT_AMBULATORY_CARE_PROVIDER_SITE_OTHER): Payer: BC Managed Care – PPO | Admitting: Pulmonary Disease

## 2013-04-16 ENCOUNTER — Ambulatory Visit (INDEPENDENT_AMBULATORY_CARE_PROVIDER_SITE_OTHER)
Admission: RE | Admit: 2013-04-16 | Discharge: 2013-04-16 | Disposition: A | Discharge status: Home / Self Care | Payer: BC Managed Care – PPO | Source: Ambulatory Visit | Attending: Pulmonary Disease | Admitting: Pulmonary Disease

## 2013-04-16 DIAGNOSIS — R911 Solitary pulmonary nodule: Secondary | ICD-10-CM

## 2013-04-16 DIAGNOSIS — R0602 Shortness of breath: Secondary | ICD-10-CM

## 2013-04-16 LAB — PULMONARY FUNCTION TEST

## 2013-04-16 NOTE — Progress Notes (Signed)
PFT done today. 

## 2013-04-17 ENCOUNTER — Other Ambulatory Visit: Payer: Self-pay | Admitting: Pulmonary Disease

## 2013-04-17 ENCOUNTER — Telehealth: Payer: Self-pay | Admitting: Pulmonary Disease

## 2013-04-17 ENCOUNTER — Encounter: Payer: Self-pay | Admitting: Pulmonary Disease

## 2013-04-17 ENCOUNTER — Telehealth: Payer: Self-pay | Admitting: *Deleted

## 2013-04-17 DIAGNOSIS — R911 Solitary pulmonary nodule: Secondary | ICD-10-CM

## 2013-04-17 NOTE — Telephone Encounter (Signed)
Per phone msg from either today, Valerie Cortez has spoken with pt regarding bronch.   Called, spoke with pt.  Pt states she has already spoken with Valerie Cortez, who has answered all of her questions.  Nothing further needed at this time. She is aware to call back with any questions or concerns.  She verbalized understanding and voiced no further questions or concerns at this time.

## 2013-04-17 NOTE — Telephone Encounter (Signed)
Bronch has been scheduled for 04-23-13 at 1:30pm at [facility]. BQ is aware that the test could not be done in the AM per [facility] in Respiratory.  Pt is aware of when her procedure will be done. I advised her of all the do's and don'ts before the procedure. She will contact her PCP to get the specific blood work done. Nothing further was needed.

## 2013-04-17 NOTE — Telephone Encounter (Signed)
Message copied by Caryl Ada on Tue Apr 17, 2013  9:51 AM ------      Message from: Lupita Leash      Created: Tue Apr 17, 2013  9:23 AM       L,            Please help arrange a bronch on Monday 6/9 AM first case.  She needs to have a CBC and PT/INR done and faxed to [place] before the case.            Thanks,      B ------

## 2013-04-17 NOTE — Telephone Encounter (Signed)
I called Valerie Cortez to discuss the results of her CT scan but had to leave a message.

## 2013-04-20 ENCOUNTER — Encounter (HOSPITAL_COMMUNITY): Payer: Self-pay

## 2013-04-23 ENCOUNTER — Encounter: Payer: Self-pay | Admitting: Pulmonary Disease

## 2013-04-23 ENCOUNTER — Ambulatory Visit (HOSPITAL_COMMUNITY)
Admission: RE | Admit: 2013-04-23 | Discharge: 2013-04-23 | Disposition: A | Discharge status: Home / Self Care | Payer: BC Managed Care – PPO | Source: Ambulatory Visit | Attending: Pulmonary Disease | Admitting: Pulmonary Disease

## 2013-04-23 ENCOUNTER — Other Ambulatory Visit: Payer: Self-pay | Admitting: Pulmonary Disease

## 2013-04-23 ENCOUNTER — Ambulatory Visit: Payer: BC Managed Care – PPO | Admitting: Pulmonary Disease

## 2013-04-23 DIAGNOSIS — R222 Localized swelling, mass and lump, trunk: Secondary | ICD-10-CM | POA: Insufficient documentation

## 2013-04-23 DIAGNOSIS — R209 Unspecified disturbances of skin sensation: Secondary | ICD-10-CM | POA: Insufficient documentation

## 2013-04-23 DIAGNOSIS — R202 Paresthesia of skin: Secondary | ICD-10-CM

## 2013-04-23 DIAGNOSIS — R200 Anesthesia of skin: Secondary | ICD-10-CM

## 2013-04-23 DIAGNOSIS — R9389 Abnormal findings on diagnostic imaging of other specified body structures: Secondary | ICD-10-CM

## 2013-04-23 DIAGNOSIS — R531 Weakness: Secondary | ICD-10-CM

## 2013-04-23 DIAGNOSIS — R29898 Other symptoms and signs involving the musculoskeletal system: Secondary | ICD-10-CM | POA: Insufficient documentation

## 2013-04-23 DIAGNOSIS — R5381 Other malaise: Secondary | ICD-10-CM | POA: Insufficient documentation

## 2013-04-23 MED ORDER — MIDAZOLAM HCL 10 MG/2ML IJ SOLN
INTRAMUSCULAR | Status: AC
  Filled 2013-04-23: qty 4

## 2013-04-23 MED ORDER — SODIUM CHLORIDE 0.9 % IV SOLN
INTRAVENOUS | Status: DC
  Administered 2013-04-23: 13:00:00 via INTRAVENOUS

## 2013-04-23 MED ORDER — LIDOCAINE HCL 2 % EX GEL
CUTANEOUS | Status: DC | PRN
  Administered 2013-04-23: 1

## 2013-04-23 MED ORDER — MEPERIDINE HCL 100 MG/ML IJ SOLN
INTRAMUSCULAR | Status: AC
  Filled 2013-04-23: qty 2

## 2013-04-23 MED ORDER — PHENYLEPHRINE HCL 0.25 % NA SOLN
NASAL | Status: DC | PRN
  Administered 2013-04-23: 2 via NASAL

## 2013-04-23 MED ORDER — LIDOCAINE HCL (PF) 1 % IJ SOLN
INTRAMUSCULAR | Status: DC | PRN
  Administered 2013-04-23: 6 mL

## 2013-04-23 NOTE — Progress Notes (Signed)
I came to the bronch suite this afternoon for Valerie Cortez's bronchoscopy.  Prior to the procedure or receiving any medications she developed weakness, numbness, and tingling of her upper extremities.  Neuro exam was normal, but she stated that this had been a recurrent problem lately.  Symptoms eventually resolved.  She adamantly denied anxiety.   After lengthy conversation regarding the situation with the patient, her husband and her mother, the patient eventually stated that she was worried that the bronchoscopy would yield a diagnosis. They expressed great frustration with her situation.   I explained to her that the definitive diagnosis would come from a VATS biopsy. They agreed to this approach.  In terms of the numbness and tingling, I suspect that this is anxiety related despite her denial.  Given the chronicity, it may be reasonable to have a neurology evaluation. We will consult neurology and thoracic surgery.

## 2013-04-24 ENCOUNTER — Other Ambulatory Visit: Payer: Self-pay | Admitting: Pulmonary Disease

## 2013-04-24 DIAGNOSIS — J849 Interstitial pulmonary disease, unspecified: Secondary | ICD-10-CM

## 2013-04-25 ENCOUNTER — Encounter (HOSPITAL_COMMUNITY): Payer: BC Managed Care – PPO

## 2013-04-25 ENCOUNTER — Encounter (HOSPITAL_COMMUNITY)
Admission: RE | Disposition: A | Discharge status: Home / Self Care | Payer: Self-pay | Source: Ambulatory Visit | Attending: Pulmonary Disease

## 2013-04-25 SURGERY — BRONCHOSCOPY, WITH FLUOROSCOPY
Anesthesia: Moderate Sedation | Laterality: Bilateral

## 2013-04-26 ENCOUNTER — Telehealth: Payer: Self-pay | Admitting: Pulmonary Disease

## 2013-04-27 NOTE — Telephone Encounter (Signed)
Will forward to Dr. McQuaid as an FYI 

## 2013-04-30 ENCOUNTER — Encounter: Payer: Self-pay | Admitting: Pulmonary Disease

## 2013-04-30 NOTE — Telephone Encounter (Signed)
noted 

## 2013-05-03 ENCOUNTER — Encounter: Payer: BC Managed Care – PPO | Admitting: Thoracic Surgery (Cardiothoracic Vascular Surgery)

## 2013-10-29 NOTE — Telephone Encounter (Signed)
error 

## 2014-02-11 LAB — OB RESULTS CONSOLE RPR: RPR: NONREACTIVE

## 2014-02-11 LAB — OB RESULTS CONSOLE HEPATITIS B SURFACE ANTIGEN: HEP B S AG: NEGATIVE

## 2014-02-11 LAB — OB RESULTS CONSOLE ABO/RH: RH Type: POSITIVE

## 2014-02-11 LAB — OB RESULTS CONSOLE RUBELLA ANTIBODY, IGM: RUBELLA: IMMUNE

## 2014-02-11 LAB — OB RESULTS CONSOLE ANTIBODY SCREEN: Antibody Screen: NEGATIVE

## 2014-02-11 LAB — OB RESULTS CONSOLE HIV ANTIBODY (ROUTINE TESTING): HIV: NONREACTIVE

## 2014-05-03 IMAGING — CT CT ABD-PELV W/ CM
1 of 2 series · 15 of 32 positions shown, 19 images · IV contrast (OMNIPAQUE 300)
Comparison: 11/30/2012 MRCP

CLINICAL DATA: 21-year-old female with abdominal and pelvic pain.
History of cholecystectomy and elevated lipase.

CT ABDOMEN AND PELVIS WITH CONTRAST
TECHNIQUE: Multidetector CT imaging of the abdomen and pelvis was
performed following the standard protocol during bolus
administration of intravenous contrast.
Contrast:  100 ml intravenous Pmnipaque-YHH

[Series 2: abd/pel with · axial · 0.75mm/px · z∈[+1357,+1767]mm · 15 of 92 slices shown, 19 images]
[im 5/92  soft-tissue]
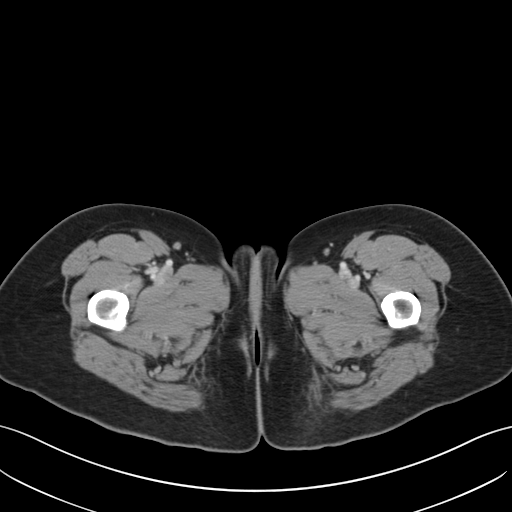
[im 5/92  bone]
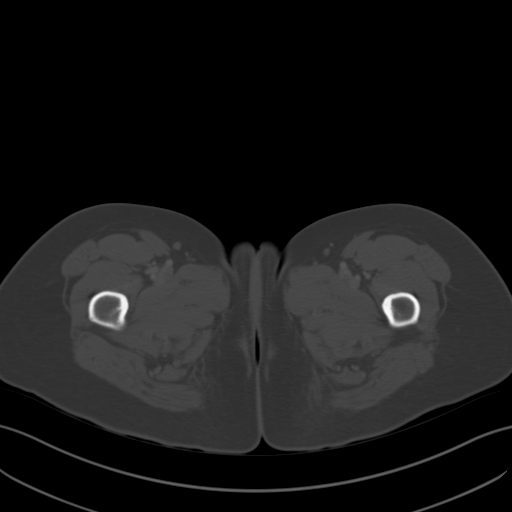
[im 13/92  soft-tissue]
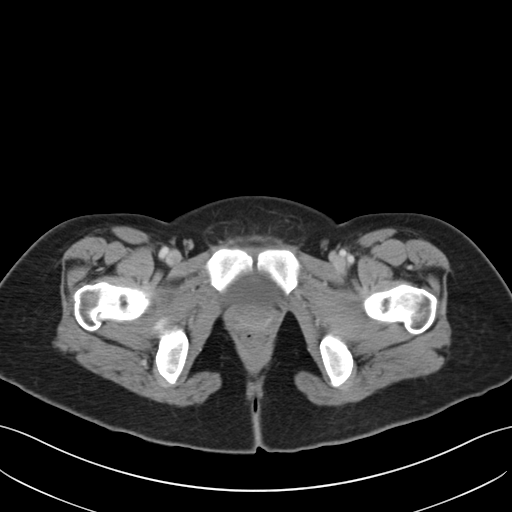
[im 21/92  soft-tissue]
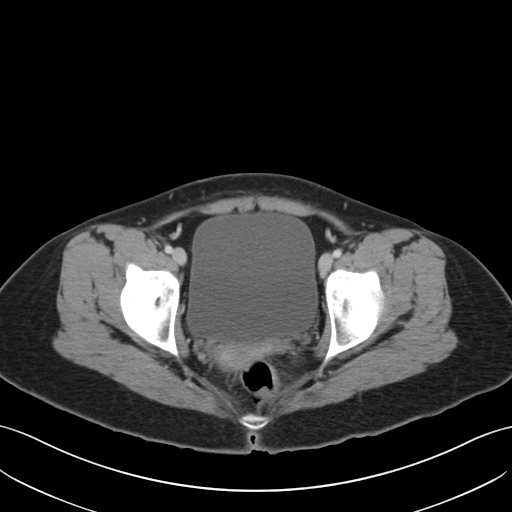
[im 25/92  soft-tissue]
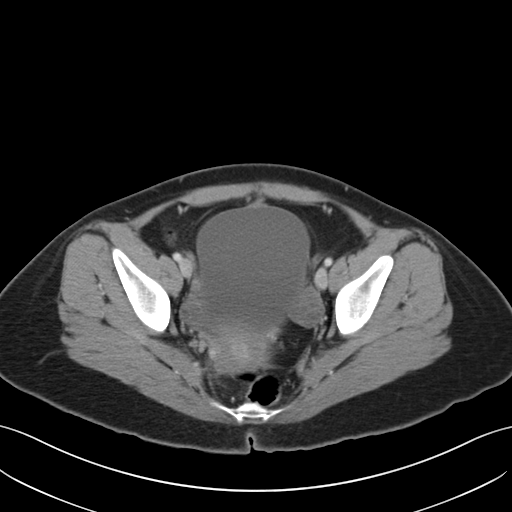
[im 34/92  soft-tissue]
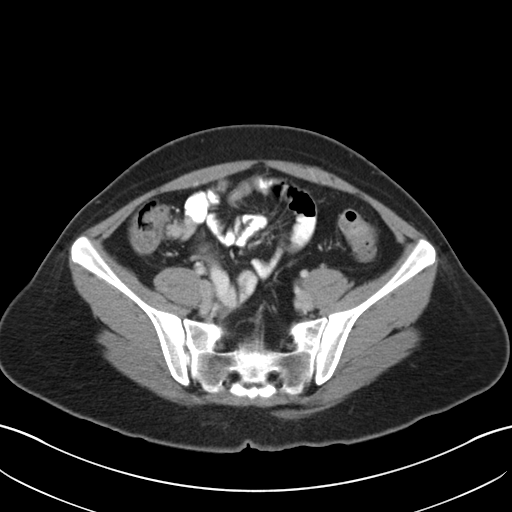
[im 38/92  soft-tissue]
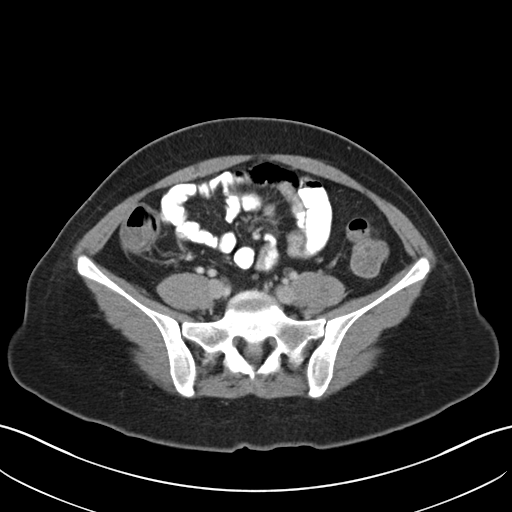
[im 46/92  soft-tissue]
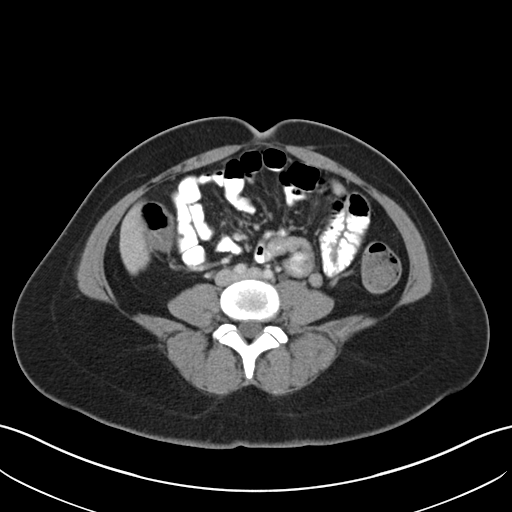
[im 54/92  soft-tissue]
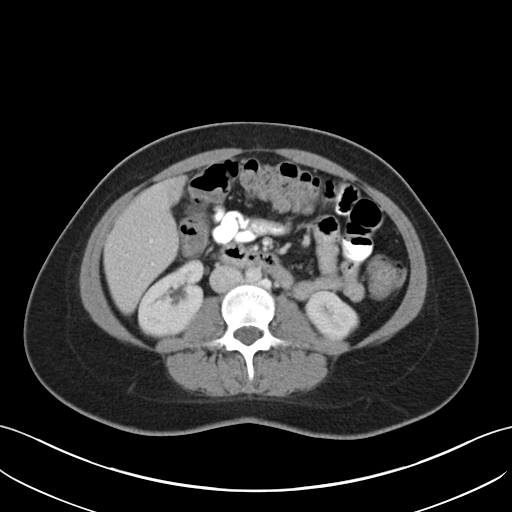
[im 58/92  soft-tissue]
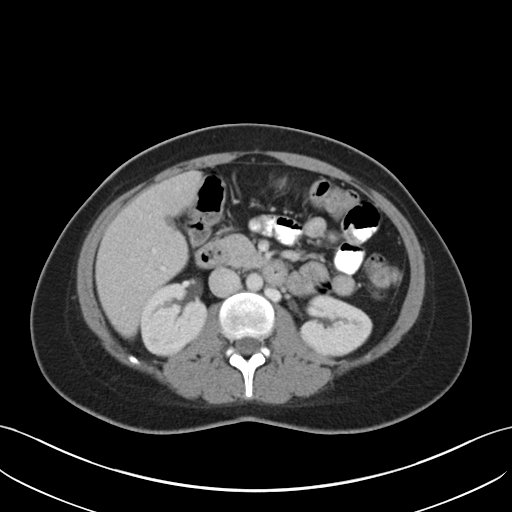
[im 58/92  bone]
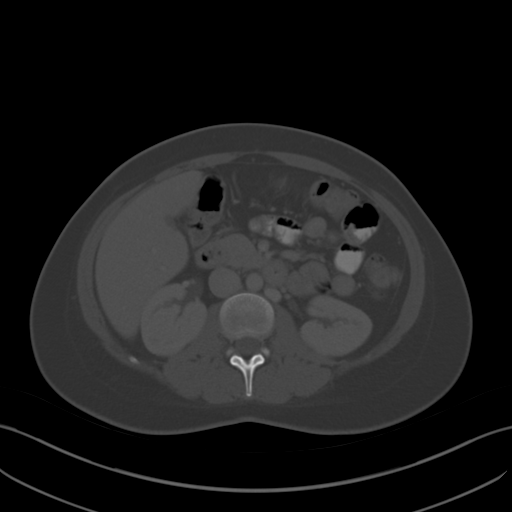
[im 67/92  soft-tissue]
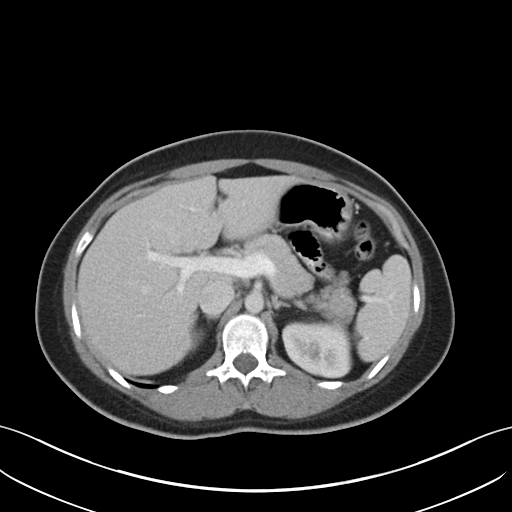
[im 71/92  soft-tissue]
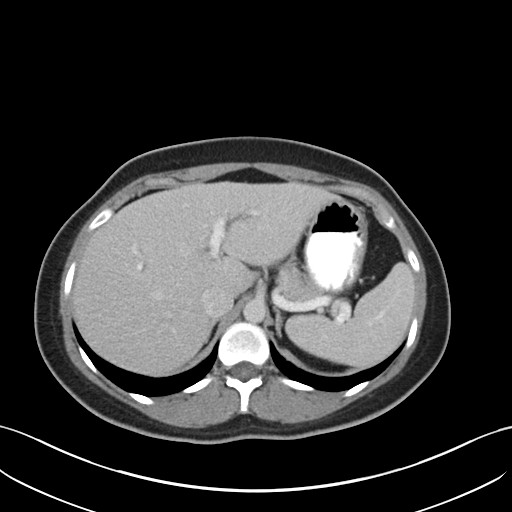
[im 75/92  lung]
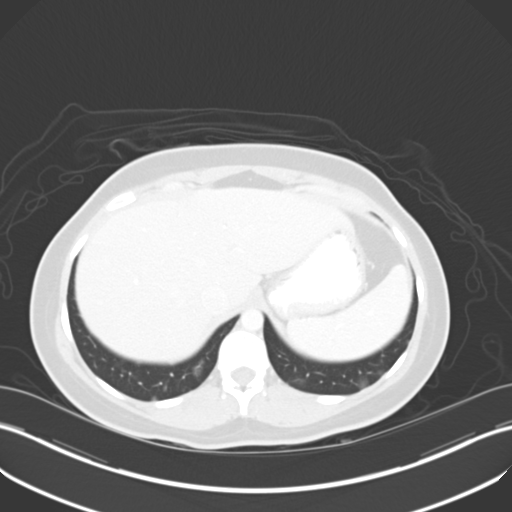
[im 79/92  soft-tissue]
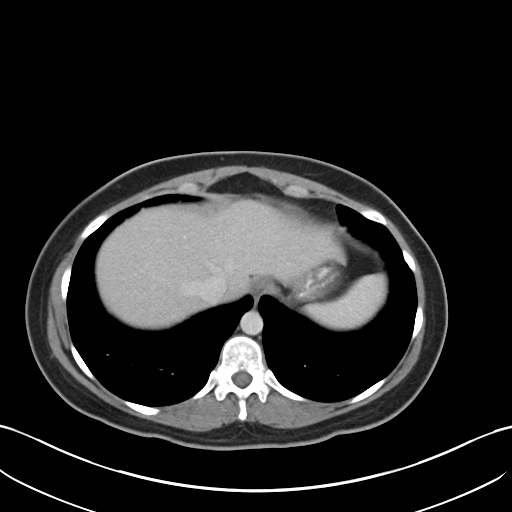
[im 79/92  lung]
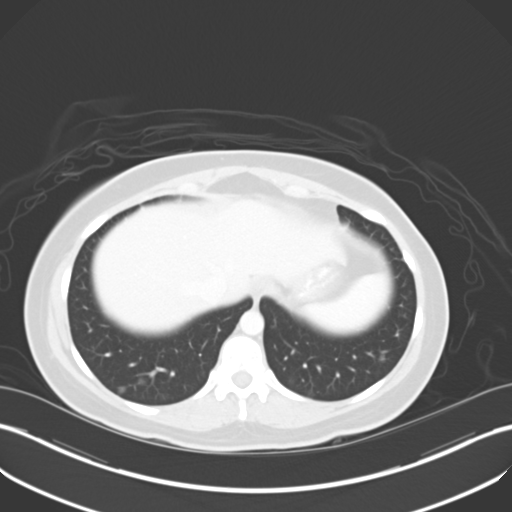
[im 83/92  lung]
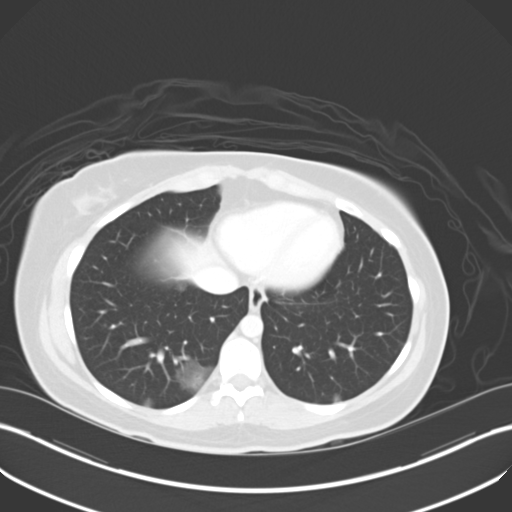
[im 87/92  soft-tissue]
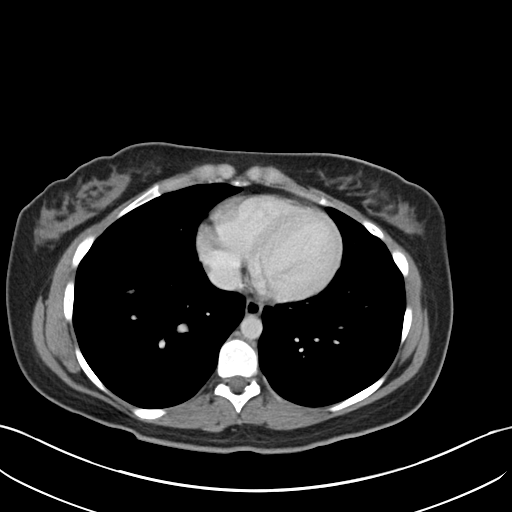
[im 87/92  lung]
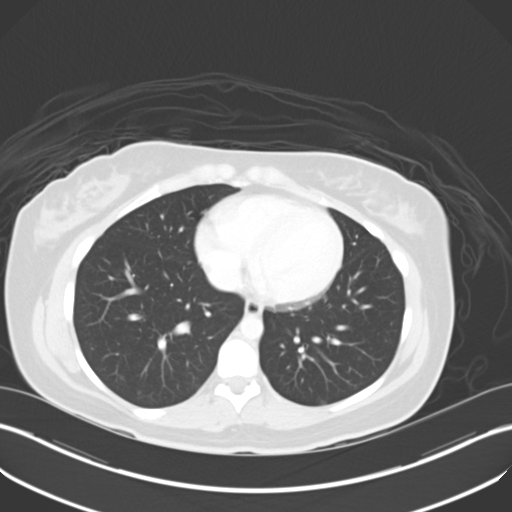

[15 of 32 positions shown; findings below may reference images not displayed]

FINDINGS: Patchy ground-glass opacities within the lung bases,
right greater than left are noted and may represent inflammation or
infection.

The liver, spleen, pancreas, adrenal glands and kidneys are
unremarkable.
The patient is status post cholecystectomy.

No free fluid, enlarged lymph nodes, biliary dilation or abdominal
aortic aneurysm identified.
The uterus and adnexal regions are within normal limits.
The bowel, bladder and appendix are unremarkable.

No acute or suspicious bony abnormalities are identified.
IMPRESSION: Patchy ground-glass opacities within both lung bases - question
infection/pneumonia or nonspecific inflammation.

No evidence of acute abnormality within the abdomen or pelvis.

## 2014-06-23 ENCOUNTER — Other Ambulatory Visit: Payer: Self-pay | Admitting: Obstetrics and Gynecology

## 2014-07-25 ENCOUNTER — Inpatient Hospital Stay (HOSPITAL_COMMUNITY)
Admission: AD | Admit: 2014-07-25 | Discharge: 2014-07-25 | Disposition: A | Discharge status: Home / Self Care | Payer: BC Managed Care – PPO | Source: Ambulatory Visit | Attending: Obstetrics and Gynecology | Admitting: Obstetrics and Gynecology

## 2014-07-25 ENCOUNTER — Encounter (HOSPITAL_COMMUNITY): Payer: Self-pay

## 2014-07-25 DIAGNOSIS — O4700 False labor before 37 completed weeks of gestation, unspecified trimester: Secondary | ICD-10-CM | POA: Diagnosis not present

## 2014-07-25 DIAGNOSIS — O479 False labor, unspecified: Secondary | ICD-10-CM

## 2014-07-25 NOTE — MAU Note (Addendum)
Patient states she started having contractions last night, today more painful and every 2 minutes. Denies bleeding or leaking. Reports fetal movement but not as much as usual. Feels different.

## 2014-07-25 NOTE — Discharge Instructions (Signed)
Braxton Hicks Contractions °Contractions of the uterus can occur throughout pregnancy. Contractions are not always a sign that you are in labor.  °WHAT ARE BRAXTON HICKS CONTRACTIONS?  °Contractions that occur before labor are called Braxton Hicks contractions, or false labor. Toward the end of pregnancy (32-34 weeks), these contractions can develop more often and may become more forceful. This is not true labor because these contractions do not result in opening (dilatation) and thinning of the cervix. They are sometimes difficult to tell apart from true labor because these contractions can be forceful and people have different pain tolerances. You should not feel embarrassed if you go to the hospital with false labor. Sometimes, the only way to tell if you are in true labor is for your health care provider to look for changes in the cervix. °If there are no prenatal problems or other health problems associated with the pregnancy, it is completely safe to be sent home with false labor and await the onset of true labor. °HOW CAN YOU TELL THE DIFFERENCE BETWEEN TRUE AND FALSE LABOR? °False Labor °· The contractions of false labor are usually shorter and not as hard as those of true labor.   °· The contractions are usually irregular.   °· The contractions are often felt in the front of the lower abdomen and in the groin.   °· The contractions may go away when you walk around or change positions while lying down.   °· The contractions get weaker and are shorter lasting as time goes on.   °· The contractions do not usually become progressively stronger, regular, and closer together as with true labor.   °True Labor °· Contractions in true labor last 30-70 seconds, become very regular, usually become more intense, and increase in frequency.   °· The contractions do not go away with walking.   °· The discomfort is usually felt in the top of the uterus and spreads to the lower abdomen and low back.   °· True labor can be  determined by your health care provider with an exam. This will show that the cervix is dilating and getting thinner.   °WHAT TO REMEMBER °· Keep up with your usual exercises and follow other instructions given by your health care provider.   °· Take medicines as directed by your health care provider.   °· Keep your regular prenatal appointments.   °· Eat and drink lightly if you think you are going into labor.   °· If Braxton Hicks contractions are making you uncomfortable:   °¨ Change your position from lying down or resting to walking, or from walking to resting.   °¨ Sit and rest in a tub of warm water.   °¨ Drink 2-3 glasses of water. Dehydration may cause these contractions.   °¨ Do slow and deep breathing several times an hour.   °WHEN SHOULD I SEEK IMMEDIATE MEDICAL CARE? °Seek immediate medical care if: °· Your contractions become stronger, more regular, and closer together.   °· You have fluid leaking or gushing from your vagina.   °· You have a fever.   °· You pass blood-tinged mucus.   °· You have vaginal bleeding.   °· You have continuous abdominal pain.   °· You have low back pain that you never had before.   °· You feel your baby's head pushing down and causing pelvic pressure.   °· Your baby is not moving as much as it used to.   °Document Released: 11/01/2005 Document Revised: 11/06/2013 Document Reviewed: 08/13/2013 °Exit[employer]® Patient Information ©2015 [employer]. This information is not intended to replace advice given to you by your health care   provider. Make sure you discuss any questions you have with your health care provider. ° °

## 2014-07-25 NOTE — MAU Provider Note (Signed)
History     CSN: [id number]  Arrival date and time: 07/25/14 1251   First Provider Initiated Contact with Patient 07/25/14 1341      Chief Complaint  Patient presents with  . Contractions   HPI Pt is a 23 yo G2P1001 at [redacted]w[redacted]d wks IUP here with report of contractions that increased in frequency last night, today more painful and every 2 minutes. Denies bleeding or leaking. Reports fetal movement but not as much as usual. Feels different.    Past Medical History  Diagnosis Date  . Constipation     treated with diet change  . Headache(784.0)     migraines  . Hx of chlamydia infection   . Difficult intubation   . Pancreatitis   . Asthma   . Anemia   . Hypotension     Past Surgical History  Procedure Laterality Date  . Wisdom tooth extraction    . Cesarean section  10/04/2012    Procedure: CESAREAN SECTION;  Surgeon: Bing Plume, MD;  Location: [facility];  Service: Obstetrics;  Laterality: N/A;  . Cholecystectomy  12/02/2012    Procedure: LAPAROSCOPIC CHOLECYSTECTOMY WITH INTRAOPERATIVE CHOLANGIOGRAM;  Surgeon: Romie Levee, MD;  Location: [facility];  Service: General;  Laterality: N/A;  . Ercp  12/04/2012    Procedure: ENDOSCOPIC RETROGRADE CHOLANGIOPANCREATOGRAPHY (ERCP);  Surgeon: Barrie Folk, MD;  Location: [facility];  Service: Endoscopy;  Laterality: N/A;  . Ercp  12/05/2012    Procedure: ENDOSCOPIC RETROGRADE CHOLANGIOPANCREATOGRAPHY (ERCP);  Surgeon: Barrie Folk, MD;  Location: [facility];  Service: Endoscopy;  Laterality: N/A;  . Eus N/A 01/24/2013    Procedure: ESOPHAGEAL ENDOSCOPIC ULTRASOUND (EUS) RADIAL;  Surgeon: Willis Modena, MD;  Location: [facility];  Service: Endoscopy;  Laterality: N/A;    Family History  Problem Relation Age of Onset  . Cerebral palsy Mother   . Fibroids Mother   . Diverticulitis Mother   . Migraines Mother   . Asthma Brother   . Cancer Maternal Grandmother     ovarian  . COPD Maternal Grandfather   . Diabetes Maternal  Grandfather   . Heart disease Maternal Grandfather     heart valve  . Hypertension Maternal Grandfather   . Cancer Paternal Grandfather     lung    History  Substance Use Topics  . Smoking status: Never Smoker   . Smokeless tobacco: Never Used  . Alcohol Use: No    Allergies:  Allergies  Allergen Reactions  . Fentanyl Itching, Rash and Other (See Comments)    "flushed" feeling  . Benadryl [Diphenhydramine Hcl]     "wakes me up"  . Oxycodone     Rash and Hives  . Peanut-Containing Drug Products Hives  . Red Dye Nausea And Vomiting    Red food coloring    Prescriptions prior to admission  Medication Sig Dispense Refill  . albuterol (PROVENTIL HFA;VENTOLIN HFA) 108 (90 BASE) MCG/ACT inhaler Inhale 2 puffs into the lungs every 6 (six) hours as needed. For shortness of breath      . ALPRAZolam (XANAX) 0.5 MG tablet Take 0.5 mg by mouth 3 (three) times daily as needed for sleep or anxiety. Anxiety      . amoxicillin-clavulanate (AUGMENTIN) 875-125 MG per tablet Take 1 tablet by mouth 2 (two) times daily.  20 tablet  0  . HYDROmorphone (DILAUDID) 2 MG tablet Take 0.5-1 tablets (1-2 mg total) by mouth every 4 (four) hours as needed for pain.  20 tablet  0  .  ondansetron (ZOFRAN) 4 MG tablet Take 1 tablet (4 mg total) by mouth every 6 (six) hours.  20 tablet  0    Review of Systems  Gastrointestinal: Positive for nausea and abdominal pain. Negative for vomiting, diarrhea and constipation.  Genitourinary: Negative.   All other systems reviewed and are negative.  Physical Exam   Blood pressure 107/74, pulse 98, temperature 98.8 F (37.1 C), temperature source Oral, resp. rate 18, height 5' 1.5" (1.562 m), weight 74.39 kg (164 lb), SpO2 99.00%.  Physical Exam  Constitutional: She is oriented to person, place, and time. She appears well-developed and well-nourished. No distress.  HENT:  Head: Normocephalic.  Neck: Normal range of motion. Neck supple.  Cardiovascular: Normal  rate, regular rhythm and normal heart sounds.   Respiratory: Effort normal and breath sounds normal.  GI: Soft. There is no tenderness.  Genitourinary: No bleeding around the vagina. Vaginal discharge (mucusy) found.  Neurological: She is alert and oriented to person, place, and time.  Skin: Skin is warm and dry.   Dilation: Closed Effacement (%): Thick Cervical Position: Posterior Exam by:: Margarita Mail, CNM  FHR 120's, +accel Toco - 4-6   Consulted with Dr. Ambrose Mantle > reviewed HPI/exam/OB hx > discharge to home with reassurance  MAU Course  Procedures   Assessment and Plan  23 yo G2P1001 at [redacted]w[redacted]d wks IUP Preterm Contractions - closed/stable Category I FHR Tracing  Plan: Discharge to home Provided reassurance Keep scheduled appointment  Rochele Pages N 07/25/2014, 1:43 PM

## 2014-07-25 NOTE — MAU Note (Signed)
C/o ucs since last night with ucs getting progressively stronger; no hx of preterm labor with last pregnancy;was fingertip to one a week ago in OB's office;

## 2014-07-30 LAB — OB RESULTS CONSOLE GBS: STREP GROUP B AG: NEGATIVE

## 2014-08-14 ENCOUNTER — Encounter (HOSPITAL_COMMUNITY): Payer: Self-pay | Admitting: Pharmacist

## 2014-08-26 ENCOUNTER — Encounter (HOSPITAL_COMMUNITY): Payer: Self-pay

## 2014-08-26 NOTE — Patient Instructions (Addendum)
   Your procedure is scheduled on:  Wednesday, Oct 14  [facility] at: 7 AM Pick up the phone at the desk and dial [phone] and inform us of your arrival.  Please call this number if you have any problems the morning of surgery: [phone]  Remember: Do not eat or drink after midnight: Tonight - Tuesday Take these medicines the morning of surgery with a SIP OF WATER:  None  Do not wear jewelry, make-up, or FINGER nail polish No metal in your hair or on your body. Do not wear lotions, powders, perfumes.  You may wear deodorant.  Do not bring valuables to the hospital. Contacts, dentures or bridgework may not be worn into surgery.  Leave suitcase in the car. After Surgery it may be brought to your room. For patients being admitted to the hospital, checkout time is 11:00am the day of discharge.  Home with husband Casimiro NeedleMichael cell [phone].

## 2014-08-27 ENCOUNTER — Encounter (HOSPITAL_COMMUNITY): Payer: Self-pay

## 2014-08-27 ENCOUNTER — Encounter (HOSPITAL_COMMUNITY)
Admission: RE | Admit: 2014-08-27 | Discharge: 2014-08-27 | Disposition: A | Discharge status: Home / Self Care | Payer: BC Managed Care – PPO | Source: Ambulatory Visit | Attending: Obstetrics and Gynecology | Admitting: Obstetrics and Gynecology

## 2014-08-27 ENCOUNTER — Encounter (HOSPITAL_COMMUNITY): Payer: Self-pay | Admitting: Anesthesiology

## 2014-08-27 HISTORY — DX: Gastro-esophageal reflux disease without esophagitis: K21.9

## 2014-08-27 HISTORY — DX: Pneumonia, unspecified organism: J18.9

## 2014-08-27 LAB — COMPREHENSIVE METABOLIC PANEL
ALT: 15 U/L (ref 0–35)
ANION GAP: 9 (ref 5–15)
AST: 18 U/L (ref 0–37)
Albumin: 2.6 g/dL — ABNORMAL LOW (ref 3.5–5.2)
Alkaline Phosphatase: 197 U/L — ABNORMAL HIGH (ref 39–117)
BILIRUBIN TOTAL: 0.2 mg/dL — AB (ref 0.3–1.2)
BUN: 4 mg/dL — AB (ref 6–23)
CHLORIDE: 102 meq/L (ref 96–112)
CO2: 25 mEq/L (ref 19–32)
Calcium: 8.9 mg/dL (ref 8.4–10.5)
Creatinine, Ser: 0.45 mg/dL — ABNORMAL LOW (ref 0.50–1.10)
GFR calc non Af Amer: >90 mL/min (ref >90)
GLUCOSE: 79 mg/dL (ref 70–99)
Potassium: 4.1 mEq/L (ref 3.7–5.3)
SODIUM: 136 meq/L — AB (ref 137–147)
TOTAL PROTEIN: 6.3 g/dL (ref 6.0–8.3)

## 2014-08-27 LAB — CBC
HEMATOCRIT: 37.7 % (ref 36.0–46.0)
HEMOGLOBIN: 12.5 g/dL (ref 12.0–15.0)
MCH: 28.7 pg (ref 26.0–34.0)
MCHC: 33.2 g/dL (ref 30.0–36.0)
MCV: 86.5 fL (ref 78.0–100.0)
Platelets: 127 10*3/uL — ABNORMAL LOW (ref 150–400)
RBC: 4.36 MIL/uL (ref 3.87–5.11)
RDW: 14.4 % (ref 11.5–15.5)
WBC: 7.2 10*3/uL (ref 4.0–10.5)

## 2014-08-27 LAB — TYPE AND SCREEN
ABO/RH(D): AB POS
Antibody Screen: NEGATIVE

## 2014-08-27 LAB — RPR

## 2014-08-27 LAB — ABO/RH: ABO/RH(D): AB POS

## 2014-08-27 NOTE — Anesthesia Preprocedure Evaluation (Signed)
Anesthesia Evaluation  Patient identified by MRN, date of birth, ID band Patient awake    Reviewed: Allergy & Precautions, H&P , Patient's Chart, lab work & pertinent test results  History of Anesthesia Complications (+) DIFFICULT AIRWAY and history of anesthetic complications  Airway Mallampati: III TM Distance: >3 FB Neck ROM: Full    Dental no notable dental hx. (+) Teeth Intact   Pulmonary shortness of breath and with exertion, asthma , pneumonia -, resolved,  breath sounds clear to auscultation  Pulmonary exam normal       Cardiovascular negative cardio ROS  Rhythm:Regular Rate:Normal     Neuro/Psych  Headaches, negative psych ROS   GI/Hepatic Neg liver ROS, GERD-  Medicated and Controlled,Hx/o Gallstone pancreatitis   Endo/Other  Obesity  Renal/GU negative Renal ROS  negative genitourinary   Musculoskeletal negative musculoskeletal ROS (+)   Abdominal (+) + obese,   Peds  Hematology  (+) anemia ,   Anesthesia Other Findings   Reproductive/Obstetrics (+) Pregnancy Previous C/Section                           Anesthesia Physical Anesthesia Plan  ASA: II  Anesthesia Plan: Spinal   Post-op Pain Management:    Induction:   Airway Management Planned: Natural Airway  Additional Equipment:   Intra-op Plan:   Post-operative Plan:   Informed Consent: I have reviewed the patients History and Physical, chart, labs and discussed the procedure including the risks, benefits and alternatives for the proposed anesthesia with the patient or authorized representative who has indicated his/her understanding and acceptance.     Plan Discussed with: Anesthesiologist, Surgeon and CRNA  Anesthesia Plan Comments:         Anesthesia Quick Evaluation

## 2014-08-27 NOTE — Pre-Procedure Instructions (Signed)
Dr Brayton CavesFreeman Jackson notified of patient's low platelets 127 at PAT appt.  T/O to Stat Platelets on DOS.  Verified T/O, lab placed in EPIC for DOS.

## 2014-08-28 ENCOUNTER — Inpatient Hospital Stay (HOSPITAL_COMMUNITY): Payer: BC Managed Care – PPO | Admitting: Anesthesiology

## 2014-08-28 ENCOUNTER — Encounter (HOSPITAL_COMMUNITY): Payer: BC Managed Care – PPO | Admitting: Anesthesiology

## 2014-08-28 ENCOUNTER — Encounter (HOSPITAL_COMMUNITY)
Admission: RE | Disposition: A | Discharge status: Home / Self Care | Payer: Self-pay | Source: Ambulatory Visit | Attending: Obstetrics and Gynecology

## 2014-08-28 ENCOUNTER — Inpatient Hospital Stay (HOSPITAL_COMMUNITY)
Admission: RE | Admit: 2014-08-28 | Discharge: 2014-08-30 | DRG: 765 | Disposition: A | Discharge status: Home / Self Care | Payer: BC Managed Care – PPO | Source: Ambulatory Visit | Attending: Obstetrics and Gynecology | Admitting: Obstetrics and Gynecology

## 2014-08-28 ENCOUNTER — Encounter (HOSPITAL_COMMUNITY)
Admission: AD | Disposition: A | Discharge status: Home / Self Care | Payer: Self-pay | Source: Ambulatory Visit | Attending: Obstetrics and Gynecology

## 2014-08-28 ENCOUNTER — Encounter (HOSPITAL_COMMUNITY): Payer: Self-pay

## 2014-08-28 DIAGNOSIS — Z833 Family history of diabetes mellitus: Secondary | ICD-10-CM | POA: Diagnosis not present

## 2014-08-28 DIAGNOSIS — O3421 Maternal care for scar from previous cesarean delivery: Secondary | ICD-10-CM | POA: Diagnosis present

## 2014-08-28 DIAGNOSIS — Z3A39 39 weeks gestation of pregnancy: Secondary | ICD-10-CM | POA: Diagnosis present

## 2014-08-28 DIAGNOSIS — K219 Gastro-esophageal reflux disease without esophagitis: Secondary | ICD-10-CM | POA: Diagnosis present

## 2014-08-28 DIAGNOSIS — K5190 Ulcerative colitis, unspecified, without complications: Secondary | ICD-10-CM | POA: Diagnosis present

## 2014-08-28 DIAGNOSIS — O99613 Diseases of the digestive system complicating pregnancy, third trimester: Secondary | ICD-10-CM | POA: Diagnosis present

## 2014-08-28 DIAGNOSIS — Z98891 History of uterine scar from previous surgery: Secondary | ICD-10-CM

## 2014-08-28 LAB — CBC
HEMATOCRIT: 30.7 % — AB (ref 36.0–46.0)
Hemoglobin: 10.3 g/dL — ABNORMAL LOW (ref 12.0–15.0)
MCH: 28.8 pg (ref 26.0–34.0)
MCHC: 33.6 g/dL (ref 30.0–36.0)
MCV: 85.8 fL (ref 78.0–100.0)
PLATELETS: 117 10*3/uL — AB (ref 150–400)
RBC: 3.58 MIL/uL — ABNORMAL LOW (ref 3.87–5.11)
RDW: 14.1 % (ref 11.5–15.5)
WBC: 10 10*3/uL (ref 4.0–10.5)

## 2014-08-28 LAB — CBC WITH DIFFERENTIAL/PLATELET
BASOS PCT: 1 % (ref 0–1)
Basophils Absolute: 0.1 10*3/uL (ref 0.0–0.1)
Eosinophils Absolute: 0.1 10*3/uL (ref 0.0–0.7)
Eosinophils Relative: 1 % (ref 0–5)
HCT: 36.4 % (ref 36.0–46.0)
Hemoglobin: 12.3 g/dL (ref 12.0–15.0)
LYMPHS ABS: 2.2 10*3/uL (ref 0.7–4.0)
Lymphocytes Relative: 26 % (ref 12–46)
MCH: 29 pg (ref 26.0–34.0)
MCHC: 33.8 g/dL (ref 30.0–36.0)
MCV: 85.8 fL (ref 78.0–100.0)
Monocytes Absolute: 0.8 10*3/uL (ref 0.1–1.0)
Monocytes Relative: 9 % (ref 3–12)
NEUTROS ABS: 5.4 10*3/uL (ref 1.7–7.7)
NEUTROS PCT: 63 % (ref 43–77)
PLATELETS: 125 10*3/uL — AB (ref 150–400)
RBC: 4.24 MIL/uL (ref 3.87–5.11)
RDW: 14.3 % (ref 11.5–15.5)
WBC: 8.6 10*3/uL (ref 4.0–10.5)

## 2014-08-28 SURGERY — Surgical Case
Anesthesia: Regional

## 2014-08-28 SURGERY — Surgical Case
Anesthesia: Spinal | Site: Abdomen

## 2014-08-28 MED ORDER — ONDANSETRON HCL 4 MG/2ML IJ SOLN
INTRAMUSCULAR | Status: AC
  Filled 2014-08-28: qty 2

## 2014-08-28 MED ORDER — PHENYLEPHRINE 8 MG IN D5W 100 ML (0.08MG/ML) PREMIX OPTIME
INJECTION | INTRAVENOUS | Status: DC | PRN
  Administered 2014-08-28: 60 ug/min via INTRAVENOUS

## 2014-08-28 MED ORDER — SCOPOLAMINE 1 MG/3DAYS TD PT72: 1.0000 | MEDICATED_PATCH | Freq: Once | TRANSDERMAL | Status: DC

## 2014-08-28 MED ORDER — KETOROLAC TROMETHAMINE 30 MG/ML IJ SOLN: 30.0000 mg | Freq: Four times a day (QID) | INTRAMUSCULAR | Status: AC | PRN

## 2014-08-28 MED ORDER — MENTHOL 3 MG MT LOZG: 1.0000 | LOZENGE | OROMUCOSAL | Status: DC | PRN

## 2014-08-28 MED ORDER — METOCLOPRAMIDE HCL 5 MG/ML IJ SOLN
10.0000 mg | Freq: Once | INTRAMUSCULAR | Status: AC
  Administered 2014-08-28: 10 mg via INTRAVENOUS

## 2014-08-28 MED ORDER — METOCLOPRAMIDE HCL 5 MG/ML IJ SOLN
INTRAMUSCULAR | Status: AC
  Administered 2014-08-28: 10 mg via INTRAVENOUS
  Filled 2014-08-28: qty 2

## 2014-08-28 MED ORDER — KETOROLAC TROMETHAMINE 15 MG/ML IJ SOLN
15.0000 mg | Freq: Once | INTRAMUSCULAR | Status: AC
  Administered 2014-08-28: 15 mg via INTRAVENOUS
  Filled 2014-08-28: qty 1

## 2014-08-28 MED ORDER — HYDROMORPHONE HCL 1 MG/ML IJ SOLN
INTRAMUSCULAR | Status: AC
  Filled 2014-08-28: qty 1

## 2014-08-28 MED ORDER — CEFAZOLIN SODIUM-DEXTROSE 2-3 GM-% IV SOLR
2.0000 g | INTRAVENOUS | Status: AC
  Administered 2014-08-28: 2 g via INTRAVENOUS

## 2014-08-28 MED ORDER — SODIUM CHLORIDE 0.9 % IJ SOLN: 3.0000 mL | INTRAMUSCULAR | Status: DC | PRN

## 2014-08-28 MED ORDER — LANOLIN HYDROUS EX OINT: 1.0000 "application " | TOPICAL_OINTMENT | CUTANEOUS | Status: DC | PRN

## 2014-08-28 MED ORDER — MIDAZOLAM HCL 2 MG/2ML IJ SOLN
INTRAMUSCULAR | Status: DC | PRN
  Administered 2014-08-28 (×2): 1 mg via INTRAVENOUS

## 2014-08-28 MED ORDER — SCOPOLAMINE 1 MG/3DAYS TD PT72
MEDICATED_PATCH | TRANSDERMAL | Status: AC
  Administered 2014-08-28: 1.5 mg via TRANSDERMAL
  Filled 2014-08-28: qty 1

## 2014-08-28 MED ORDER — CEFAZOLIN SODIUM-DEXTROSE 2-3 GM-% IV SOLR
2.0000 g | Freq: Three times a day (TID) | INTRAVENOUS | Status: AC
  Administered 2014-08-28 – 2014-08-29 (×2): 2 g via INTRAVENOUS
  Filled 2014-08-28 (×2): qty 50

## 2014-08-28 MED ORDER — PRENATAL MULTIVITAMIN CH: 1.0000 | ORAL_TABLET | Freq: Every day | ORAL | Status: DC

## 2014-08-28 MED ORDER — PROMETHAZINE HCL 25 MG/ML IJ SOLN: 6.2500 mg | INTRAMUSCULAR | Status: DC | PRN

## 2014-08-28 MED ORDER — NALOXONE HCL 0.4 MG/ML IJ SOLN: 0.4000 mg | INTRAMUSCULAR | Status: DC | PRN

## 2014-08-28 MED ORDER — SIMETHICONE 80 MG PO CHEW
80.0000 mg | CHEWABLE_TABLET | Freq: Three times a day (TID) | ORAL | Status: DC
  Administered 2014-08-28 – 2014-08-30 (×5): 80 mg via ORAL
  Filled 2014-08-28 (×5): qty 1

## 2014-08-28 MED ORDER — NALOXONE HCL 1 MG/ML IJ SOLN
1.0000 ug/kg/h | INTRAVENOUS | Status: DC | PRN
  Filled 2014-08-28: qty 2

## 2014-08-28 MED ORDER — OXYTOCIN 40 UNITS IN LACTATED RINGERS INFUSION - SIMPLE MED: 62.5000 mL/h | INTRAVENOUS | Status: AC

## 2014-08-28 MED ORDER — CEFAZOLIN SODIUM-DEXTROSE 2-3 GM-% IV SOLR
INTRAVENOUS | Status: AC
  Filled 2014-08-28: qty 50

## 2014-08-28 MED ORDER — TETANUS-DIPHTH-ACELL PERTUSSIS 5-2.5-18.5 LF-MCG/0.5 IM SUSP: 0.5000 mL | Freq: Once | INTRAMUSCULAR | Status: DC

## 2014-08-28 MED ORDER — WITCH HAZEL-GLYCERIN EX PADS: 1.0000 "application " | MEDICATED_PAD | CUTANEOUS | Status: DC | PRN

## 2014-08-28 MED ORDER — SCOPOLAMINE 1 MG/3DAYS TD PT72
1.0000 | MEDICATED_PATCH | Freq: Once | TRANSDERMAL | Status: DC
  Administered 2014-08-28: 1.5 mg via TRANSDERMAL

## 2014-08-28 MED ORDER — LACTATED RINGERS IV SOLN
INTRAVENOUS | Status: DC
  Administered 2014-08-28 (×4): via INTRAVENOUS

## 2014-08-28 MED ORDER — SIMETHICONE 80 MG PO CHEW: 80.0000 mg | CHEWABLE_TABLET | ORAL | Status: DC | PRN

## 2014-08-28 MED ORDER — MEPERIDINE HCL 25 MG/ML IJ SOLN: 6.2500 mg | INTRAMUSCULAR | Status: DC | PRN

## 2014-08-28 MED ORDER — ONDANSETRON HCL 4 MG/2ML IJ SOLN: 4.0000 mg | Freq: Three times a day (TID) | INTRAMUSCULAR | Status: DC | PRN

## 2014-08-28 MED ORDER — MORPHINE SULFATE (PF) 0.5 MG/ML IJ SOLN
INTRAMUSCULAR | Status: DC | PRN
  Administered 2014-08-28: .15 mg via INTRATHECAL

## 2014-08-28 MED ORDER — OXYTOCIN 10 UNIT/ML IJ SOLN
INTRAMUSCULAR | Status: AC
  Filled 2014-08-28: qty 4

## 2014-08-28 MED ORDER — DIBUCAINE 1 % RE OINT
1.0000 | TOPICAL_OINTMENT | RECTAL | Status: DC | PRN
Start: 2014-08-28 — End: 2014-08-30

## 2014-08-28 MED ORDER — FENTANYL CITRATE 0.05 MG/ML IJ SOLN
INTRAMUSCULAR | Status: AC
  Filled 2014-08-28: qty 2

## 2014-08-28 MED ORDER — IBUPROFEN 600 MG PO TABS
600.0000 mg | ORAL_TABLET | Freq: Four times a day (QID) | ORAL | Status: DC
  Administered 2014-08-28 – 2014-08-30 (×8): 600 mg via ORAL
  Filled 2014-08-28 (×8): qty 1

## 2014-08-28 MED ORDER — HYDROMORPHONE HCL 2 MG PO TABS
2.0000 mg | ORAL_TABLET | ORAL | Status: DC | PRN
  Administered 2014-08-29 – 2014-08-30 (×2): 2 mg via ORAL
  Filled 2014-08-28 (×3): qty 1

## 2014-08-28 MED ORDER — SCOPOLAMINE 1 MG/3DAYS TD PT72: 1.0000 | MEDICATED_PATCH | TRANSDERMAL | Status: DC

## 2014-08-28 MED ORDER — ONDANSETRON HCL 4 MG PO TABS
4.0000 mg | ORAL_TABLET | ORAL | Status: DC | PRN
  Administered 2014-08-29 – 2014-08-30 (×2): 4 mg via ORAL
  Filled 2014-08-28 (×2): qty 1

## 2014-08-28 MED ORDER — SIMETHICONE 80 MG PO CHEW
80.0000 mg | CHEWABLE_TABLET | ORAL | Status: DC
  Administered 2014-08-29 (×2): 80 mg via ORAL
  Filled 2014-08-28 (×2): qty 1

## 2014-08-28 MED ORDER — ONDANSETRON HCL 4 MG/2ML IJ SOLN: 4.0000 mg | INTRAMUSCULAR | Status: DC | PRN

## 2014-08-28 MED ORDER — MORPHINE SULFATE 0.5 MG/ML IJ SOLN
INTRAMUSCULAR | Status: AC
  Filled 2014-08-28: qty 10

## 2014-08-28 MED ORDER — MEASLES, MUMPS & RUBELLA VAC ~~LOC~~ INJ
0.5000 mL | INJECTION | Freq: Once | SUBCUTANEOUS | Status: DC
  Filled 2014-08-28: qty 0.5

## 2014-08-28 MED ORDER — HYDROMORPHONE HCL 1 MG/ML IJ SOLN
INTRAMUSCULAR | Status: DC | PRN
  Administered 2014-08-28: 1 mg via INTRAVENOUS

## 2014-08-28 MED ORDER — MIDAZOLAM HCL 2 MG/2ML IJ SOLN
INTRAMUSCULAR | Status: AC
  Filled 2014-08-28: qty 2

## 2014-08-28 MED ORDER — BUPIVACAINE IN DEXTROSE 0.75-8.25 % IT SOLN
INTRATHECAL | Status: DC | PRN
  Administered 2014-08-28: 1.6 mL via INTRATHECAL

## 2014-08-28 MED ORDER — ONDANSETRON HCL 4 MG/2ML IJ SOLN
INTRAMUSCULAR | Status: DC | PRN
  Administered 2014-08-28: 4 mg via INTRAVENOUS

## 2014-08-28 MED ORDER — PHENYLEPHRINE 8 MG IN D5W 100 ML (0.08MG/ML) PREMIX OPTIME
INJECTION | INTRAVENOUS | Status: AC
  Filled 2014-08-28: qty 100

## 2014-08-28 MED ORDER — HYDROMORPHONE HCL 1 MG/ML IJ SOLN: 0.2500 mg | INTRAMUSCULAR | Status: DC | PRN

## 2014-08-28 MED ORDER — LACTATED RINGERS IV SOLN
INTRAVENOUS | Status: DC
  Administered 2014-08-28 – 2014-08-29 (×2): via INTRAVENOUS

## 2014-08-28 MED ORDER — OXYTOCIN 40 UNITS IN LACTATED RINGERS INFUSION - SIMPLE MED
INTRAVENOUS | Status: DC | PRN
  Administered 2014-08-28: 40 [IU] via INTRAVENOUS

## 2014-08-28 MED ORDER — KETOROLAC TROMETHAMINE 30 MG/ML IJ SOLN: 15.0000 mg | Freq: Once | INTRAMUSCULAR | Status: DC | PRN

## 2014-08-28 MED ORDER — KETOROLAC TROMETHAMINE 30 MG/ML IJ SOLN
INTRAMUSCULAR | Status: AC
  Filled 2014-08-28: qty 1

## 2014-08-28 SURGICAL SUPPLY — 27 items
CLAMP CORD UMBIL (MISCELLANEOUS) ×4 IMPLANT
CLOTH BEACON ORANGE TIMEOUT ST (SAFETY) ×2 IMPLANT
DRAPE SHEET LG 3/4 BI-LAMINATE (DRAPES) ×2 IMPLANT
DRSG OPSITE POSTOP 4X10 (GAUZE/BANDAGES/DRESSINGS) ×2 IMPLANT
DRSG VASELINE 3X18 (GAUZE/BANDAGES/DRESSINGS) ×2 IMPLANT
DURAPREP 26ML APPLICATOR (WOUND CARE) ×2 IMPLANT
ELECT REM PT RETURN 9FT ADLT (ELECTROSURGICAL) ×2
ELECTRODE REM PT RTRN 9FT ADLT (ELECTROSURGICAL) ×1 IMPLANT
GAUZE VASELINE 3X9 (GAUZE/BANDAGES/DRESSINGS) ×2 IMPLANT
GLOVE BIO SURGEON STRL SZ7.5 (GLOVE) ×4 IMPLANT
GLOVE SURG SS PI 7.0 STRL IVOR (GLOVE) ×12 IMPLANT
GOWN STRL REUS W/TWL LRG LVL3 (GOWN DISPOSABLE) ×8 IMPLANT
NS IRRIG 1000ML POUR BTL (IV SOLUTION) ×2 IMPLANT
PACK C SECTION WH (CUSTOM PROCEDURE TRAY) ×2 IMPLANT
PAD OB MATERNITY 4.3X12.25 (PERSONAL CARE ITEMS) ×2 IMPLANT
RTRCTR C-SECT PINK 25CM LRG (MISCELLANEOUS) ×2 IMPLANT
STAPLER VISISTAT 35W (STAPLE) ×2 IMPLANT
SUT PLAIN 0 NONE (SUTURE) IMPLANT
SUT VIC AB 0 CT1 36 (SUTURE) ×16 IMPLANT
SUT VIC AB 3-0 CTX 36 (SUTURE) ×2 IMPLANT
SUT VIC AB 3-0 SH 27 (SUTURE) ×3
SUT VIC AB 3-0 SH 27X BRD (SUTURE) ×3 IMPLANT
SUT VIC AB 4-0 KS 27 (SUTURE) IMPLANT
TAPE CLOTH SURG 4X10 WHT LF (GAUZE/BANDAGES/DRESSINGS) ×2 IMPLANT
TOWEL OR 17X24 6PK STRL BLUE (TOWEL DISPOSABLE) ×6 IMPLANT
TRAY FOLEY CATH 14FR (SET/KITS/TRAYS/PACK) ×2 IMPLANT
WATER STERILE IRR 1000ML POUR (IV SOLUTION) ×2 IMPLANT

## 2014-08-28 NOTE — Anesthesia Postprocedure Evaluation (Signed)
  Anesthesia Post-op Note  Anesthesia Post Note  Patient: Valerie Cortez  Procedure(s) Performed: Procedure(s) (LRB): REPEAT CESAREAN SECTION (N/A)  Anesthesia type: Spinal  Patient location: PACU  Post pain: Pain level controlled  Post assessment: Post-op Vital signs reviewed  Last Vitals:  Filed Vitals:   08/28/14 1130  BP: 104/43  Pulse: 74  Temp: 36.9 C  Resp: 16    Post vital signs: Reviewed  Level of consciousness: awake  Complications: No apparent anesthesia complications

## 2014-08-28 NOTE — Transfer of Care (Signed)
Immediate Anesthesia Transfer of Care Note  Patient: Valerie Cortez  Procedure(s) Performed: Procedure(s): REPEAT CESAREAN SECTION (N/A)  Patient Location: PACU  Anesthesia Type:Spinal  Level of Consciousness: awake, alert  and oriented  Airway & Oxygen Therapy: Patient Spontanous Breathing  Post-op Assessment: Report given to PACU RN and Post -op Vital signs reviewed and stable  Post vital signs: Reviewed and stable  Complications: No apparent anesthesia complications

## 2014-08-28 NOTE — Progress Notes (Signed)
Patient ID: Valerie Cortez, female   DOB: [birth year], 23 y.o.   MRN: [id number] I examined this lady 08-26-14 and she reports no change in her health since that time

## 2014-08-28 NOTE — H&P (Signed)
NAME:  Valerie Cortez, Valerie Cortez                    ACCOUNT NO.:  [id number]  MEDICAL RECORD NO.:  [id number]  LOCATION:                                 FACILITY:  PHYSICIAN:  Malachi Prohomas F. Ambrose MantleHenley, M.D. DATE OF BIRTH:  [birth year]  DATE OF ADMISSION: DATE OF DISCHARGE:                             HISTORY & PHYSICAL   HISTORY OF PRESENT ILLNESS:  This is a 23 year old white female, para 1- 0-0-1, gravida 2, EDC September 03, 2014, admitted for repeat cesarean section.  Ultrasound on February 11, 2014, placed her age of the baby at 10 weeks and 6 days [facility].  Blood group and type AB positive, negative antibody, RPR negative, urine culture negative, hepatitis B surface antigen negative, HIV negative, GC and Chlamydia negative, varicella immune, rubella immune.  Pap test normal.  First trimester screen negative.  AFP negative.  One-hour Glucola 147, 3-hour GTT 75, 115, and 78.  Repeat HIV and RPR negative, GC and Chlamydia negative.  Group B strep negative.  The patient began her prenatal course at 10 weeks.  She declined vaginal birth after cesarean and was given forms in case she decided she wanted the VBAC.  Subsequently, at 30 weeks, she decided she wanted the C-section.  The patient's prenatal course was uncomplicated.  She had a normal blood pressure throughout. Total weight gain was 26 pounds.  She was admitted now for repeat C- section.  PAST MEDICAL HISTORY:  Eczema since she was a baby.  She says she has had Crohn disease and ulcerative colitis, irritable bowel syndrome.  She did have gallstone pancreatitis in January 2014.  She has had migraines in 2010.  Fractured her right arm at age 23 and her left middle finger. Had asthma as a child.  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  ALLERGIES:  PEANUTS CAUSE HIVES, RED FOOD COLOR CAUSES VOMITING AND HIVES.  No known drug allergies.  No latex allergies.  PAST SURGICAL HISTORY:   Wisdom teeth extraction at age 23, cholecystectomy in January 2014, C-section in November 2013, for partially prolapsed cord and that the cord was not in the vagina, but could be palpated through the cervix.  GYN HISTORY:  Unremarkable except she did have chlamydia in April 2013 and October 2013.  FAMILY HISTORY:  Brother with asthma.  Mother with cerebral palsy, diverticulitis, uterine fibroids, and migraines.  Maternal grandfather with COPD, diabetes, and high blood pressure.  Paternal grandfather with cancer of the lung and maternal grandmother with cancer of the ovary.  SOCIAL HISTORY:  Never smoked.  Does not drink.  No drugs.  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  She is married. Her husband is a MidwifeDeputy Sheriff.  PHYSICAL EXAMINATION:  Done on August 26, 2014. GENERAL:  Well-developed, well-nourished white female, in no distress. Blood pressure 118/72, pulse 80. HEART:  Normal size and sounds.  No murmurs. LUNGS:  Clear to auscultation.  Fundal height 40.5 cm.  Fetal heart tones normal.  Cervix 0 cm, 0% effaced, and presenting part at a -4.  ADMITTING IMPRESSION:  Intrauterine pregnancy at 39 weeks for repeat C- section.  The patient understands the risks involved and is ready to proceed.     Malachi Prohomas F. Ambrose MantleHenley, M.D.     TFH/MEDQ  D:  08/27/2014  T:  08/27/2014  Job:  [id number]

## 2014-08-28 NOTE — Lactation Note (Signed)
This note was copied from the chart of Valerie Cortez. Lactation Consultation Note  Patient Name: Valerie Prentiss Bellsara [name]'UToday's Date: 08/28/2014 Reason for consult: Initial assessment of this second-time breastfeeding mom and her newborn at 12 hours postpartum.  Mom has older child who is almost 24 yo.  Mom states she nursed him for 3 months.  Her newborn has been latching well at most feedings but recently caused some soreness on (R) side with a shallow latch.  LC examined nipples which are everted and slightly pink but no signs of trauma.  LC reviewed hand expression and ebm for nipple care, as well as varying positions for breastfeeding and starting on opposite breast for a few feedings if (R) is more sore.  LC encouraged cue feedings and frequent STS.  Mom says she knows how to hand express her colostrum/milk.Mom encouraged to feed baby 8-12 times/24 hours and with feeding cues. LC encouraged review of Baby and Me pp 9, 14 and 20-25 for STS and BF information. LC provided [employer] Resource brochure and reviewed [facility] services and list of community and web site resources.    Maternal Data Formula Feeding for Exclusion: No Has patient been taught Hand Expression?: Yes (mom says she knows how to hand express her colostrum) Does the patient have breastfeeding experience prior to this delivery?: Yes  Feeding    LATCH Score/Interventions            Most recent LATCH score=9 per RN assessment          Lactation Tools Discussed/Used   STS, hand expression, cue feedings ebm for nipple care  Consult Status Consult Status: Follow-up Date: 08/29/14 Follow-up type: In-patient    [name], Keshon Markovitz [facility] 08/28/2014, 9:48 PM

## 2014-08-28 NOTE — Op Note (Signed)
NAME:  Valerie Cortez, Valerie Cortez                    ACCOUNT NO.:  [id number]  MEDICAL RECORD NO.:  [id number]  LOCATION:  [facility]                          FACILITY:  [facility]  PHYSICIAN:  Malachi Prohomas F. Ambrose MantleHenley, M.D. DATE OF BIRTH:  [birth year]  DATE OF PROCEDURE:  08/28/2014 DATE OF DISCHARGE:                              OPERATIVE REPORT   PREOPERATIVE DIAGNOSIS:  Intrauterine pregnancy at 39+ weeks, prior C- section, declined VBAC.  POSTOPERATIVE DIAGNOSIS:  Intrauterine pregnancy at 39+ weeks, prior C- section, declined VBAC.  OPERATION:  Low-transverse cervical C-section.  OPERATOR:  Malachi Prohomas F. Ambrose MantleHenley, MD.  ASSISTANEllyn Hack:  Bovard.  ANESTHESIA:  Spinal by Dr. Malen GauzeFoster.  DESCRIPTION OF PROCEDURE:  The patient was brought to the operating room and given a spinal anesthetic by Dr. Malen GauzeFoster.  She was placed in supine and in the left lateral position.  The nurse prep the urethra, inserted a Foley catheter and then prepped the abdomen with DuraPrep.  During the 3-minute delay, we did a time out.  There were no other questions after 3 minutes of drying of the DuraPrep.  The abdomen was draped as a sterile field.  Anesthesia was confirmed by pinching the lower abdomen with an Allis.  A transverse incision was made through the skin, subcutaneous tissue and fascia transversely through the old scar.  The fascia was then separated from the rectus muscles superiorly and inferiorly.  The peritoneum was entered.  The incision was enlarged with sharp dissection and then was stretched to provide more exposure.  There were no adhesions encountered.  An Alexis retractor was placed.  It was pulled down and then the lower uterine segment was exposed, a 2-inch transverse incision was made through the superficial layers of the myometrium.  I entered the amniotic sac with my finger, spilling clear fluid, the ear was visible, I lifted the vertex into the operative field, delivered the baby without any difficulty, clamped and cut  the cord and Dr. Ellyn HackBovard gave the baby to Dr. Eric FormWimmer, the neonatologist who was in attendance.  He assigned the female infant Apgars of 9 and 10 at 1 and 5 minutes.  Cord blood collection lady was present, so I removed the placenta and gave the placenta to the cord blood collection lady.  I inspected the inside of the uterus, found it to be free of any remaining products of conception.  I dilated the cervix with a long ring forceps. I had noticed that there was a maybe a 1 cm extension of the transverse incision toward the vagina close to the right angle.  I made sure that in the closure, I incorporated this, there was a small hematoma that formed at the right uterine angle, but this did not progress any.  I closed the uterus with the second layer, locking the first layer and nonlocking the second layer, both sutures were 0 Vicryl.  There was no additional bleeding.  At this point, I inspected the left tube and ovary and found them to be normal.  I began to look for the right tube and ovary and the patient became nauseated and started vomiting, complaining of chest pain, and pushed the bowel out  on to the operative field.  This probably took 10-15 minutes to resolve.  When it did resolve, I completed the examination of the right tube and ovary which were normal. The uterus appeared normal.  The Alexis retractor was removed and the abdominal wall was closed in layers using interrupted sutures of 0 Vicryl on the rectus muscle and peritoneum in 1 layer.  Two running sutures of 0 Vicryl on the fascia, running 3-0 Vicryl on the subcutaneous tissue, and staples on the skin.  The patient seemed to tolerate the procedure well.  Blood loss was I estimated no more than a 1000 mL.  I do not have the anesthetist estimate.  Sponge and needle counts were correct and the patient was returned to recovery in satisfactory condition.     Malachi Prohomas F. Ambrose MantleHenley, M.D.     TFH/MEDQ  D:  08/28/2014  T:   08/28/2014  Job:  [id number]

## 2014-08-28 NOTE — Progress Notes (Signed)
Noted low blood pressure and notified physician. Orders received. Patient is asymptomatic, pulse 60-70's range.

## 2014-08-28 NOTE — Anesthesia Procedure Notes (Signed)
Spinal  Patient location during procedure: OR Start time: 08/28/2014 8:40 AM Staffing Anesthesiologist: Anyeli Hockenbury A. Performed by: anesthesiologist  Preanesthetic Checklist Completed: patient identified, site marked, surgical consent, pre-op evaluation, timeout performed, IV checked, risks and benefits discussed and monitors and equipment checked Spinal Block Patient position: sitting Prep: site prepped and draped and DuraPrep Patient monitoring: heart rate, cardiac monitor, continuous pulse ox and blood pressure Approach: midline Location: L4-5 Injection technique: single-shot Needle Needle type: Sprotte  Needle gauge: 24 G Needle length: 9 cm Needle insertion depth: 5 cm Assessment Sensory level: T4 Events: paresthesia Additional Notes Patient tolerated procedure well. Adequate sensory level.

## 2014-08-29 ENCOUNTER — Encounter (HOSPITAL_COMMUNITY): Payer: Self-pay | Admitting: Obstetrics and Gynecology

## 2014-08-29 LAB — CBC
HCT: 31.1 % — ABNORMAL LOW (ref 36.0–46.0)
Hemoglobin: 10.3 g/dL — ABNORMAL LOW (ref 12.0–15.0)
MCH: 28.6 pg (ref 26.0–34.0)
MCHC: 33.1 g/dL (ref 30.0–36.0)
MCV: 86.4 fL (ref 78.0–100.0)
PLATELETS: 121 10*3/uL — AB (ref 150–400)
RBC: 3.6 MIL/uL — ABNORMAL LOW (ref 3.87–5.11)
RDW: 14.4 % (ref 11.5–15.5)
WBC: 9.6 10*3/uL (ref 4.0–10.5)

## 2014-08-29 NOTE — Anesthesia Postprocedure Evaluation (Signed)
  Anesthesia Post-op Note  Patient: Valerie Cortez  Procedure(s) Performed: Procedure(s): REPEAT CESAREAN SECTION (N/A)  Patient Location: Mother/Baby  Anesthesia Type:Spinal  Level of Consciousness: awake, alert , oriented and patient cooperative  Airway and Oxygen Therapy: Patient Spontanous Breathing  Post-op Pain: none  Post-op Assessment: Post-op Vital signs reviewed, Patient's Cardiovascular Status Stable, Respiratory Function Stable, Patent Airway, No signs of Nausea or vomiting, Adequate PO intake, Pain level controlled, No headache, No backache, No residual numbness and No residual motor weakness  Post-op Vital Signs: Reviewed and stable  Last Vitals:  Filed Vitals:   08/29/14 0500  BP: 103/61  Pulse: 71  Temp: 36.9 C  Resp: 18    Complications: No apparent anesthesia complications

## 2014-08-29 NOTE — Lactation Note (Signed)
This note was copied from the chart of Valerie Cortez. Lactation Consultation Note    Follow up consult with this experienced breast feeding mom  of a term baby, now 3829 hours old. Mom reports breast  feeding going well, baby has had multiple    Voids and stools, and mom has not breast/nipple problems. Mom knows to call for questions/concerns  Patient Name: Valerie Cortez [id number]'s Date: 08/29/2014 Reason for consult: Follow-up assessment   Maternal Data    Feeding Feeding Type: Breast Fed  LATCH Score/Interventions                      Lactation Tools Discussed/Used     Consult Status Consult Status: Complete Follow-up type: Call as needed    Alfred LevinsLee, Brennah Quraishi Anne 08/29/2014, 2:01 PM

## 2014-08-29 NOTE — Addendum Note (Signed)
Addendum created 08/29/14 0759 by Suella Groveoderick C Daniyla Pfahler, CRNA   Modules edited: Notes Section   Notes Section:  File: [id number]

## 2014-08-29 NOTE — Progress Notes (Signed)
Patient ID: Valerie Cortez, female   DOB: Sep 19, 1991, 23 y.o.   MRN: [id number] #1  Afebrile BP normal no problems HGB stable

## 2014-08-30 MED ORDER — HYDROMORPHONE HCL 2 MG PO TABS
1.0000 mg | ORAL_TABLET | ORAL | Status: DC | PRN
Start: 2014-08-30 — End: 2018-10-02

## 2014-08-30 MED ORDER — ONDANSETRON HCL 4 MG PO TABS: 4.0000 mg | ORAL_TABLET | Freq: Four times a day (QID) | ORAL | Status: DC | PRN

## 2014-08-30 MED ORDER — IBUPROFEN 600 MG PO TABS: 600.0000 mg | ORAL_TABLET | Freq: Four times a day (QID) | ORAL | Status: DC | PRN

## 2014-08-30 NOTE — Discharge Summary (Signed)
NAME:  Prentiss BellsCOX, Valerie Cortez                    ACCOUNT NO.:  [id number]  MEDICAL RECORD NO.:  [id number]  LOCATION:  9120                          FACILITY:  [facility]  PHYSICIAN:  Malachi Prohomas F. Ambrose MantleHenley, M.D. DATE OF BIRTH:  07-18-1991  DATE OF ADMISSION:  08/28/2014 DATE OF DISCHARGE:  08/30/2014                              DISCHARGE SUMMARY   HISTORY OF PRESENT ILLNESS:  This is a 23 year old white female para 1-0- 0-1, gravida 2, EDC September 03, 2014, admitted for repeat C-section. All her prenatal labs were normal.  She had an uncomplicated prenatal course and was admitted to the hospital for repeat C-section at her request.  She underwent a low-transverse cervical C-section by Dr. Ambrose MantleHenley with Dr. Ellyn HackBovard assisting under spinal anesthesia by Dr. Malen GauzeFoster. Procedure was uncomplicated except for problem with vomiting during the procedure that caused the small intestine to extrude through the incision.  This took several minutes to resolve, but after that the procedure was uncomplicated.  Postoperatively, she rapidly regained normal function.  She ambulated well, tolerated a regular diet, passed flatus, had bowel movements, voided well, and was ready for discharge on the second postpartum day.  Initial hemoglobin was 12.3, hematocrit 36.4, white count 8600, platelet count 125,000.  Followup hemoglobin done on 2 occasions was 10.3 on both occasions, platelet count 117 and 121.  FINAL DIAGNOSES:  Intrauterine pregnancy at 39 weeks, delivered by C- section.  Prior C-section declined VBAC.  OPERATION:  Low-transverse cervical C-section.  FINAL CONDITION:  Improved.  INSTRUCTIONS:  Include our regular discharge instruction booklet, the after visit summary, and prescriptions for Motrin 600 mg 30 tablets, 1 every 6 hours as needed for pain.  The patient is advised to make sure she gets Motrin that does not have red dye, Dilaudid 1 mg every 6 hours as needed, they will come in 2 mg tablets, take 1-1/2 tablet,  and take Zofran 4 mg as needed for nausea.  She is to return to the office in 5 days to have her staples removed.  She was advised to keep her incision covered until they incise.     Malachi Prohomas F. Ambrose MantleHenley, M.D.     TFH/MEDQ  D:  08/30/2014  T:  08/30/2014  Job:  [id number]

## 2014-08-30 NOTE — Plan of Care (Signed)
Problem: Discharge Progression Outcomes Goal: Remove staples per MD order Outcome: Not Applicable Date Met:  35/82/51 Staples to be removed in MD office

## 2014-08-30 NOTE — Progress Notes (Signed)
Patient ID: Valerie Cortez, female   DOB: 12-04-1990, 23 y.o.   MRN: [id number] #2 afebrile BP normal Tolerating diet, has had BM's, voiding and ambulating well For d/c

## 2014-08-30 NOTE — Lactation Note (Signed)
This note was copied from the chart of Valerie Cortez. Lactation Consultation Note  Patient Name: Valerie Cortez Reason for consult: Follow-up assessment  Infant is 50 hrs old and has breastfed x8 (10-60 ml); voids-5 in 24 hrs/ 10 life; stools - 6 in 24 hrs/ 13 life.  Mom reported breastfeeding is going well and has no complaints.  Mom had questions regarding preventing mastitis (history of mastitis with first child).  Engorgement prevention discussed and mastitis prevention, S&S of mastitis and when to call MD.  Mom has DEBP at home.  Mom aware of outpatient support and informed of hospital support group.  Encouraged to call for questions after discharge as needed.    Consult Status Consult Status: Complete    Lendon KaVann, Evony Rezek Walker Cortez, 11:51 AM

## 2014-08-30 NOTE — Discharge Instructions (Signed)
booklet °

## 2014-08-31 ENCOUNTER — Inpatient Hospital Stay (HOSPITAL_COMMUNITY)
Admission: AD | Admit: 2014-08-31 | Discharge: 2014-08-31 | Disposition: A | Discharge status: Home / Self Care | Payer: BC Managed Care – PPO | Source: Ambulatory Visit | Attending: Obstetrics and Gynecology | Admitting: Obstetrics and Gynecology

## 2014-08-31 DIAGNOSIS — O9089 Other complications of the puerperium, not elsewhere classified: Secondary | ICD-10-CM | POA: Diagnosis not present

## 2014-08-31 DIAGNOSIS — T8189XA Other complications of procedures, not elsewhere classified, initial encounter: Secondary | ICD-10-CM | POA: Diagnosis not present

## 2014-08-31 NOTE — Discharge Instructions (Signed)
Incision Care ° An incision is a surgical cut to open your skin. You need to take care of your incision. This helps you to not get an infection. °HOME CARE °· Only take medicine as told by your doctor. °· Do not take off your bandage (dressing) or get your incision wet until your doctor approves. Change the bandage and call your doctor if the bandage gets wet, dirty, or starts to smell. °· Take showers. Do not take baths, swim, or do anything that may soak your incision until it heals. °· Return to your normal diet and activities as told or allowed by your doctor. °· Avoid lifting any weight until your doctor approves. °· Put medicine that helps lessen itching on your incision as told by your doctor. Do not pick or scratch at your incision. °· Keep your doctor visit to have your stitches (sutures) or staples removed. °· Drink enough fluids to keep your pee (urine) clear or pale yellow. °GET HELP RIGHT AWAY IF: °· You have a fever. °· You have a rash. °· You are dizzy, or you pass out (faint) while standing. °· You have trouble breathing. °· You have a reaction or side effects to medicine given to you. °· You have redness, puffiness (swelling), or more pain in the incision and medicine does not help. °· You have fluid, blood, or yellowish-white fluid (pus) coming from the incision lasting over 1 day. °· You have muscle aches, chills, or you feel sick. °· You have a bad smell coming from the incision or bandage. °· Your incision opens up after stitches, staples, or sticky strips have been removed. °· You keep feeling sick to your stomach (nauseous) or keep throwing up (vomiting). °MAKE SURE YOU:  °· Understand these instructions. °· Will watch your condition. °· Will get help right away if you are not doing well or get worse. °Document Released: 01/24/2012 Document Reviewed: 12/26/2013 °Exit[employer]® Patient Information ©2015 [employer]. This information is not intended to replace advice given to you by your health  care provider. Make sure you discuss any questions you have with your health care provider. ° ° ° ° °

## 2014-08-31 NOTE — MAU Provider Note (Signed)
History     CSN: [id number]  Arrival date and time: 08/31/14 1522   First Provider Initiated Contact with Patient 08/31/14 1549      Chief Complaint  Patient presents with  . Incisional Pain   HPIShe is here for incisional bleeding  OB History   Grav Para Term Preterm Abortions TAB SAB Ect Mult Living   2 2 2       2       Past Medical History  Diagnosis Date  . Constipation     treated with diet change  . Hx of chlamydia infection     resolved  . Pancreatitis     History - resolved 12/2012  . Anemia   . Hypotension     Hx prior to first preg 09/2012, no longer an issue per patient  . Crohn's disease     managed by diet control  . Asthma     as a child - does not use inhaler  . Pneumonia     HX   . GERD (gastroesophageal reflux disease)     diet controlled - no meds  . Headache(784.0)     Hx last migraine 2 yrs ago  . Difficult intubation     Past Surgical History  Procedure Laterality Date  . Wisdom tooth extraction    . Cesarean section  10/04/2012    Procedure: CESAREAN SECTION;  Surgeon: Bing Plumehomas F Janey Petron, MD;  Location: [facility];  Service: Obstetrics;  Laterality: N/A;  . Cholecystectomy  12/02/2012    Procedure: LAPAROSCOPIC CHOLECYSTECTOMY WITH INTRAOPERATIVE CHOLANGIOGRAM;  Surgeon: Romie LeveeAlicia Ivar Domangue, MD;  Location: [facility];  Service: General;  Laterality: N/A;  . Ercp  12/04/2012    Procedure: ENDOSCOPIC RETROGRADE CHOLANGIOPANCREATOGRAPHY (ERCP);  Surgeon: Barrie FolkJohn C Hayes, MD;  [facility];  Service: Endoscopy;  Laterality: N/A;  . Ercp  12/05/2012    Procedure: ENDOSCOPIC RETROGRADE CHOLANGIOPANCREATOGRAPHY (ERCP);  Surgeon: Barrie FolkJohn C Hayes, MD;  [facility];  Service: Endoscopy;  Laterality: N/A;  . Eus N/A 01/24/2013    Procedure: ESOPHAGEAL ENDOSCOPIC ULTRASOUND (EUS) RADIAL;  Surgeon: Willis ModenaWilliam Outlaw, MD;  Location: [facility];  Service: Endoscopy;  Laterality: N/A;  . Cesarean section N/A 08/28/2014    Procedure: REPEAT CESAREAN SECTION;   Surgeon: Bing Plumehomas F Bell Carbo, MD;  Location: [facility];  Service: Obstetrics;  Laterality: N/A;    Family History  Problem Relation Age of Onset  . Cerebral palsy Mother   . Fibroids Mother   . Diverticulitis Mother   . Migraines Mother   . Asthma Brother   . Cancer Maternal Grandmother     ovarian  . COPD Maternal Grandfather   . Diabetes Maternal Grandfather   . Heart disease Maternal Grandfather     heart valve  . Hypertension Maternal Grandfather   . Cancer Paternal Grandfather     lung    History  Substance Use Topics  . Smoking status: Never Smoker   . Smokeless tobacco: Never Used  . Alcohol Use: No    Allergies:  Allergies  Allergen Reactions  . Fentanyl Itching, Rash and Other (See Comments)    "flushed" feeling  . Benadryl [Diphenhydramine Hcl]     "wakes me up"  . Oxycodone     Rash and Hives  . Peanut-Containing Drug Products Hives  . Red Dye Nausea And Vomiting    Red food coloring    Prescriptions prior to admission  Medication Sig Dispense Refill  . HYDROmorphone (DILAUDID) 2 MG tablet Take 0.5 tablets (  1 mg total) by mouth every 4 (four) hours as needed for severe pain.  30 tablet  0  . ibuprofen (ADVIL,MOTRIN) 600 MG tablet Take 1 tablet (600 mg total) by mouth every 6 (six) hours as needed.  30 tablet  0  . ondansetron (ZOFRAN) 4 MG tablet Take 1 tablet (4 mg total) by mouth 4 (four) times daily as needed for nausea.  20 tablet  0  . Prenatal Vit-Fe Fumarate-FA (PRENATAL MULTIVITAMIN) TABS tablet Take 1 tablet by mouth daily at 12 noon.      Pt is 4 days s/p c section She noted blood coming from her incision and it ran into her pubic area because the honeycomb dressing was not secured to the skin inferiorly  ROS negative Physical Exam   Blood pressure 131/74, pulse 58, resp. rate 16, currently breastfeeding. The incision is completely intact and the staples are present. There is no active bleeding   Physical Exam  MAU Course  Procedures The  dressing was removed and another dressing will be applied by the RN and pressure applied.  MDM   Assessment and Plan  The pt is advised to apply pressure if bleeding recurs. She is advised there is no evidence of infection and the bleeding quite likely came from a liquefied clot in the subcutaneous tissue. She is to see me 1 week after the c section to remove the staples.   Nycole Kawahara F 08/31/2014, 4:02 PM

## 2014-08-31 NOTE — MAU Note (Signed)
Redressed incision with Non-adherant pad, ABD pad applied to incision; Hypafix tape used.

## 2014-08-31 NOTE — MAU Note (Addendum)
Bleeding behind honeycomb dressing,  (1.5' x 1.5") area. C/o increased incisional pain; much worse this preg.  Breast feeding, milk just came in; baby is doing well

## 2014-08-31 NOTE — MAU Note (Signed)
Dr Ambrose MantleHenley at bedside.  Dressing removed.  Explained reason for bleeding.

## 2014-09-16 ENCOUNTER — Encounter (HOSPITAL_COMMUNITY): Payer: Self-pay | Admitting: Obstetrics and Gynecology

## 2014-11-28 ENCOUNTER — Encounter (HOSPITAL_COMMUNITY): Payer: Self-pay | Admitting: Pulmonary Disease

## 2014-12-13 ENCOUNTER — Other Ambulatory Visit: Payer: Self-pay | Admitting: Gastroenterology

## 2014-12-13 DIAGNOSIS — R1013 Epigastric pain: Secondary | ICD-10-CM

## 2014-12-20 ENCOUNTER — Ambulatory Visit
Admission: RE | Admit: 2014-12-20 | Discharge: 2014-12-20 | Disposition: A | Discharge status: Home / Self Care | Payer: BLUE CROSS/BLUE SHIELD | Source: Ambulatory Visit | Attending: Gastroenterology | Admitting: Gastroenterology

## 2014-12-20 DIAGNOSIS — R1013 Epigastric pain: Secondary | ICD-10-CM

## 2014-12-20 MED ORDER — IOHEXOL 300 MG/ML  SOLN
100.0000 mL | Freq: Once | INTRAMUSCULAR | Status: AC | PRN
  Administered 2014-12-20: 100 mL via INTRAVENOUS

## 2015-01-25 ENCOUNTER — Encounter (HOSPITAL_COMMUNITY): Payer: Self-pay | Admitting: Emergency Medicine

## 2015-01-25 ENCOUNTER — Emergency Department (HOSPITAL_COMMUNITY): Payer: BLUE CROSS/BLUE SHIELD

## 2015-01-25 ENCOUNTER — Emergency Department (HOSPITAL_COMMUNITY)
Admission: EM | Admit: 2015-01-25 | Discharge: 2015-01-25 | Disposition: A | Discharge status: Home / Self Care | Payer: BLUE CROSS/BLUE SHIELD | Attending: Emergency Medicine | Admitting: Emergency Medicine

## 2015-01-25 DIAGNOSIS — K219 Gastro-esophageal reflux disease without esophagitis: Secondary | ICD-10-CM | POA: Insufficient documentation

## 2015-01-25 DIAGNOSIS — Z8619 Personal history of other infectious and parasitic diseases: Secondary | ICD-10-CM | POA: Insufficient documentation

## 2015-01-25 DIAGNOSIS — R630 Anorexia: Secondary | ICD-10-CM | POA: Diagnosis not present

## 2015-01-25 DIAGNOSIS — Z862 Personal history of diseases of the blood and blood-forming organs and certain disorders involving the immune mechanism: Secondary | ICD-10-CM | POA: Insufficient documentation

## 2015-01-25 DIAGNOSIS — R1013 Epigastric pain: Secondary | ICD-10-CM | POA: Insufficient documentation

## 2015-01-25 DIAGNOSIS — J45901 Unspecified asthma with (acute) exacerbation: Secondary | ICD-10-CM | POA: Insufficient documentation

## 2015-01-25 DIAGNOSIS — Z79899 Other long term (current) drug therapy: Secondary | ICD-10-CM | POA: Diagnosis not present

## 2015-01-25 DIAGNOSIS — Z9049 Acquired absence of other specified parts of digestive tract: Secondary | ICD-10-CM | POA: Diagnosis not present

## 2015-01-25 DIAGNOSIS — Z8701 Personal history of pneumonia (recurrent): Secondary | ICD-10-CM | POA: Insufficient documentation

## 2015-01-25 DIAGNOSIS — Z3202 Encounter for pregnancy test, result negative: Secondary | ICD-10-CM | POA: Insufficient documentation

## 2015-01-25 DIAGNOSIS — R1011 Right upper quadrant pain: Secondary | ICD-10-CM | POA: Insufficient documentation

## 2015-01-25 DIAGNOSIS — R109 Unspecified abdominal pain: Secondary | ICD-10-CM

## 2015-01-25 DIAGNOSIS — I10 Essential (primary) hypertension: Secondary | ICD-10-CM | POA: Diagnosis not present

## 2015-01-25 LAB — CBC WITH DIFFERENTIAL/PLATELET
BASOS ABS: 0 10*3/uL (ref 0.0–0.1)
Basophils Relative: 1 % (ref 0–1)
EOS ABS: 0.1 10*3/uL (ref 0.0–0.7)
Eosinophils Relative: 2 % (ref 0–5)
HCT: 40.2 % (ref 36.0–46.0)
Hemoglobin: 13.5 g/dL (ref 12.0–15.0)
LYMPHS PCT: 40 % (ref 12–46)
Lymphs Abs: 2.7 10*3/uL (ref 0.7–4.0)
MCH: 28.4 pg (ref 26.0–34.0)
MCHC: 33.6 g/dL (ref 30.0–36.0)
MCV: 84.5 fL (ref 78.0–100.0)
Monocytes Absolute: 0.5 10*3/uL (ref 0.1–1.0)
Monocytes Relative: 7 % (ref 3–12)
NEUTROS PCT: 50 % (ref 43–77)
Neutro Abs: 3.5 10*3/uL (ref 1.7–7.7)
Platelets: 161 10*3/uL (ref 150–400)
RBC: 4.76 MIL/uL (ref 3.87–5.11)
RDW: 13.4 % (ref 11.5–15.5)
WBC: 6.9 10*3/uL (ref 4.0–10.5)

## 2015-01-25 LAB — URINALYSIS, ROUTINE W REFLEX MICROSCOPIC
Bilirubin Urine: NEGATIVE
GLUCOSE, UA: NEGATIVE mg/dL
Hgb urine dipstick: NEGATIVE
Ketones, ur: NEGATIVE mg/dL
Nitrite: NEGATIVE
PH: 6 (ref 5.0–8.0)
Protein, ur: NEGATIVE mg/dL
SPECIFIC GRAVITY, URINE: 1.009 (ref 1.005–1.030)
Urobilinogen, UA: 0.2 mg/dL (ref 0.0–1.0)

## 2015-01-25 LAB — COMPREHENSIVE METABOLIC PANEL
ALBUMIN: 4.5 g/dL (ref 3.5–5.2)
ALK PHOS: 114 U/L (ref 39–117)
ALT: 52 U/L — ABNORMAL HIGH (ref 0–35)
AST: 25 U/L (ref 0–37)
Anion gap: 6 (ref 5–15)
BUN: 10 mg/dL (ref 6–23)
CHLORIDE: 104 mmol/L (ref 96–112)
CO2: 25 mmol/L (ref 19–32)
CREATININE: 0.63 mg/dL (ref 0.50–1.10)
Calcium: 9.1 mg/dL (ref 8.4–10.5)
GFR calc Af Amer: >90 mL/min (ref >90)
GFR calc non Af Amer: >90 mL/min (ref >90)
Glucose, Bld: 86 mg/dL (ref 70–99)
Potassium: 3.3 mmol/L — ABNORMAL LOW (ref 3.5–5.1)
Sodium: 135 mmol/L (ref 135–145)
TOTAL PROTEIN: 7.6 g/dL (ref 6.0–8.3)
Total Bilirubin: 0.5 mg/dL (ref 0.3–1.2)

## 2015-01-25 LAB — AMYLASE: Amylase: 19 U/L (ref 0–105)

## 2015-01-25 LAB — URINE MICROSCOPIC-ADD ON

## 2015-01-25 LAB — POC URINE PREG, ED: Preg Test, Ur: NEGATIVE

## 2015-01-25 LAB — LIPASE, BLOOD: LIPASE: 35 U/L (ref 11–59)

## 2015-01-25 MED ORDER — ONDANSETRON HCL 4 MG PO TABS: 4.0000 mg | ORAL_TABLET | Freq: Four times a day (QID) | ORAL | Status: DC | PRN

## 2015-01-25 MED ORDER — TRAMADOL HCL 50 MG PO TABS
50.0000 mg | ORAL_TABLET | Freq: Four times a day (QID) | ORAL | Status: DC | PRN
Start: 2015-01-25 — End: 2018-10-02

## 2015-01-25 MED ORDER — PROMETHAZINE HCL 25 MG/ML IJ SOLN
25.0000 mg | Freq: Once | INTRAMUSCULAR | Status: AC
  Administered 2015-01-25: 25 mg via INTRAVENOUS
  Filled 2015-01-25: qty 1

## 2015-01-25 MED ORDER — HYDROMORPHONE HCL 1 MG/ML IJ SOLN
1.0000 mg | Freq: Once | INTRAMUSCULAR | Status: AC
  Administered 2015-01-25: 1 mg via INTRAVENOUS
  Filled 2015-01-25: qty 1

## 2015-01-25 MED ORDER — SODIUM CHLORIDE 0.9 % IV BOLUS (SEPSIS)
1000.0000 mL | Freq: Once | INTRAVENOUS | Status: AC
  Administered 2015-01-25: 1000 mL via INTRAVENOUS

## 2015-01-25 MED ORDER — ONDANSETRON HCL 4 MG/2ML IJ SOLN
4.0000 mg | Freq: Once | INTRAMUSCULAR | Status: AC
  Administered 2015-01-25: 4 mg via INTRAVENOUS
  Filled 2015-01-25: qty 2

## 2015-01-25 NOTE — ED Notes (Signed)
Pt alert, oriented, and  Ambulatory upon DC. She was advised to follow up with GI this week.

## 2015-01-25 NOTE — ED Notes (Signed)
Pt reports that she is having worsening upper abdominal pain but back pain is better after dilaudid

## 2015-01-25 NOTE — ED Notes (Signed)
Pt transported to XRAY °

## 2015-01-25 NOTE — ED Provider Notes (Signed)
CSN: [id number]     Arrival date & time 01/25/15  0010 History   First MD Initiated Contact with Patient 01/25/15 [phone]     Chief Complaint  Patient presents with  . Abdominal Pain     (Consider location/radiation/quality/duration/timing/severity/associated sxs/prior Treatment) Patient is a 24 y.o. female presenting with abdominal pain. The history is provided by the patient.  Abdominal Pain Pain location:  RUQ and LUQ Pain quality: aching   Pain radiates to:  Back Timing:  Constant Progression:  Worsening Chronicity:  New Context: suspicious food intake   Relieved by:  Nothing Worsened by:  Nothing tried Ineffective treatments:  None tried Associated symptoms: anorexia, fever and shortness of breath   Risk factors: multiple surgeries     Past Medical History  Diagnosis Date  . Constipation     treated with diet change  . Hx of chlamydia infection     resolved  . Pancreatitis     History - resolved 12/2012  . Anemia   . Hypotension     Hx prior to first preg 09/2012, no longer an issue per patient  . Crohn's disease     managed by diet control  . Asthma     as a child - does not use inhaler  . Pneumonia     HX   . GERD (gastroesophageal reflux disease)     diet controlled - no meds  . Headache(784.0)     Hx last migraine 2 yrs ago  . Difficult intubation    Past Surgical History  Procedure Laterality Date  . Wisdom tooth extraction    . Cesarean section  10/04/2012    Procedure: CESAREAN SECTION;  Surgeon: Bing Plumehomas F Henley, MD;  Location: [facility];  Service: Obstetrics;  Laterality: N/A;  . Cholecystectomy  12/02/2012    Procedure: LAPAROSCOPIC CHOLECYSTECTOMY WITH INTRAOPERATIVE CHOLANGIOGRAM;  Surgeon: Romie LeveeAlicia Thomas, MD;  Location: [facility];  Service: General;  Laterality: N/A;  . Ercp  12/04/2012    Procedure: ENDOSCOPIC RETROGRADE CHOLANGIOPANCREATOGRAPHY (ERCP);  Surgeon: Barrie FolkJohn C Hayes, MD;  [facility];  Service: Endoscopy;  Laterality: N/A;  . Ercp   12/05/2012    Procedure: ENDOSCOPIC RETROGRADE CHOLANGIOPANCREATOGRAPHY (ERCP);  Surgeon: Barrie FolkJohn C Hayes, MD;  [facility];  Service: Endoscopy;  Laterality: N/A;  . Eus N/A 01/24/2013    Procedure: ESOPHAGEAL ENDOSCOPIC ULTRASOUND (EUS) RADIAL;  Surgeon: Willis ModenaWilliam Outlaw, MD;  Location: [facility];  Service: Endoscopy;  Laterality: N/A;  . Cesarean section N/A 08/28/2014    Procedure: REPEAT CESAREAN SECTION;  Surgeon: Bing Plumehomas F Henley, MD;  Location: [facility];  Service: Obstetrics;  Laterality: N/A;   Family History  Problem Relation Age of Onset  . Cerebral palsy Mother   . Fibroids Mother   . Diverticulitis Mother   . Migraines Mother   . Asthma Brother   . Cancer Maternal Grandmother     ovarian  . COPD Maternal Grandfather   . Diabetes Maternal Grandfather   . Heart disease Maternal Grandfather     heart valve  . Hypertension Maternal Grandfather   . Cancer Paternal Grandfather     lung   History  Substance Use Topics  . Smoking status: Never Smoker   . Smokeless tobacco: Never Used  . Alcohol Use: No   OB History    Gravida Para Term Preterm AB TAB SAB Ectopic Multiple Living   2 2 2       2      Review of Systems  Constitutional: Positive  for fever.  Respiratory: Positive for shortness of breath.   Gastrointestinal: Positive for abdominal pain and anorexia.  All other systems reviewed and are negative.     Allergies  Fentanyl; Benadryl; Oxycodone; Peanut-containing drug products; and Red dye  Home Medications   Prior to Admission medications   Medication Sig Start Date End Date Taking? Authorizing Provider  omeprazole (PRILOSEC) 40 MG capsule Take 40 mg by mouth 2 (two) times daily.   Yes Historical Provider, MD  HYDROmorphone (DILAUDID) 2 MG tablet Take 0.5 tablets (1 mg total) by mouth every 4 (four) hours as needed for severe pain. Patient not taking: Reported on 01/25/2015 08/30/14   Tracey Harries, MD  ibuprofen (ADVIL,MOTRIN) 600 MG tablet Take 1  tablet (600 mg total) by mouth every 6 (six) hours as needed. Patient not taking: Reported on 01/25/2015 08/30/14   Tracey Harries, MD  ondansetron (ZOFRAN) 4 MG tablet Take 1 tablet (4 mg total) by mouth 4 (four) times daily as needed for nausea. Patient not taking: Reported on 01/25/2015 08/30/14   Tracey Harries, MD  Prenatal Vit-Fe Fumarate-FA (PRENATAL MULTIVITAMIN) TABS tablet Take 1 tablet by mouth daily at 12 noon.    Historical Provider, MD   BP 112/85 mmHg  Pulse 85  Temp(Src) 97.9 F (36.6 C) (Oral)  Resp 18  Ht  (1.575 m)  Wt 136 lb (61.689 kg)  BMI 24.87 kg/m2  SpO2 100%  LMP 12/27/2014 Physical Exam  Constitutional: She is oriented to person, place, and time. She appears well-developed and well-nourished.  HENT:  Head: Normocephalic and atraumatic.  Right Ear: External ear normal.  Left Ear: External ear normal.  Nose: Nose normal.  Mouth/Throat: Oropharynx is clear and moist.  Eyes: Conjunctivae and EOM are normal. Pupils are equal, round, and reactive to light.  Neck: Normal range of motion.  Cardiovascular: Normal rate and normal heart sounds.   Pulmonary/Chest: Effort normal.  Abdominal: Soft. She exhibits no distension. There is tenderness.  Tender right upper quadrant and epigastric area  Musculoskeletal: Normal range of motion.  Neurological: She is alert and oriented to person, place, and time.  Psychiatric: She has a normal mood and affect.  Nursing note and vitals reviewed.   ED Course  Procedures (including critical care time) Labs Review Labs Reviewed  COMPREHENSIVE METABOLIC PANEL - Abnormal; Notable for the following:    Potassium 3.3 (*)    ALT 52 (*)    All other components within normal limits  URINALYSIS, ROUTINE W REFLEX MICROSCOPIC - Abnormal; Notable for the following:    Leukocytes, UA SMALL (*)    All other components within normal limits  CBC WITH DIFFERENTIAL/PLATELET  LIPASE, BLOOD  AMYLASE  URINE MICROSCOPIC-ADD ON  POC  URINE PREG, ED    Imaging Review Dg Chest 2 View  01/25/2015   CLINICAL DATA:  Upper abdominal pain.  Mid to upper back pain.  EXAM: CHEST  2 VIEW  COMPARISON:  03/14/2013  FINDINGS: The heart size and mediastinal contours are within normal limits. Both lungs are clear. The visualized skeletal structures are unremarkable.  IMPRESSION: No active cardiopulmonary disease.   Electronically Signed   By: Signa Kell M.D.   On: 01/25/2015 08:27     EKG Interpretation None      MDM Pt reports she feels much better.   Pt  Given rx for tramadol and zofran odt.  Pt advised to follow up with GI doctor for evaluation,   Final diagnoses:  Abdominal discomfort  Lonia Skinner [place], PA-C 01/25/15 1610  Layla Maw Ward, DO 01/25/15 [phone]

## 2015-01-25 NOTE — Discharge Instructions (Signed)

## 2015-01-25 NOTE — ED Notes (Signed)
Pt also concerned that she may have pneumonia. She reports having it for 9 months. She reports a cough. but denies fever.

## 2015-01-25 NOTE — ED Notes (Signed)
Pt reports having upper abdominal pain and a lower back pain. Pt has hx of pancreatitis.

## 2015-04-24 ENCOUNTER — Encounter (HOSPITAL_COMMUNITY): Payer: Self-pay | Admitting: Pulmonary Disease

## 2016-03-24 ENCOUNTER — Other Ambulatory Visit: Payer: Self-pay | Admitting: Gastroenterology

## 2016-03-24 DIAGNOSIS — R1013 Epigastric pain: Secondary | ICD-10-CM

## 2016-04-05 ENCOUNTER — Ambulatory Visit
Admission: RE | Admit: 2016-04-05 | Discharge: 2016-04-05 | Disposition: A | Discharge status: Home / Self Care | Payer: BLUE CROSS/BLUE SHIELD | Source: Ambulatory Visit | Attending: Gastroenterology | Admitting: Gastroenterology

## 2016-04-05 DIAGNOSIS — R1013 Epigastric pain: Secondary | ICD-10-CM

## 2016-09-20 DIAGNOSIS — Z8719 Personal history of other diseases of the digestive system: Secondary | ICD-10-CM | POA: Diagnosis not present

## 2016-09-20 DIAGNOSIS — R1010 Upper abdominal pain, unspecified: Secondary | ICD-10-CM | POA: Diagnosis not present

## 2016-09-20 DIAGNOSIS — K921 Melena: Secondary | ICD-10-CM | POA: Diagnosis not present

## 2016-10-05 DIAGNOSIS — Z9049 Acquired absence of other specified parts of digestive tract: Secondary | ICD-10-CM | POA: Diagnosis not present

## 2016-10-05 DIAGNOSIS — K921 Melena: Secondary | ICD-10-CM | POA: Diagnosis not present

## 2016-10-05 DIAGNOSIS — Z888 Allergy status to other drugs, medicaments and biological substances status: Secondary | ICD-10-CM | POA: Diagnosis not present

## 2016-10-05 DIAGNOSIS — K644 Residual hemorrhoidal skin tags: Secondary | ICD-10-CM | POA: Diagnosis not present

## 2016-10-05 DIAGNOSIS — R1013 Epigastric pain: Secondary | ICD-10-CM | POA: Diagnosis not present

## 2016-10-05 DIAGNOSIS — Z885 Allergy status to narcotic agent status: Secondary | ICD-10-CM | POA: Diagnosis not present

## 2016-10-05 DIAGNOSIS — K6389 Other specified diseases of intestine: Secondary | ICD-10-CM | POA: Diagnosis not present

## 2016-10-05 DIAGNOSIS — R197 Diarrhea, unspecified: Secondary | ICD-10-CM | POA: Diagnosis not present

## 2016-10-18 DIAGNOSIS — J020 Streptococcal pharyngitis: Secondary | ICD-10-CM | POA: Diagnosis not present

## 2016-11-17 DIAGNOSIS — R197 Diarrhea, unspecified: Secondary | ICD-10-CM | POA: Diagnosis not present

## 2016-11-17 DIAGNOSIS — E860 Dehydration: Secondary | ICD-10-CM | POA: Diagnosis not present

## 2016-11-17 DIAGNOSIS — R112 Nausea with vomiting, unspecified: Secondary | ICD-10-CM | POA: Diagnosis not present

## 2016-12-20 DIAGNOSIS — R1010 Upper abdominal pain, unspecified: Secondary | ICD-10-CM | POA: Diagnosis not present

## 2016-12-20 DIAGNOSIS — K921 Melena: Secondary | ICD-10-CM | POA: Diagnosis not present

## 2016-12-20 DIAGNOSIS — K580 Irritable bowel syndrome with diarrhea: Secondary | ICD-10-CM | POA: Diagnosis not present

## 2017-01-31 DIAGNOSIS — H538 Other visual disturbances: Secondary | ICD-10-CM | POA: Diagnosis not present

## 2017-01-31 DIAGNOSIS — H52223 Regular astigmatism, bilateral: Secondary | ICD-10-CM | POA: Diagnosis not present

## 2017-04-15 DIAGNOSIS — J0100 Acute maxillary sinusitis, unspecified: Secondary | ICD-10-CM | POA: Diagnosis not present

## 2017-12-19 DIAGNOSIS — Z130 Encounter for screening for diseases of the blood and blood-forming organs and certain disorders involving the immune mechanism: Secondary | ICD-10-CM | POA: Diagnosis not present

## 2017-12-19 DIAGNOSIS — Z6828 Body mass index (BMI) 28.0-28.9, adult: Secondary | ICD-10-CM | POA: Diagnosis not present

## 2017-12-19 DIAGNOSIS — Z1389 Encounter for screening for other disorder: Secondary | ICD-10-CM | POA: Diagnosis not present

## 2017-12-19 DIAGNOSIS — Z01419 Encounter for gynecological examination (general) (routine) without abnormal findings: Secondary | ICD-10-CM | POA: Diagnosis not present

## 2017-12-19 DIAGNOSIS — Z124 Encounter for screening for malignant neoplasm of cervix: Secondary | ICD-10-CM | POA: Diagnosis not present

## 2017-12-20 DIAGNOSIS — Z124 Encounter for screening for malignant neoplasm of cervix: Secondary | ICD-10-CM | POA: Diagnosis not present

## 2018-01-23 DIAGNOSIS — R1110 Vomiting, unspecified: Secondary | ICD-10-CM | POA: Diagnosis not present

## 2018-01-23 DIAGNOSIS — R350 Frequency of micturition: Secondary | ICD-10-CM | POA: Diagnosis not present

## 2018-01-23 DIAGNOSIS — N83201 Unspecified ovarian cyst, right side: Secondary | ICD-10-CM | POA: Diagnosis not present

## 2018-01-23 DIAGNOSIS — Z885 Allergy status to narcotic agent status: Secondary | ICD-10-CM | POA: Diagnosis not present

## 2018-01-23 DIAGNOSIS — R1030 Lower abdominal pain, unspecified: Secondary | ICD-10-CM | POA: Diagnosis not present

## 2018-01-30 DIAGNOSIS — Z3202 Encounter for pregnancy test, result negative: Secondary | ICD-10-CM | POA: Diagnosis not present

## 2018-01-30 DIAGNOSIS — R1031 Right lower quadrant pain: Secondary | ICD-10-CM | POA: Diagnosis not present

## 2018-02-01 DIAGNOSIS — N83201 Unspecified ovarian cyst, right side: Secondary | ICD-10-CM | POA: Diagnosis not present

## 2018-02-01 DIAGNOSIS — O3680X0 Pregnancy with inconclusive fetal viability, not applicable or unspecified: Secondary | ICD-10-CM | POA: Diagnosis not present

## 2018-02-01 DIAGNOSIS — R748 Abnormal levels of other serum enzymes: Secondary | ICD-10-CM | POA: Diagnosis not present

## 2018-02-01 DIAGNOSIS — O3481 Maternal care for other abnormalities of pelvic organs, first trimester: Secondary | ICD-10-CM | POA: Diagnosis not present

## 2018-02-01 DIAGNOSIS — R109 Unspecified abdominal pain: Secondary | ICD-10-CM | POA: Diagnosis not present

## 2018-02-01 DIAGNOSIS — Z3A01 Less than 8 weeks gestation of pregnancy: Secondary | ICD-10-CM | POA: Diagnosis not present

## 2018-02-01 DIAGNOSIS — O26891 Other specified pregnancy related conditions, first trimester: Secondary | ICD-10-CM | POA: Diagnosis not present

## 2018-02-01 DIAGNOSIS — O210 Mild hyperemesis gravidarum: Secondary | ICD-10-CM | POA: Diagnosis not present

## 2018-02-03 DIAGNOSIS — Z3201 Encounter for pregnancy test, result positive: Secondary | ICD-10-CM | POA: Diagnosis not present

## 2018-02-09 DIAGNOSIS — N926 Irregular menstruation, unspecified: Secondary | ICD-10-CM | POA: Diagnosis not present

## 2018-02-12 DIAGNOSIS — O26891 Other specified pregnancy related conditions, first trimester: Secondary | ICD-10-CM | POA: Diagnosis not present

## 2018-02-12 DIAGNOSIS — R1031 Right lower quadrant pain: Secondary | ICD-10-CM | POA: Diagnosis not present

## 2018-02-12 DIAGNOSIS — R509 Fever, unspecified: Secondary | ICD-10-CM | POA: Diagnosis not present

## 2018-02-12 DIAGNOSIS — Z3A01 Less than 8 weeks gestation of pregnancy: Secondary | ICD-10-CM | POA: Diagnosis not present

## 2018-02-12 DIAGNOSIS — O3680X0 Pregnancy with inconclusive fetal viability, not applicable or unspecified: Secondary | ICD-10-CM | POA: Diagnosis not present

## 2018-02-12 DIAGNOSIS — Z885 Allergy status to narcotic agent status: Secondary | ICD-10-CM | POA: Diagnosis not present

## 2018-02-24 DIAGNOSIS — Z3A01 Less than 8 weeks gestation of pregnancy: Secondary | ICD-10-CM | POA: Diagnosis not present

## 2018-02-24 DIAGNOSIS — O26891 Other specified pregnancy related conditions, first trimester: Secondary | ICD-10-CM | POA: Diagnosis not present

## 2018-02-24 DIAGNOSIS — O34211 Maternal care for low transverse scar from previous cesarean delivery: Secondary | ICD-10-CM | POA: Diagnosis not present

## 2018-03-14 DIAGNOSIS — Z113 Encounter for screening for infections with a predominantly sexual mode of transmission: Secondary | ICD-10-CM | POA: Diagnosis not present

## 2018-03-14 DIAGNOSIS — Z3689 Encounter for other specified antenatal screening: Secondary | ICD-10-CM | POA: Diagnosis not present

## 2018-03-14 DIAGNOSIS — O26891 Other specified pregnancy related conditions, first trimester: Secondary | ICD-10-CM | POA: Diagnosis not present

## 2018-03-14 DIAGNOSIS — O34211 Maternal care for low transverse scar from previous cesarean delivery: Secondary | ICD-10-CM | POA: Diagnosis not present

## 2018-03-14 DIAGNOSIS — Z3682 Encounter for antenatal screening for nuchal translucency: Secondary | ICD-10-CM | POA: Diagnosis not present

## 2018-03-14 DIAGNOSIS — Z3A09 9 weeks gestation of pregnancy: Secondary | ICD-10-CM | POA: Diagnosis not present

## 2018-03-14 DIAGNOSIS — Z368A Encounter for antenatal screening for other genetic defects: Secondary | ICD-10-CM | POA: Diagnosis not present

## 2018-03-14 LAB — OB RESULTS CONSOLE RPR: RPR: NONREACTIVE

## 2018-03-14 LAB — OB RESULTS CONSOLE HIV ANTIBODY (ROUTINE TESTING): HIV: NONREACTIVE

## 2018-03-14 LAB — OB RESULTS CONSOLE ABO/RH: RH Type: POSITIVE

## 2018-03-14 LAB — OB RESULTS CONSOLE GC/CHLAMYDIA
Chlamydia: NEGATIVE
Gonorrhea: NEGATIVE

## 2018-03-14 LAB — OB RESULTS CONSOLE HEPATITIS B SURFACE ANTIGEN: HEP B S AG: NEGATIVE

## 2018-03-14 LAB — OB RESULTS CONSOLE ANTIBODY SCREEN: ANTIBODY SCREEN: NEGATIVE

## 2018-03-14 LAB — OB RESULTS CONSOLE RUBELLA ANTIBODY, IGM: RUBELLA: IMMUNE

## 2018-04-03 DIAGNOSIS — Z3A12 12 weeks gestation of pregnancy: Secondary | ICD-10-CM | POA: Diagnosis not present

## 2018-04-03 DIAGNOSIS — Z3682 Encounter for antenatal screening for nuchal translucency: Secondary | ICD-10-CM | POA: Diagnosis not present

## 2018-04-03 DIAGNOSIS — O34211 Maternal care for low transverse scar from previous cesarean delivery: Secondary | ICD-10-CM | POA: Diagnosis not present

## 2018-04-21 DIAGNOSIS — O9989 Other specified diseases and conditions complicating pregnancy, childbirth and the puerperium: Secondary | ICD-10-CM | POA: Diagnosis not present

## 2018-05-04 DIAGNOSIS — O2200 Varicose veins of lower extremity in pregnancy, unspecified trimester: Secondary | ICD-10-CM | POA: Diagnosis not present

## 2018-05-05 DIAGNOSIS — J019 Acute sinusitis, unspecified: Secondary | ICD-10-CM | POA: Diagnosis not present

## 2018-05-05 DIAGNOSIS — B370 Candidal stomatitis: Secondary | ICD-10-CM | POA: Diagnosis not present

## 2018-05-11 DIAGNOSIS — M5489 Other dorsalgia: Secondary | ICD-10-CM | POA: Diagnosis not present

## 2018-05-15 DIAGNOSIS — Z3A18 18 weeks gestation of pregnancy: Secondary | ICD-10-CM | POA: Diagnosis not present

## 2018-05-15 DIAGNOSIS — Z363 Encounter for antenatal screening for malformations: Secondary | ICD-10-CM | POA: Diagnosis not present

## 2018-06-02 DIAGNOSIS — Z362 Encounter for other antenatal screening follow-up: Secondary | ICD-10-CM | POA: Diagnosis not present

## 2018-06-02 DIAGNOSIS — Z3A21 21 weeks gestation of pregnancy: Secondary | ICD-10-CM | POA: Diagnosis not present

## 2018-07-24 DIAGNOSIS — Z3689 Encounter for other specified antenatal screening: Secondary | ICD-10-CM | POA: Diagnosis not present

## 2018-07-24 DIAGNOSIS — Z23 Encounter for immunization: Secondary | ICD-10-CM | POA: Diagnosis not present

## 2018-09-11 DIAGNOSIS — Z3A35 35 weeks gestation of pregnancy: Secondary | ICD-10-CM | POA: Diagnosis not present

## 2018-09-11 DIAGNOSIS — O3663X0 Maternal care for excessive fetal growth, third trimester, not applicable or unspecified: Secondary | ICD-10-CM | POA: Diagnosis not present

## 2018-09-25 DIAGNOSIS — Z3A37 37 weeks gestation of pregnancy: Secondary | ICD-10-CM | POA: Diagnosis not present

## 2018-09-25 DIAGNOSIS — Z3685 Encounter for antenatal screening for Streptococcus B: Secondary | ICD-10-CM | POA: Diagnosis not present

## 2018-09-26 ENCOUNTER — Encounter (HOSPITAL_COMMUNITY): Payer: Self-pay | Admitting: *Deleted

## 2018-09-29 DIAGNOSIS — Z98891 History of uterine scar from previous surgery: Secondary | ICD-10-CM

## 2018-09-29 NOTE — H&P (Signed)
Valerie Cortez is a 27 y.o.G28P2002 female presenting at 39+weeks for scheduled repeat cesarean section with bilateral tubal ligation. Pt had first c/s for prolapsed cord. GBS is negative. Pregnancy care uncomplicated. Dated per LMp and confirmed with 7wk US OB History    Gravida  3   Para  2   Term  2   Preterm      AB      Living  2     SAB      TAB      Ectopic      Multiple      Live Births  2          Past Medical History:  Diagnosis Date  . Anemia   . Asthma    as a child - does not use inhaler  . Constipation    treated with diet change  . Difficult intubation   . GERD (gastroesophageal reflux disease)    diet controlled - no meds  . Headache(784.0)    Hx last migraine 2 yrs ago  . Hx of chlamydia infection    resolved  . Hx of eczema   . Hypotension    Hx prior to first preg 09/2012, no longer an issue per patient  . Pancreatitis    History - resolved 12/2012  . Pneumonia    HX    Past Surgical History:  Procedure Laterality Date  . CESAREAN SECTION  10/04/2012   Procedure: CESAREAN SECTION;  Surgeon: Bing Plume, MD;  Location: [facility];  Service: Obstetrics;  Laterality: N/A;  . CESAREAN SECTION N/A 08/28/2014   Procedure: REPEAT CESAREAN SECTION;  Surgeon: Bing Plume, MD;  Location: [facility];  Service: Obstetrics;  Laterality: N/A;  . CHOLECYSTECTOMY  12/02/2012   Procedure: LAPAROSCOPIC CHOLECYSTECTOMY WITH INTRAOPERATIVE CHOLANGIOGRAM;  Surgeon: Romie Levee, MD;  Location: [facility];  Service: General;  Laterality: N/A;  . ERCP  12/04/2012   Procedure: ENDOSCOPIC RETROGRADE CHOLANGIOPANCREATOGRAPHY (ERCP);  Surgeon: Barrie Folk, MD;  [facility];  Service: Endoscopy;  Laterality: N/A;  . ERCP  12/05/2012   Procedure: ENDOSCOPIC RETROGRADE CHOLANGIOPANCREATOGRAPHY (ERCP);  Surgeon: Barrie Folk, MD;  [facility];  Service: Endoscopy;  Laterality: N/A;  . EUS N/A 01/24/2013   Procedure: ESOPHAGEAL ENDOSCOPIC ULTRASOUND (EUS)  RADIAL;  Surgeon: Willis Modena, MD;  Location: [facility];  Service: Endoscopy;  Laterality: N/A;  . WISDOM TOOTH EXTRACTION     Family History: family history includes Asthma in her brother; COPD in her maternal grandfather; Cancer in her maternal grandmother and paternal grandfather; Cerebral palsy in her mother; Diabetes in her maternal grandfather; Diverticulitis in her mother; Fibroids in her mother; Heart disease in her maternal grandfather; Hypertension in her maternal grandfather; Migraines in her mother. Social History:  reports that she has never smoked. She has never used smokeless tobacco. She reports that she does not drink alcohol or use drugs.     Maternal Diabetes: No Genetic Screening: Normal Maternal Ultrasounds/Referrals: Normal Fetal Ultrasounds or other Referrals:  None Maternal Substance Abuse:  No Significant Maternal Medications:  None Significant Maternal Lab Results:  Lab values include: Group B Strep negative Other Comments:  None  Review of Systems  Constitutional: Negative for chills, fever and malaise/fatigue.  Eyes: Negative for blurred vision.  Respiratory: Negative for shortness of breath.   Cardiovascular: Negative for chest pain.  Gastrointestinal: Negative for abdominal pain, heartburn and nausea.  Genitourinary: Negative for dysuria.  Musculoskeletal: Positive for myalgias.  Skin:  Negative for itching and rash.  Neurological: Negative for dizziness and headaches.  Endo/Heme/Allergies: Does not bruise/bleed easily.  Psychiatric/Behavioral: Negative for depression, hallucinations, substance abuse and suicidal ideas. The patient is nervous/anxious.    Maternal Medical History:  Reason for admission: Nausea. Prior cesarean section; Desires repeat with BTL  Contractions: Frequency: rare.    Fetal activity: Perceived fetal activity is normal.   Last perceived fetal movement was within the past hour.    Prenatal complications: no prenatal  complications Prenatal Complications - Diabetes: none.      Last menstrual period 01/04/2018, unknown if currently breastfeeding. Maternal Exam:  Uterine Assessment: Contraction frequency is rare.   Abdomen: Patient reports generalized tenderness.  Estimated fetal weight is AGA.   Fetal presentation: vertex  Introitus: Normal vulva. Normal vagina.    Physical Exam  Constitutional: She is oriented to person, place, and time. She appears well-developed and well-nourished.  Neck: Normal range of motion.  Cardiovascular: Intact distal pulses.  Respiratory: Effort normal.  GI: Soft. There is generalized tenderness.  Genitourinary: Vagina normal and uterus normal.  Musculoskeletal: Normal range of motion.  Neurological: She is alert and oriented to person, place, and time.  Skin: Skin is warm.  Psychiatric: She has a normal mood and affect. Her behavior is normal. Judgment and thought content normal.    Prenatal labs: ABO, Rh: AB/Positive/-- (04/30 0000) Antibody: n (04/30 0000) Rubella: Immune (04/30 0000) RPR: Nonreactive (04/30 0000)  HBsAg: Negative (04/30 0000)  HIV: Non-reactive (04/30 0000)  GBS:     Assessment/Plan: 53 27 G3P2002 female here for scheduled repeat cesarean section with bilateral tubal ligation Discussed procedure with risks and benefits Consent confirmed To OR when ready   Phyllis Whitefield W Moosa Bueche 09/29/2018, 4:52 AM

## 2018-10-05 ENCOUNTER — Encounter (HOSPITAL_COMMUNITY)
Admission: RE | Admit: 2018-10-05 | Discharge: 2018-10-05 | Disposition: A | Discharge status: Home / Self Care | Payer: BLUE CROSS/BLUE SHIELD | Source: Ambulatory Visit | Attending: Obstetrics and Gynecology | Admitting: Obstetrics and Gynecology

## 2018-10-05 DIAGNOSIS — Z302 Encounter for sterilization: Secondary | ICD-10-CM | POA: Diagnosis not present

## 2018-10-05 DIAGNOSIS — O34219 Maternal care for unspecified type scar from previous cesarean delivery: Secondary | ICD-10-CM | POA: Diagnosis not present

## 2018-10-05 DIAGNOSIS — Z3A39 39 weeks gestation of pregnancy: Secondary | ICD-10-CM | POA: Diagnosis not present

## 2018-10-05 DIAGNOSIS — O34211 Maternal care for low transverse scar from previous cesarean delivery: Secondary | ICD-10-CM | POA: Diagnosis not present

## 2018-10-05 HISTORY — DX: Personal history of diseases of the skin and subcutaneous tissue: Z87.2

## 2018-10-05 LAB — CBC
HEMATOCRIT: 39.5 % (ref 36.0–46.0)
Hemoglobin: 13.1 g/dL (ref 12.0–15.0)
MCH: 29.4 pg (ref 26.0–34.0)
MCHC: 33.2 g/dL (ref 30.0–36.0)
MCV: 88.8 fL (ref 80.0–100.0)
NRBC: 0 % (ref 0.0–0.2)
Platelets: 137 10*3/uL — ABNORMAL LOW (ref 150–400)
RBC: 4.45 MIL/uL (ref 3.87–5.11)
RDW: 14 % (ref 11.5–15.5)
WBC: 7.4 10*3/uL (ref 4.0–10.5)

## 2018-10-05 LAB — TYPE AND SCREEN
ABO/RH(D): AB POS
Antibody Screen: NEGATIVE

## 2018-10-05 NOTE — Patient Instructions (Signed)
Patience Muscaara N Jardin  10/05/2018   Your procedure is scheduled on:  12/06/2017  [facility] through the [facility]tr[facility] at 0815 AM.  Pick up the phone at the desk and dial [phone]  Call this number if you have problems the morning of surgery:[phone]  Remember:   Do not eat food:(After Midnight) Desps de medianoche.  Do not drink clear liquids: (After Midnight) Desps de medianoche.  Take these medicines the morning of surgery with A SIP OF WATER: none   Do not wear jewelry, make-up or nail polish.  Do not wear lotions, powders, or perfumes. Do not wear deodorant.  Do not shave 48 hours prior to surgery.  Do not bring valuables to the hospital.  [facility] is not   responsible for any belongings or valuables brought to the hospital.  Contacts, dentures or bridgework may not be worn into surgery.  Leave suitcase in the car. After surgery it may be brought to your room.  For patients admitted to the hospital, checkout time is 11:00 AM the day of              discharge.    N/A   Please read over the following fact sheets that you were given:   Surgical Site Infection Prevention

## 2018-10-05 NOTE — Pre-Procedure Instructions (Signed)
Dr Armond HangWoodrum notified of platelets.  No orders received.

## 2018-10-05 NOTE — Anesthesia Preprocedure Evaluation (Addendum)
Anesthesia Evaluation  Patient identified by MRN, date of birth, ID band Patient awake  General Assessment Comment:Hx of difficult airway w endo. No problems w last anesthetic.  Reviewed: Allergy & Precautions, NPO status , Patient's Chart, lab work & pertinent test results  History of Anesthesia Complications (+) DIFFICULT AIRWAY and history of anesthetic complications  Airway Mallampati: I  TM Distance: >3 FB Neck ROM: Full    Dental no notable dental hx. (+) Teeth Intact, Dental Advisory Given   Pulmonary    Pulmonary exam normal breath sounds clear to auscultation       Cardiovascular negative cardio ROS Normal cardiovascular exam Rhythm:Regular Rate:Normal     Neuro/Psych  Headaches,    GI/Hepatic Neg liver ROS, GERD  ,  Endo/Other  negative endocrine ROS  Renal/GU negative Renal ROS     Musculoskeletal   Abdominal   Peds  Hematology  (+) anemia ,   Anesthesia Other Findings   Reproductive/Obstetrics (+) Pregnancy                           Lab Results  Component Value Date   WBC 7.4 10/05/2018   HGB 13.1 10/05/2018   HCT 39.5 10/05/2018   MCV 88.8 10/05/2018   PLT 137 (L) 10/05/2018    Anesthesia Physical Anesthesia Plan  ASA: II  Anesthesia Plan: General   Post-op Pain Management:    Induction: Intravenous  PONV Risk Score and Plan: Treatment may vary due to age or medical condition  Airway Management Planned:   Additional Equipment:   Intra-op Plan:   Post-operative Plan: Extubation in OR  Informed Consent: I have reviewed the patients History and Physical, chart, labs and discussed the procedure including the risks, benefits and alternatives for the proposed anesthesia with the patient or authorized representative who has indicated his/her understanding and acceptance.   Dental advisory given  Plan Discussed with: CRNA  Anesthesia Plan Comments:          Anesthesia Quick Evaluation

## 2018-10-06 ENCOUNTER — Inpatient Hospital Stay (HOSPITAL_COMMUNITY): Payer: BLUE CROSS/BLUE SHIELD | Admitting: Anesthesiology

## 2018-10-06 ENCOUNTER — Encounter (HOSPITAL_COMMUNITY)
Admission: RE | Disposition: A | Discharge status: Home / Self Care | Payer: Self-pay | Source: Home / Self Care | Attending: Obstetrics and Gynecology

## 2018-10-06 ENCOUNTER — Encounter (HOSPITAL_COMMUNITY): Payer: Self-pay | Admitting: *Deleted

## 2018-10-06 ENCOUNTER — Inpatient Hospital Stay (HOSPITAL_COMMUNITY)
Admission: RE | Admit: 2018-10-06 | Discharge: 2018-10-08 | DRG: 785 | Disposition: A | Discharge status: Home / Self Care | Payer: BLUE CROSS/BLUE SHIELD | Attending: Obstetrics and Gynecology | Admitting: Obstetrics and Gynecology

## 2018-10-06 DIAGNOSIS — Z3A39 39 weeks gestation of pregnancy: Secondary | ICD-10-CM

## 2018-10-06 DIAGNOSIS — Z3490 Encounter for supervision of normal pregnancy, unspecified, unspecified trimester: Secondary | ICD-10-CM

## 2018-10-06 DIAGNOSIS — Z302 Encounter for sterilization: Secondary | ICD-10-CM | POA: Diagnosis not present

## 2018-10-06 DIAGNOSIS — O34211 Maternal care for low transverse scar from previous cesarean delivery: Principal | ICD-10-CM | POA: Diagnosis present

## 2018-10-06 DIAGNOSIS — Z98891 History of uterine scar from previous surgery: Secondary | ICD-10-CM

## 2018-10-06 DIAGNOSIS — O34219 Maternal care for unspecified type scar from previous cesarean delivery: Secondary | ICD-10-CM | POA: Diagnosis not present

## 2018-10-06 HISTORY — DX: Encounter for sterilization: Z30.2

## 2018-10-06 LAB — RPR: RPR Ser Ql: NONREACTIVE

## 2018-10-06 SURGERY — Surgical Case
Anesthesia: General | Wound class: Clean Contaminated

## 2018-10-06 MED ORDER — MORPHINE SULFATE (PF) 0.5 MG/ML IJ SOLN
INTRAMUSCULAR | Status: AC
  Filled 2018-10-06: qty 10

## 2018-10-06 MED ORDER — SIMETHICONE 80 MG PO CHEW
80.0000 mg | CHEWABLE_TABLET | Freq: Three times a day (TID) | ORAL | Status: DC
  Administered 2018-10-07 – 2018-10-08 (×5): 80 mg via ORAL
  Filled 2018-10-06 (×4): qty 1

## 2018-10-06 MED ORDER — ONDANSETRON HCL 4 MG/2ML IJ SOLN: 4.0000 mg | Freq: Three times a day (TID) | INTRAMUSCULAR | Status: DC | PRN

## 2018-10-06 MED ORDER — ONDANSETRON HCL 4 MG/2ML IJ SOLN
4.0000 mg | INTRAMUSCULAR | Status: DC | PRN
  Administered 2018-10-06: 4 mg via INTRAVENOUS
  Filled 2018-10-06: qty 2

## 2018-10-06 MED ORDER — NALOXONE HCL 4 MG/10ML IJ SOLN
1.0000 ug/kg/h | INTRAVENOUS | Status: DC | PRN
  Filled 2018-10-06: qty 5

## 2018-10-06 MED ORDER — IBUPROFEN 600 MG PO TABS
600.0000 mg | ORAL_TABLET | Freq: Four times a day (QID) | ORAL | Status: DC
  Administered 2018-10-06 – 2018-10-08 (×6): 600 mg via ORAL
  Filled 2018-10-06 (×6): qty 1

## 2018-10-06 MED ORDER — PHENYLEPHRINE 8 MG IN D5W 100 ML (0.08MG/ML) PREMIX OPTIME
INJECTION | INTRAVENOUS | Status: DC | PRN
  Administered 2018-10-06: 60 ug/min via INTRAVENOUS

## 2018-10-06 MED ORDER — PROMETHAZINE HCL 25 MG/ML IJ SOLN: 6.2500 mg | INTRAMUSCULAR | Status: DC | PRN

## 2018-10-06 MED ORDER — SODIUM CHLORIDE 0.9 % IR SOLN
Status: DC | PRN
  Administered 2018-10-06: 1000 mL

## 2018-10-06 MED ORDER — KETOROLAC TROMETHAMINE 30 MG/ML IJ SOLN: 30.0000 mg | Freq: Four times a day (QID) | INTRAMUSCULAR | Status: AC | PRN

## 2018-10-06 MED ORDER — KETOROLAC TROMETHAMINE 30 MG/ML IJ SOLN
INTRAMUSCULAR | Status: AC
  Filled 2018-10-06: qty 1

## 2018-10-06 MED ORDER — OXYCODONE HCL 5 MG PO TABS: 10.0000 mg | ORAL_TABLET | ORAL | Status: DC | PRN

## 2018-10-06 MED ORDER — ONDANSETRON HCL 4 MG/2ML IJ SOLN
INTRAMUSCULAR | Status: AC
  Filled 2018-10-06: qty 2

## 2018-10-06 MED ORDER — SCOPOLAMINE 1 MG/3DAYS TD PT72
MEDICATED_PATCH | TRANSDERMAL | Status: AC
  Filled 2018-10-06: qty 1

## 2018-10-06 MED ORDER — SIMETHICONE 80 MG PO CHEW
80.0000 mg | CHEWABLE_TABLET | ORAL | Status: DC
  Administered 2018-10-06: 80 mg via ORAL
  Filled 2018-10-06 (×2): qty 1

## 2018-10-06 MED ORDER — DEXAMETHASONE SODIUM PHOSPHATE 4 MG/ML IJ SOLN
INTRAMUSCULAR | Status: DC | PRN
  Administered 2018-10-06: 4 mg via INTRAVENOUS

## 2018-10-06 MED ORDER — NALOXONE HCL 0.4 MG/ML IJ SOLN: 0.4000 mg | INTRAMUSCULAR | Status: DC | PRN

## 2018-10-06 MED ORDER — MEPERIDINE HCL 25 MG/ML IJ SOLN: 6.2500 mg | INTRAMUSCULAR | Status: DC | PRN

## 2018-10-06 MED ORDER — DIPHENHYDRAMINE HCL 25 MG PO CAPS: 25.0000 mg | ORAL_CAPSULE | Freq: Four times a day (QID) | ORAL | Status: DC | PRN

## 2018-10-06 MED ORDER — TETANUS-DIPHTH-ACELL PERTUSSIS 5-2.5-18.5 LF-MCG/0.5 IM SUSP: 0.5000 mL | Freq: Once | INTRAMUSCULAR | Status: DC

## 2018-10-06 MED ORDER — LACTATED RINGERS IV SOLN
INTRAVENOUS | Status: DC
  Administered 2018-10-06 (×3): via INTRAVENOUS

## 2018-10-06 MED ORDER — SCOPOLAMINE 1 MG/3DAYS TD PT72
MEDICATED_PATCH | TRANSDERMAL | Status: DC | PRN
  Administered 2018-10-06: 1 via TRANSDERMAL

## 2018-10-06 MED ORDER — ACETAMINOPHEN 325 MG PO TABS
650.0000 mg | ORAL_TABLET | ORAL | Status: DC | PRN
  Administered 2018-10-06 – 2018-10-07 (×4): 650 mg via ORAL
  Filled 2018-10-06 (×4): qty 2

## 2018-10-06 MED ORDER — BUPIVACAINE IN DEXTROSE 0.75-8.25 % IT SOLN
INTRATHECAL | Status: DC | PRN
  Administered 2018-10-06: 1.7 mL via INTRATHECAL

## 2018-10-06 MED ORDER — OXYTOCIN 40 UNITS IN LACTATED RINGERS INFUSION - SIMPLE MED: 2.5000 [IU]/h | INTRAVENOUS | Status: AC

## 2018-10-06 MED ORDER — COCONUT OIL OIL: 1.0000 "application " | TOPICAL_OIL | Status: DC | PRN

## 2018-10-06 MED ORDER — HYDROMORPHONE HCL 1 MG/ML IJ SOLN: 0.2500 mg | INTRAMUSCULAR | Status: DC | PRN

## 2018-10-06 MED ORDER — SIMETHICONE 80 MG PO CHEW: 80.0000 mg | CHEWABLE_TABLET | ORAL | Status: DC | PRN

## 2018-10-06 MED ORDER — OXYTOCIN 10 UNIT/ML IJ SOLN
INTRAMUSCULAR | Status: AC
  Filled 2018-10-06: qty 4

## 2018-10-06 MED ORDER — FENTANYL CITRATE (PF) 100 MCG/2ML IJ SOLN
INTRAMUSCULAR | Status: AC
  Filled 2018-10-06: qty 2

## 2018-10-06 MED ORDER — FENTANYL CITRATE (PF) 100 MCG/2ML IJ SOLN
INTRAMUSCULAR | Status: DC | PRN
  Administered 2018-10-06: 15 ug via INTRATHECAL

## 2018-10-06 MED ORDER — MORPHINE SULFATE (PF) 0.5 MG/ML IJ SOLN
INTRAMUSCULAR | Status: DC | PRN
  Administered 2018-10-06: .15 mg via INTRATHECAL

## 2018-10-06 MED ORDER — DIPHENHYDRAMINE HCL 25 MG PO CAPS
25.0000 mg | ORAL_CAPSULE | ORAL | Status: DC | PRN
  Filled 2018-10-06: qty 1

## 2018-10-06 MED ORDER — DIPHENHYDRAMINE HCL 50 MG/ML IJ SOLN: 12.5000 mg | INTRAMUSCULAR | Status: DC | PRN

## 2018-10-06 MED ORDER — NALBUPHINE HCL 10 MG/ML IJ SOLN: 5.0000 mg | INTRAMUSCULAR | Status: DC | PRN

## 2018-10-06 MED ORDER — PHENYLEPHRINE 8 MG IN D5W 100 ML (0.08MG/ML) PREMIX OPTIME
INJECTION | INTRAVENOUS | Status: AC
  Filled 2018-10-06: qty 100

## 2018-10-06 MED ORDER — KETOROLAC TROMETHAMINE 30 MG/ML IJ SOLN
30.0000 mg | Freq: Four times a day (QID) | INTRAMUSCULAR | Status: AC | PRN
  Administered 2018-10-06: 30 mg via INTRAMUSCULAR

## 2018-10-06 MED ORDER — BROMPHENIRAMINE-PSEUDOEPH 1-15 MG/5ML PO ELIX: 20.0000 mL | ORAL_SOLUTION | ORAL | Status: DC | PRN

## 2018-10-06 MED ORDER — OXYTOCIN 10 UNIT/ML IJ SOLN
INTRAVENOUS | Status: DC | PRN
  Administered 2018-10-06: 40 [IU] via INTRAVENOUS

## 2018-10-06 MED ORDER — PRENATAL MULTIVITAMIN CH
1.0000 | ORAL_TABLET | Freq: Every day | ORAL | Status: DC
  Administered 2018-10-07: 1 via ORAL
  Filled 2018-10-06: qty 1

## 2018-10-06 MED ORDER — WITCH HAZEL-GLYCERIN EX PADS: 1.0000 "application " | MEDICATED_PAD | CUTANEOUS | Status: DC | PRN

## 2018-10-06 MED ORDER — NALBUPHINE HCL 10 MG/ML IJ SOLN: 5.0000 mg | Freq: Once | INTRAMUSCULAR | Status: DC | PRN

## 2018-10-06 MED ORDER — HYDROCODONE-ACETAMINOPHEN 7.5-325 MG PO TABS: 1.0000 | ORAL_TABLET | Freq: Once | ORAL | Status: DC | PRN

## 2018-10-06 MED ORDER — MENTHOL 3 MG MT LOZG: 1.0000 | LOZENGE | OROMUCOSAL | Status: DC | PRN

## 2018-10-06 MED ORDER — DIBUCAINE 1 % RE OINT: 1.0000 "application " | TOPICAL_OINTMENT | RECTAL | Status: DC | PRN

## 2018-10-06 MED ORDER — SENNOSIDES-DOCUSATE SODIUM 8.6-50 MG PO TABS: 2.0000 | ORAL_TABLET | ORAL | Status: DC

## 2018-10-06 MED ORDER — SODIUM CHLORIDE 0.9% FLUSH: 3.0000 mL | INTRAVENOUS | Status: DC | PRN

## 2018-10-06 MED ORDER — ONDANSETRON HCL 4 MG/2ML IJ SOLN
INTRAMUSCULAR | Status: DC | PRN
  Administered 2018-10-06: 4 mg via INTRAVENOUS

## 2018-10-06 MED ORDER — LACTATED RINGERS IV SOLN
INTRAVENOUS | Status: DC
  Administered 2018-10-06: 17:00:00 via INTRAVENOUS

## 2018-10-06 MED ORDER — SODIUM CHLORIDE 0.9 % IV SOLN
2.0000 g | INTRAVENOUS | Status: AC
  Administered 2018-10-06: 2 g via INTRAVENOUS
  Filled 2018-10-06: qty 2

## 2018-10-06 MED ORDER — SCOPOLAMINE 1 MG/3DAYS TD PT72: 1.0000 | MEDICATED_PATCH | Freq: Once | TRANSDERMAL | Status: DC

## 2018-10-06 MED ORDER — OXYCODONE HCL 5 MG PO TABS: 5.0000 mg | ORAL_TABLET | ORAL | Status: DC | PRN

## 2018-10-06 MED ORDER — ACETAMINOPHEN 10 MG/ML IV SOLN: 1000.0000 mg | Freq: Once | INTRAVENOUS | Status: DC | PRN

## 2018-10-06 MED ORDER — MEPERIDINE HCL 25 MG/ML IJ SOLN
INTRAMUSCULAR | Status: DC | PRN
  Administered 2018-10-06: 12.5 mg via INTRAVENOUS

## 2018-10-06 MED ORDER — DEXAMETHASONE SODIUM PHOSPHATE 4 MG/ML IJ SOLN
INTRAMUSCULAR | Status: AC
  Filled 2018-10-06: qty 1

## 2018-10-06 MED ORDER — MEPERIDINE HCL 25 MG/ML IJ SOLN
INTRAMUSCULAR | Status: AC
  Filled 2018-10-06: qty 1

## 2018-10-06 SURGICAL SUPPLY — 37 items
BENZOIN TINCTURE PRP APPL 2/3 (GAUZE/BANDAGES/DRESSINGS) ×2 IMPLANT
BLADE SURG 10 STRL SS (BLADE) ×2 IMPLANT
CHLORAPREP W/TINT 26ML (MISCELLANEOUS) ×2 IMPLANT
CLAMP CORD UMBIL (MISCELLANEOUS) IMPLANT
CLOSURE STERI STRIP 1/2 X4 (GAUZE/BANDAGES/DRESSINGS) ×2 IMPLANT
CLOTH BEACON ORANGE TIMEOUT ST (SAFETY) ×2 IMPLANT
DRAPE C SECTION CLR SCREEN (DRAPES) ×2 IMPLANT
DRSG OPSITE POSTOP 4X10 (GAUZE/BANDAGES/DRESSINGS) ×2 IMPLANT
ELECT REM PT RETURN 9FT ADLT (ELECTROSURGICAL) ×2
ELECTRODE REM PT RTRN 9FT ADLT (ELECTROSURGICAL) ×1 IMPLANT
EXTRACTOR VACUUM KIWI (MISCELLANEOUS) IMPLANT
GLOVE BIO SURGEON STRL SZ 6.5 (GLOVE) ×2 IMPLANT
GLOVE BIOGEL PI IND STRL 7.0 (GLOVE) ×2 IMPLANT
GLOVE BIOGEL PI INDICATOR 7.0 (GLOVE) ×2
GOWN STRL REUS W/TWL LRG LVL3 (GOWN DISPOSABLE) ×4 IMPLANT
KIT ABG SYR 3ML LUER SLIP (SYRINGE) IMPLANT
NEEDLE HYPO 25X5/8 SAFETYGLIDE (NEEDLE) IMPLANT
NS IRRIG 1000ML POUR BTL (IV SOLUTION) ×2 IMPLANT
PACK C SECTION WH (CUSTOM PROCEDURE TRAY) ×2 IMPLANT
PAD OB MATERNITY 4.3X12.25 (PERSONAL CARE ITEMS) ×2 IMPLANT
RETRACTOR WND ALEXIS 25 LRG (MISCELLANEOUS) ×1 IMPLANT
RTRCTR C-SECT PINK 25CM LRG (MISCELLANEOUS) IMPLANT
RTRCTR WOUND ALEXIS 25CM LRG (MISCELLANEOUS) ×2
SPONGE LAP 18X18 RF (DISPOSABLE) ×8 IMPLANT
STRIP CLOSURE SKIN 1/2X4 (GAUZE/BANDAGES/DRESSINGS) IMPLANT
SUT CHROMIC 1 CTX 36 (SUTURE) ×4 IMPLANT
SUT PLAIN 0 NONE (SUTURE) IMPLANT
SUT PLAIN 2 0 XLH (SUTURE) ×2 IMPLANT
SUT VIC AB 0 CT1 27 (SUTURE) ×2
SUT VIC AB 0 CT1 27XBRD ANBCTR (SUTURE) ×2 IMPLANT
SUT VIC AB 2-0 CT1 27 (SUTURE) ×1
SUT VIC AB 2-0 CT1 TAPERPNT 27 (SUTURE) ×1 IMPLANT
SUT VIC AB 3-0 CT1 27 (SUTURE)
SUT VIC AB 3-0 CT1 TAPERPNT 27 (SUTURE) IMPLANT
SUT VIC AB 4-0 KS 27 (SUTURE) ×2 IMPLANT
TOWEL OR 17X24 6PK STRL BLUE (TOWEL DISPOSABLE) ×2 IMPLANT
TRAY FOLEY W/BAG SLVR 14FR LF (SET/KITS/TRAYS/PACK) ×2 IMPLANT

## 2018-10-06 NOTE — Anesthesia Postprocedure Evaluation (Signed)
Anesthesia Post Note  Patient: Valerie Cortez  Procedure(s) Performed: REPEAT CESAREAN SECTION WITH BILATERAL TUBAL LIGATION (N/A )     Patient location during evaluation: PACU Anesthesia Type: Spinal Level of consciousness: oriented and awake and alert Pain management: pain level controlled Vital Signs Assessment: post-procedure vital signs reviewed and stable Respiratory status: spontaneous breathing, respiratory function stable and patient connected to nasal cannula oxygen Cardiovascular status: blood pressure returned to baseline and stable Postop Assessment: no headache, no backache and no apparent nausea or vomiting Anesthetic complications: no    Last Vitals:  Vitals:   10/06/18 1230 10/06/18 1300  BP: 103/66 105/78  Pulse: 72 71  Resp: (!) 22 18  Temp:  37.2 C  SpO2: 99%     Last Pain:  Vitals:   10/06/18 1300  TempSrc: Oral  PainSc:    Pain Goal:                 Trevor IhaStephen A Houser

## 2018-10-06 NOTE — Addendum Note (Signed)
Addendum  created 10/06/18 1541 by Angela AdamWrinkle, Monette Omara G, CRNA   Sign clinical note

## 2018-10-06 NOTE — Transfer of Care (Signed)
Immediate Anesthesia Transfer of Care Note  Patient: Valerie Cortez  Procedure(s) Performed: REPEAT CESAREAN SECTION WITH BILATERAL TUBAL LIGATION (N/A )  Patient Location: PACU  Anesthesia Type:Spinal  Level of Consciousness: awake and alert   Airway & Oxygen Therapy: Patient Spontanous Breathing  Post-op Assessment: Report given to RN and Post -op Vital signs reviewed and stable  Post vital signs: Reviewed  Last Vitals:  Vitals Value Taken Time  BP    Temp    Pulse    Resp    SpO2      Last Pain:  Vitals:   10/06/18 0840  TempSrc: Oral  PainSc: 0-No pain         Complications: No apparent anesthesia complications

## 2018-10-06 NOTE — Lactation Note (Signed)
This note was copied from a baby's chart. Lactation Consultation Note  Patient Name: Valerie Prentiss Bellsara Cork [id number]'s Date: 10/06/2018 Reason for consult: 1st time breastfeeding;Term LC entered room mom had a lot of visitors preferred  if LC would come back later.   Maternal Data    Feeding Feeding Type: Breast Fed  LATCH Score                   Interventions    Lactation Tools Discussed/Used     Consult Status      Valerie Cortez 10/06/2018, 8:02 PM

## 2018-10-06 NOTE — Anesthesia Postprocedure Evaluation (Signed)
Anesthesia Post Note  Patient: Valerie Cortez  Procedure(s) Performed: REPEAT CESAREAN SECTION WITH BILATERAL TUBAL LIGATION (N/A )     Patient location during evaluation: Mother Baby Anesthesia Type: Spinal Level of consciousness: awake and alert, oriented and patient cooperative Pain management: pain level controlled Vital Signs Assessment: post-procedure vital signs reviewed and stable Respiratory status: spontaneous breathing Cardiovascular status: stable Postop Assessment: no headache, patient able to bend at knees, no signs of nausea or vomiting and spinal receding Anesthetic complications: no Comments: Pain score 8.  RN at bedside with pain med.    Last Vitals:  Vitals:   10/06/18 1300 10/06/18 1415  BP: 105/78 111/72  Pulse: 71 61  Resp: 18 18  Temp: 37.2 C (!) 36.4 C  SpO2:  98%    Last Pain:  Vitals:   10/06/18 1415  TempSrc: Oral  PainSc: 3    Pain Goal:                 [facility]phWRINKLE,Kadeisha Betsch

## 2018-10-06 NOTE — Op Note (Addendum)
Operative Note    Preoperative Diagnosis 1. IUP at 39 2/7 wks 2. Prior cesarean desires repeat 3. Requests permanent sterilization   Postoperative Diagnosis Same  Procedure: Repeat cesarean section with bilateral tubal ligation   Surgeon: Britt BottomBanga C DO Assist: Janalyn ShyHeather Kreitemeyer  Anesthesia: Spinal  Fluids: LR 1800ml EBL: 284ml UOP: 300ml clear  Findings: Viable female infant in cephalic position. Apgars 9,9. Weight pending. Grossly normal uterus, tubes nad ovaries. Mild scarring of fascia to omentun   Specimen: Placenta donated   Procedure Note Consent verified pre-op. Patient was taken to the operating room where spinal anesthesia was administered. She was prepped and draped in the normal sterile fashion in the dorsal supine position with a leftward tilt. An appropriate time out was performed. An allis clamp test was performed and anesthesia was found to be adequate.  Her old scar was excised per pt request and incision was carried through to the underlying layer of fascia by sharp dissection. The fascia was nicked in the midline and the incision was extended laterally with Mayo scissors.   The superior aspect of the incision was grasped Coker clamps and dissected off the underlying rectus muscles. In a similar fashion the inferior aspect was dissected off the rectus muscles. Mild scarring noted.. Rectus muscles were separated in the midline sharply and bluntly and the peritoneal cavity entered. The peritoneal incision was then extended both superiorly and inferiorly with careful attention to avoid both bowel and bladder. The Alexis self-retaining wound retractor was then placed within the incision and the lower uterine segment exposed. The bladder flap was developed with Metzenbaum scissors and pushed away from the lower uterine segment. The lower uterine segment was then incised in a transverse fashion and the cavity itself entered bluntly. Copious clear amniotic fluid was noted.   The infant's head was then lifted and delivered from the incision without difficulty; loose nuchal reduced over infants head. The remainder of the infant delivered easily with vigorous cry noted on delivery.  The infant was handed off to the waiting pediatricians after a minute delay for cord clamping. . The placenta was then manually expressed from the uterus and the uterus cleared of all clots and debris with moist lap sponge. The uterine incision was then repaired in a single layer with a  running locked layer 0 chromic suture.Next the left fallopian tube was identified till its fimbriated end and a 3cm portion of the mid isthmus area excised with 0 vicryl sutures at each end. Excellent hemostasis was appreciated. The right fallopian tube was identified and same process performed. The  ovaries were noted to be grossly normal and the gutters cleared of all clots and debris. The uterine incision was inspected and found to be hemostatic. All instruments and sponges as well as the Alexis retractor were then removed from the abdomen. The peritoneum was freed from adhesion to the fascia but was too retracted to reapproximate so next, the fascia was closed with 0 Vicryl in a running fashion. The skin was closed with a subcuticular stitch of 4-0 Vicryl on a Keith needle and then reinforced with benzoin and Steri-Strips. At the conclusion of the procedure all instruments and sponge counts were correct. Patient was taken to the recovery room in good condition with her baby accompanying her skin to skin.

## 2018-10-06 NOTE — Interval H&P Note (Signed)
History and Physical Interval Note: No change since H/p. Reiterates desire for repeat c/s and BTL with family present  10/06/2018 9:54 AM  Valerie Cortez  has presented today for surgery, with the diagnosis of repeat C-Section with bilateral tubal ligation  The various methods of treatment have been discussed with the patient and family. After consideration of risks, benefits and other options for treatment, the patient has consented to  Procedure(s) with comments: REPEAT CESAREAN SECTION WITH BILATERAL TUBAL LIGATION (N/A) - Heather, RNFA as a surgical intervention .  The patient's history has been reviewed, patient examined, no change in status, stable for surgery.  I have reviewed the patient's chart and labs.  Questions were answered to the patient's satisfaction.     Valerie Cortez

## 2018-10-06 NOTE — Anesthesia Procedure Notes (Signed)
Spinal  Patient location during procedure: OB Start time: 10/06/2018 9:56 AM End time: 10/06/2018 10:06 AM Staffing Anesthesiologist: Trevor IhaHouser, Stephen A, MD Performed: anesthesiologist  Preanesthetic Checklist Completed: patient identified, surgical consent, pre-op evaluation, timeout performed, IV checked, risks and benefits discussed and monitors and equipment checked Spinal Block Patient position: sitting Prep: site prepped and draped and DuraPrep Patient monitoring: heart rate, cardiac monitor, continuous pulse ox and blood pressure Approach: midline Location: L3-4 Injection technique: single-shot Needle Needle type: Pencan  Needle gauge: 24 G Needle length: 10 cm Needle insertion depth: 6 cm Assessment Sensory level: T4 Additional Notes 2 attempt. Pt tolerated procedure

## 2018-10-07 LAB — CBC
HCT: 34.6 % — ABNORMAL LOW (ref 36.0–46.0)
Hemoglobin: 11.4 g/dL — ABNORMAL LOW (ref 12.0–15.0)
MCH: 29.3 pg (ref 26.0–34.0)
MCHC: 32.9 g/dL (ref 30.0–36.0)
MCV: 88.9 fL (ref 80.0–100.0)
PLATELETS: 132 10*3/uL — AB (ref 150–400)
RBC: 3.89 MIL/uL (ref 3.87–5.11)
RDW: 14.2 % (ref 11.5–15.5)
WBC: 10.6 10*3/uL — AB (ref 4.0–10.5)
nRBC: 0 % (ref 0.0–0.2)

## 2018-10-07 LAB — BIRTH TISSUE RECOVERY COLLECTION (PLACENTA DONATION)

## 2018-10-07 NOTE — Lactation Note (Signed)
This note was copied from a baby's chart. Lactation Consultation Note  Patient Name: Valerie Cortez XBJYN'WToday's Date: 10/07/2018 Reason for consult: Initial assessment;Term P3, 17 hour female infant. Per mom, BF is going well she finished BF infant prior LC entering room. Per mom, infant had 4 voids and 5 stools since birth. Mom has DEBP at home. Mom is knowledgeable about hand expression. Mom will BF according hunger cues, will not exceed 3 hours without BF infant. LC discussed I & O. Reviewed Baby & Me book's Breastfeeding Basics.  Mom will ask for assistance from Nurse or LC if she has any BF questions, concerns or need help with BF.  Mom made aware of O/P services, breastfeeding support groups, community resources, and our phone # for post-discharge questions.   Maternal Data Formula Feeding for Exclusion: No Has patient been taught Hand Expression?: Yes Does the patient have breastfeeding experience prior to this delivery?: Yes  Feeding    LATCH Score                   Interventions Interventions: Breast feeding basics reviewed;Skin to skin;Hand express  Lactation Tools Discussed/Used     Consult Status      Valerie Cortez 10/07/2018, 4:27 AM

## 2018-10-07 NOTE — Lactation Note (Signed)
This note was copied from a baby's chart. Lactation Consultation Note  Patient Name: Girl Prentiss Bellsara Coonradt UJWJX'BToday's Date: 10/07/2018 Reason for consult: Follow-up assessment;Infant weight loss;Term(LC updated several feeding on doc flow sheets and I/O )  Baby is 28 hours old, 4% weight loss,  As LC entered the room , baby recently breast fed 30 mins and mom reported swallows and comfort with feedings. Per mom feels breast feeding is going well.  LC updated the doc flow sheets per mom , and noted the baby to be consistent with feedings , voids and stools qs for age.  LC stressed the importance of STS feedings until the baby is back to birth weight and steadily gaining, and can stay awake for feedings.     Maternal Data    Feeding Feeding Type: (per mom just finished breast feeding / swallows noted )  LATCH Score                   Interventions Interventions: Breast feeding basics reviewed;Skin to skin(LC enc STS feedings )  Lactation Tools Discussed/Used     Consult Status Consult Status: Follow-up Date: 10/08/18 Follow-up type: In-patient    Matilde SprangMargaret Ann Jazlynn Nemetz 10/07/2018, 3:30 PM

## 2018-10-07 NOTE — Progress Notes (Signed)
Subjective: Postpartum Day #1: Cesarean Delivery Patient reports incisional pain, tolerating PO and no problems voiding.    Objective: Vital signs in last 24 hours: Temp:  [97.5 F (36.4 C)-99 F (37.2 C)] 98.8 F (37.1 C) (11/22 2250) Pulse Rate:  [61-81] 62 (11/22 2030) Resp:  [14-22] 18 (11/22 2030) BP: (94-111)/(50-78) 97/50 (11/22 2030) SpO2:  [95 %-100 %] 99 % (11/22 2250)  Physical Exam:  General: alert Lochia: appropriate Uterine Fundus: firm Incision: dressing C/D/I   Recent Labs    10/05/18 0935 10/07/18 0729  HGB 13.1 11.4*  HCT 39.5 34.6*    Assessment/Plan: Status post Cesarean section. Doing well postoperatively.  Continue current care, ambulate.  Leighton Roachodd D Santosha Jividen 10/07/2018, 10:31 AM

## 2018-10-08 MED ORDER — IBUPROFEN 600 MG PO TABS: 600.0000 mg | ORAL_TABLET | Freq: Four times a day (QID) | ORAL | 0 refills | Status: DC

## 2018-10-08 NOTE — Progress Notes (Signed)
POD #2 LTCS Doing well, feeling better today, wants to go home Afeb, VSS Abd- soft, fundus firm, incision intact D/c home

## 2018-10-08 NOTE — Discharge Instructions (Signed)
As per discharge pamphlet °

## 2018-10-08 NOTE — Lactation Note (Signed)
This note was copied from a baby's chart. Lactation Consultation Note  Patient Name: Valerie Cortez ZOXWR'UToday's Date: 10/08/2018 Reason for consult: Follow-up assessment;Term  P3 mother whose infant is now 1646 hours old.  Mother breast fed her other 2 children.  Mother had no questions/concerns related to breast feeding.  Her breasts are soft and non tender and nipples are intact with no breakdown noted.  Mother has experienced engorgement in the past and we reviewed engorgement prevention/treatment.  Manual pump with instructions for use provided.  Mother is familiar with hand expression and the medela pump and parts.  She will call our OP number for any questions/concerns after discharge.   Maternal Data Formula Feeding for Exclusion: No Has patient been taught Hand Expression?: Yes Does the patient have breastfeeding experience prior to this delivery?: Yes  Feeding    LATCH Score                   Interventions    Lactation Tools Discussed/Used WIC Program: No   Consult Status Consult Status: Complete Date: 10/08/18 Follow-up type: Call as needed    Natalia Wittmeyer R Lovelle Lema 10/08/2018, 9:14 AM

## 2018-10-08 NOTE — Discharge Summary (Signed)
OB Discharge Summary     Patient Name: Valerie Cortez DOB: [birth year] MRN: [id number]  Date of admission: 10/06/2018 Delivering MD: Pryor OchoaBANGA, [facility]ILIA [facility]   Date of discharge: 10/08/2018  Admitting diagnosis: repeat C-Section Intrauterine pregnancy: 4147w2d     Secondary diagnosis:  Active Problems:   Previous cesarean section   Term pregnancy   Encounter for sterilization   Postpartum care following cesarean delivery      Discharge diagnosis: Term Pregnancy Delivered                                   Hospital course:  Sceduled C/S   27 y.o. yo G3P3003 at 347w2d was admitted to the hospital 10/06/2018 for scheduled cesarean section with the following indication:Elective Repeat.  Membrane Rupture Time/Date: 10:33 AM ,10/06/2018   Patient delivered a Viable infant.10/06/2018  Details of operation can be found in separate operative note.  Pateint had an uncomplicated postpartum course.  She is ambulating, tolerating a regular diet, passing flatus, and urinating well. Patient is discharged home in stable condition on  10/08/18         Physical exam  Vitals:   10/06/18 2250 10/07/18 1400 10/07/18 2210 10/08/18 0607  BP:  102/60 (!) 110/58 105/68  Pulse:  70 60 62  Resp:  19 18 18   Temp: 98.8 F (37.1 C) 98.1 F (36.7 C) 98 F (36.7 C) 98 F (36.7 C)  TempSrc: Oral     SpO2: 99%     Weight:      Height:       General: alert Lochia: appropriate Uterine Fundus: firm Incision: Healing well with no significant drainage  Labs: Lab Results  Component Value Date   WBC 10.6 (H) 10/07/2018   HGB 11.4 (L) 10/07/2018   HCT 34.6 (L) 10/07/2018   MCV 88.9 10/07/2018   PLT 132 (L) 10/07/2018   CMP Latest Ref Rng & Units 01/25/2015  Glucose 70 - 99 mg/dL 86  BUN 6 - 23 mg/dL 10  Creatinine 4.090.50 - 8.111.10 mg/dL 9.140.63  Sodium 782135 - 956145 mmol/L 135  Potassium 3.5 - 5.1 mmol/L 3.3(L)  Chloride 96 - 112 mmol/L 104  CO2 19 - 32 mmol/L 25  Calcium 8.4 - 10.5 mg/dL 9.1  Total Protein 6.0 -  8.3 g/dL 7.6  Total Bilirubin 0.3 - 1.2 mg/dL 0.5  Alkaline Phos 39 - 117 U/L 114  AST 0 - 37 U/L 25  ALT 0 - 35 U/L 52(H)    Discharge instruction: per After Visit Summary and "Baby and Me Booklet".  After visit meds:  Allergies as of 10/08/2018      Reactions   Fentanyl Itching, Rash, Other (See Comments)   "flushed" feeling   Benadryl [diphenhydramine Hcl] Other (See Comments)   "wakes me up"   Peanut-containing Drug Products Hives   Red Dye Nausea And Vomiting, Other (See Comments)   Red food coloring   Oxycodone Hives, Rash      Medication List    TAKE these medications   acetaminophen 500 MG tablet Commonly known as:  TYLENOL Take 500 mg by mouth 2 (two) times daily as needed for moderate pain or headache.   brompheniramine-pseudoephedrine 1-15 MG/5ML Elix Commonly known as:  DIMETAPP Take 20 mLs by mouth every 4 (four) hours as needed for allergies.   ibuprofen 600 MG tablet Commonly known as:  ADVIL,MOTRIN Take 1 tablet (600 mg total)  by mouth every 6 (six) hours.   prenatal multivitamin Tabs tablet Take 1 tablet by mouth daily.       Diet: routine diet  Activity: Advance as tolerated. Pelvic rest for 6 weeks.   Outpatient follow up:2 weeks  Newborn Data: Live born female  Birth Weight: 7 lb 13.8 oz (3565 g) APGAR: 9, 9  Newborn Delivery   Birth date/time:  10/06/2018 10:33:00 Delivery type:  C-Section, Low Transverse Trial of labor:  No C-section categorization:  Repeat     Baby Feeding: Breast Disposition:home with mother   10/08/2018 Zenaida Niece, MD

## 2018-12-25 DIAGNOSIS — N939 Abnormal uterine and vaginal bleeding, unspecified: Secondary | ICD-10-CM | POA: Diagnosis not present

## 2018-12-25 DIAGNOSIS — R1031 Right lower quadrant pain: Secondary | ICD-10-CM | POA: Diagnosis not present

## 2019-06-25 DIAGNOSIS — N939 Abnormal uterine and vaginal bleeding, unspecified: Secondary | ICD-10-CM | POA: Diagnosis not present

## 2019-06-27 DIAGNOSIS — Z124 Encounter for screening for malignant neoplasm of cervix: Secondary | ICD-10-CM | POA: Diagnosis not present

## 2019-06-27 DIAGNOSIS — R635 Abnormal weight gain: Secondary | ICD-10-CM | POA: Diagnosis not present

## 2019-06-27 DIAGNOSIS — N859 Noninflammatory disorder of uterus, unspecified: Secondary | ICD-10-CM | POA: Diagnosis not present

## 2019-06-27 DIAGNOSIS — Z6832 Body mass index (BMI) 32.0-32.9, adult: Secondary | ICD-10-CM | POA: Diagnosis not present

## 2019-06-27 DIAGNOSIS — N926 Irregular menstruation, unspecified: Secondary | ICD-10-CM | POA: Diagnosis not present

## 2019-06-27 DIAGNOSIS — Z1389 Encounter for screening for other disorder: Secondary | ICD-10-CM | POA: Diagnosis not present

## 2019-06-27 DIAGNOSIS — Z1151 Encounter for screening for human papillomavirus (HPV): Secondary | ICD-10-CM | POA: Diagnosis not present

## 2019-06-27 DIAGNOSIS — Z01419 Encounter for gynecological examination (general) (routine) without abnormal findings: Secondary | ICD-10-CM | POA: Diagnosis not present

## 2019-08-06 DIAGNOSIS — J4540 Moderate persistent asthma, uncomplicated: Secondary | ICD-10-CM | POA: Diagnosis not present

## 2019-08-06 DIAGNOSIS — J3089 Other allergic rhinitis: Secondary | ICD-10-CM | POA: Diagnosis not present

## 2019-08-06 DIAGNOSIS — K219 Gastro-esophageal reflux disease without esophagitis: Secondary | ICD-10-CM | POA: Diagnosis not present

## 2019-09-24 DIAGNOSIS — Z23 Encounter for immunization: Secondary | ICD-10-CM | POA: Diagnosis not present

## 2019-09-28 DIAGNOSIS — N926 Irregular menstruation, unspecified: Secondary | ICD-10-CM | POA: Diagnosis not present

## 2019-09-28 DIAGNOSIS — N939 Abnormal uterine and vaginal bleeding, unspecified: Secondary | ICD-10-CM | POA: Diagnosis not present

## 2019-09-28 DIAGNOSIS — R9389 Abnormal findings on diagnostic imaging of other specified body structures: Secondary | ICD-10-CM | POA: Diagnosis not present

## 2019-11-21 DIAGNOSIS — H6981 Other specified disorders of Eustachian tube, right ear: Secondary | ICD-10-CM | POA: Diagnosis not present

## 2019-11-21 DIAGNOSIS — Z6835 Body mass index (BMI) 35.0-35.9, adult: Secondary | ICD-10-CM | POA: Diagnosis not present

## 2020-01-14 DIAGNOSIS — Z01818 Encounter for other preprocedural examination: Secondary | ICD-10-CM | POA: Diagnosis not present

## 2020-01-14 DIAGNOSIS — N946 Dysmenorrhea, unspecified: Secondary | ICD-10-CM | POA: Diagnosis not present

## 2020-01-14 DIAGNOSIS — R232 Flushing: Secondary | ICD-10-CM | POA: Diagnosis not present

## 2020-01-14 DIAGNOSIS — N921 Excessive and frequent menstruation with irregular cycle: Secondary | ICD-10-CM | POA: Diagnosis not present

## 2020-01-14 NOTE — Patient Instructions (Addendum)
YOU ARE SCHEDULED FOR A COVID TEST __Friday 03/05/2021_______@___10 :30 am_________. THIS TEST MUST BE DONE BEFORE SURGERY. GO TO  [address], [place], [address] AND REMAIN IN YOUR CAR, THIS IS A DRIVE UP TEST. ONCE YOUR COVID TEST IS DONE PLEASE FOLLOW ALL THE QUARANTINE  INSTRUCTIONS GIVEN IN YOUR HANDOUT.      Your procedure is scheduled on Tuesday 01/22/2020    Report to [facility] AT  0600   A. M.   Call this number if you have problems the morning of surgery  :[phone].   OUR ADDRESS IS [address].  [facility] ARE LOCATED IN [facility].       How to Manage Your Diabetes  Before and After Surgery  Why is it important to control my blood sugar before and after surgery? . Improving blood sugar levels before and after surgery helps healing and can limit problems. . A way of improving blood sugar control is eating a healthy diet by: o  Eating less sugar and carbohydrates o  Increasing activity/exercise o  Talking with your doctor about reaching your blood sugar goals . High blood sugars (greater than 180 mg/dL) can raise your risk of infections and slow your recovery, so you will need to focus on controlling your diabetes during the weeks before surgery. . Make sure that the doctor who takes care of your diabetes knows about your planned surgery including the date and location.  How do I manage my blood sugar before surgery? . Check your blood sugar at least 4 times a day, starting 2 days before surgery, to make sure that the level is not too high or low. o Check your blood sugar the morning of your surgery when you wake up and every 2 hours until you get to the Short Stay unit. . If your blood sugar is less than 70 mg/dL, you will need to treat for low blood sugar: o Do not take insulin. o Treat a low blood sugar (less than 70 mg/dL) with  cup of clear juice (cranberry or apple), 4 glucose tablets, OR glucose gel. o Recheck blood  sugar in 15 minutes after treatment (to make sure it is greater than 70 mg/dL). If your blood sugar is not greater than 70 mg/dL on recheck, call [phone] for further instructions. . Report your blood sugar to the short stay nurse when you get to Short Stay.  . If you are admitted to the hospital after surgery: o Your blood sugar will be checked by the staff and you will probably be given insulin after surgery (instead of oral diabetes medicines) to make sure you have good blood sugar levels. o The goal for blood sugar control after surgery is 80-180 mg/dL.   WHAT DO I DO ABOUT MY DIABETES MEDICATION?        The day before surgery, Take Metformin (Glucophage as prescribed!  . Do not take oral diabetes medicines (pills) the morning of surgery.                             REMEMBER:  DO NOT EAT FOOD OR DRINK LIQUIDS AFTER MIDNIGHT .     YOU MAY  BRUSH YOUR TEETH MORNING OF SURGERY AND RINSE YOUR MOUTH OUT, NO CHEWING GUM CANDY OR MINTS.    TAKE THESE MEDICATIONS MORNING OF SURGERY WITH A SIP OF WATER:  Use Nasacort nasal spray   IF  YOU ARE SPENDING THE NIGHT AFTER SURGERY PLEASE BRING ALL YOUR PRESCRIPTION MEDICATIONS IN THEIR ORIGINAL BOTTLES.    1 VISITOR IS ALLOWED IN WAITING ROOM ONLY DAY OF SURGERY. NO VISITOR MAY SPEND THE NIGHT. VISITOR ARE ALLOWED TO STAY UNTIL 800 PM.                                    DO NOT WEAR JEWERLY, MAKE UP, OR NAIL POLISH ON FINGERNAILS. DO NOT WEAR LOTIONS, POWDERS, PERFUMES OR DEODORANT. DO NOT SHAVE FOR 24 HOURS PRIOR TO DAY OF SURGERY.  CONTACTS, GLASSES, OR DENTURES MAY NOT BE WORN TO SURGERY.                                    [facility] IS NOT RESPONSIBLE  FOR ANY BELONGINGS.                                                                    Marland Kitchen                                                                                                    [facility] - Preparing for Surgery Before surgery, you can play an important role.  Because  skin is not sterile, your skin needs to be as free of germs as possible.  You can reduce the number of germs on your skin by washing with CHG (chlorahexidine gluconate) soap before surgery.  CHG is an antiseptic cleaner which kills germs and bonds with the skin to continue killing germs even after washing. Please DO NOT use if you have an allergy to CHG or antibacterial soaps.  If your skin becomes reddened/irritated stop using the CHG and inform your nurse when you arrive at Short Stay. Do not shave (including legs and underarms) for at least 48 hours prior to the first CHG shower.  You may shave your face/neck. Please follow these instructions carefully:  1.  Shower with CHG Soap the night before surgery and the  morning of Surgery.  2.  If you choose to wash your hair, wash your hair first as usual with your  normal  shampoo.  3.  After you shampoo, rinse your hair and body thoroughly to remove the  shampoo.                           4.  Use CHG as you would any other liquid soap.  You can apply chg directly  to the skin and wash                       Gently with a scrungie or  clean washcloth.  5.  Apply the CHG Soap to your body ONLY FROM THE NECK DOWN.   Do not use on face/ open                           Wound or open sores. Avoid contact with eyes, ears mouth and genitals (private parts).                       Wash face,  Genitals (private parts) with your normal soap.             6.  Wash thoroughly, paying special attention to the area where your surgery  will be performed.  7.  Thoroughly rinse your body with warm water from the neck down.  8.  DO NOT shower/wash with your normal soap after using and rinsing off  the CHG Soap.                9.  Pat yourself dry with a clean towel.            10.  Wear clean pajamas.            11.  Place clean sheets on your bed the night of your first shower and do not  sleep with pets. Day of Surgery : Do not apply any lotions/deodorants the morning of  surgery.  Please wear clean clothes to the hospital/surgery center.  FAILURE TO FOLLOW THESE INSTRUCTIONS MAY RESULT IN THE CANCELLATION OF YOUR SURGERY PATIENT SIGNATURE_________________________________  NURSE SIGNATURE__________________________________  ________________________________________________________________________   Rogelia Mire  An incentive spirometer is a tool that can help keep your lungs clear and active. This tool measures how well you are filling your lungs with each breath. Taking long deep breaths may help reverse or decrease the chance of developing breathing (pulmonary) problems (especially infection) following:  A long period of time when you are unable to move or be active. BEFORE THE PROCEDURE   If the spirometer includes an indicator to show your best effort, your nurse or respiratory therapist will set it to a desired goal.  If possible, sit up straight or lean slightly forward. Try not to slouch.  Hold the incentive spirometer in an upright position. INSTRUCTIONS FOR USE  1. Sit on the edge of your bed if possible, or sit up as far as you can in bed or on a chair. 2. Hold the incentive spirometer in an upright position. 3. Breathe out normally. 4. Place the mouthpiece in your mouth and seal your lips tightly around it. 5. Breathe in slowly and as deeply as possible, raising the piston or the ball toward the top of the column. 6. Hold your breath for 3-5 seconds or for as long as possible. Allow the piston or ball to fall to the bottom of the column. 7. Remove the mouthpiece from your mouth and breathe out normally. 8. Rest for a few seconds and repeat Steps 1 through 7 at least 10 times every 1-2 hours when you are awake. Take your time and take a few normal breaths between deep breaths. 9. The spirometer may include an indicator to show your best effort. Use the indicator as a goal to work toward during each repetition. 10. After each set of 10  deep breaths, practice coughing to be sure your lungs are clear. If you have an incision (the cut made at the time of surgery), support your incision when  coughing by placing a pillow or rolled up towels firmly against it. Once you are able to get out of bed, walk around indoors and cough well. You may stop using the incentive spirometer when instructed by your caregiver.  RISKS AND COMPLICATIONS  Take your time so you do not get dizzy or light-headed.  If you are in pain, you may need to take or ask for pain medication before doing incentive spirometry. It is harder to take a deep breath if you are having pain. AFTER USE  Rest and breathe slowly and easily.  It can be helpful to keep track of a log of your progress. Your caregiver can provide you with a simple table to help with this. If you are using the spirometer at home, follow these instructions: [place] IF:   You are having difficultly using the spirometer.  You have trouble using the spirometer as often as instructed.  Your pain medication is not giving enough relief while using the spirometer.  You develop fever of 100.5 F (38.1 C) or higher. SEEK IMMEDIATE MEDICAL CARE IF:   You cough up bloody sputum that had not been present before.  You develop fever of 102 F (38.9 C) or greater.  You develop worsening pain at or near the incision site. MAKE SURE YOU:   Understand these instructions.  Will watch your condition.  Will get help right away if you are not doing well or get worse. Document Released: 03/14/2007 Document Revised: 01/24/2012 Document Reviewed: 05/15/2007 Exit[employer] Patient Information 2014 ExitCare, [place].   ________________________________________________________________________  WHAT IS A BLOOD TRANSFUSION? Blood Transfusion Information  A transfusion is the replacement of blood or some of its parts. Blood is made up of multiple cells which provide different functions.  Red blood cells  carry oxygen and are used for blood loss replacement.  White blood cells fight against infection.  Platelets control bleeding.  Plasma helps clot blood.  Other blood products are available for specialized needs, such as hemophilia or other clotting disorders. BEFORE THE TRANSFUSION  Who gives blood for transfusions?   Healthy volunteers who are fully evaluated to make sure their blood is safe. This is blood bank blood. Transfusion therapy is the safest it has ever been in the practice of medicine. Before blood is taken from a donor, a complete history is taken to make sure that person has no history of diseases nor engages in risky social behavior (examples are intravenous drug use or sexual activity with multiple partners). The donor's travel history is screened to minimize risk of transmitting infections, such as malaria. The donated blood is tested for signs of infectious diseases, such as HIV and hepatitis. The blood is then tested to be sure it is compatible with you in order to minimize the chance of a transfusion reaction. If you or a relative donates blood, this is often done in anticipation of surgery and is not appropriate for emergency situations. It takes many days to process the donated blood. RISKS AND COMPLICATIONS Although transfusion therapy is very safe and saves many lives, the main dangers of transfusion include:   Getting an infectious disease.  Developing a transfusion reaction. This is an allergic reaction to something in the blood you were given. Every precaution is taken to prevent this. The decision to have a blood transfusion has been considered carefully by your caregiver before blood is given. Blood is not given unless the benefits outweigh the risks. AFTER THE TRANSFUSION  Right after receiving a  blood transfusion, you will usually feel much better and more energetic. This is especially true if your red blood cells have gotten low (anemic). The transfusion raises  the level of the red blood cells which carry oxygen, and this usually causes an energy increase.  The nurse administering the transfusion will monitor you carefully for complications. HOME CARE INSTRUCTIONS  No special instructions are needed after a transfusion. You may find your energy is better. Speak with your caregiver about any limitations on activity for underlying diseases you may have. SEEK MEDICAL CARE IF:   Your condition is not improving after your transfusion.  You develop redness or irritation at the intravenous (IV) site. SEEK IMMEDIATE MEDICAL CARE IF:  Any of the following symptoms occur over the next 12 hours:  Shaking chills.  You have a temperature by mouth above 102 F (38.9 C), not controlled by medicine.  Chest, back, or muscle pain.  People around you feel you are not acting correctly or are confused.  Shortness of breath or difficulty breathing.  Dizziness and fainting.  You get a rash or develop hives.  You have a decrease in urine output.  Your urine turns a dark color or changes to pink, red, or brown. Any of the following symptoms occur over the next 10 days:  You have a temperature by mouth above 102 F (38.9 C), not controlled by medicine.  Shortness of breath.  Weakness after normal activity.  The white part of the eye turns yellow (jaundice).  You have a decrease in the amount of urine or are urinating less often.  Your urine turns a dark color or changes to pink, red, or brown. Document Released: 10/29/2000 Document Revised: 01/24/2012 Document Reviewed: 06/17/2008 [facility] Patient Information 2014 [place], [place].  _______________________________________________________________________

## 2020-01-15 ENCOUNTER — Encounter (HOSPITAL_COMMUNITY): Payer: Self-pay | Admitting: *Deleted

## 2020-01-15 ENCOUNTER — Other Ambulatory Visit: Payer: Self-pay

## 2020-01-15 ENCOUNTER — Encounter (HOSPITAL_COMMUNITY)
Admission: RE | Admit: 2020-01-15 | Discharge: 2020-01-15 | Disposition: A | Discharge status: Home / Self Care | Payer: BC Managed Care – PPO | Source: Ambulatory Visit | Attending: Obstetrics and Gynecology | Admitting: Obstetrics and Gynecology

## 2020-01-15 DIAGNOSIS — Z01818 Encounter for other preprocedural examination: Secondary | ICD-10-CM | POA: Insufficient documentation

## 2020-01-15 HISTORY — DX: Polycystic ovarian syndrome: E28.2

## 2020-01-15 HISTORY — DX: Other complications of anesthesia, initial encounter: T88.59XA

## 2020-01-15 NOTE — Progress Notes (Signed)
PCP - Sharrie Rothman Cardiologist - n/a  Chest x-ray - n/a EKG - n/a Stress Test - n/a ECHO -n/a  Cardiac Cath - n/a  Sleep Study - n/a CPAP - n/a  Fasting Blood Sugar - n/a Checks Blood Sugar __0___ times a day  Blood Thinner Instructions:n/a Aspirin Instructions: Stopped Advil 4 days ago-01/11/2020 Last Dose:01/11/2020  Anesthesia review:   Patient has a history of PCOS, anemia,  And asthma. Per patient she states that she has a difficult time with Benadryl if given pre-op as it has the opposite effect as she woke up during her colonoscopy.  Patient denies shortness of breath, fever, cough and chest pain at PAT appointment   Patient verbalized understanding of instructions that were given to them at the PAT appointment. Patient was also instructed that they will need to review over the PAT instructions again at home before surgery.

## 2020-01-17 ENCOUNTER — Encounter (HOSPITAL_COMMUNITY): Admission: RE | Admit: 2020-01-17 | Payer: BC Managed Care – PPO | Source: Ambulatory Visit

## 2020-01-18 ENCOUNTER — Other Ambulatory Visit (HOSPITAL_COMMUNITY): Payer: BC Managed Care – PPO

## 2020-01-18 ENCOUNTER — Encounter (HOSPITAL_COMMUNITY)
Admission: RE | Admit: 2020-01-18 | Discharge: 2020-01-18 | Disposition: A | Discharge status: Home / Self Care | Payer: BC Managed Care – PPO | Source: Ambulatory Visit | Attending: Obstetrics and Gynecology | Admitting: Obstetrics and Gynecology

## 2020-01-18 ENCOUNTER — Other Ambulatory Visit (HOSPITAL_COMMUNITY)
Admission: RE | Admit: 2020-01-18 | Discharge: 2020-01-18 | Disposition: A | Discharge status: Home / Self Care | Payer: BC Managed Care – PPO | Source: Ambulatory Visit | Attending: Obstetrics and Gynecology | Admitting: Obstetrics and Gynecology

## 2020-01-18 ENCOUNTER — Other Ambulatory Visit: Payer: Self-pay

## 2020-01-18 DIAGNOSIS — Z01812 Encounter for preprocedural laboratory examination: Secondary | ICD-10-CM | POA: Diagnosis not present

## 2020-01-18 DIAGNOSIS — Z20822 Contact with and (suspected) exposure to covid-19: Secondary | ICD-10-CM | POA: Diagnosis not present

## 2020-01-18 LAB — CBC
HCT: 42.7 % (ref 36.0–46.0)
Hemoglobin: 13.8 g/dL (ref 12.0–15.0)
MCH: 27.8 pg (ref 26.0–34.0)
MCHC: 32.3 g/dL (ref 30.0–36.0)
MCV: 85.9 fL (ref 80.0–100.0)
Platelets: 189 10*3/uL (ref 150–400)
RBC: 4.97 MIL/uL (ref 3.87–5.11)
RDW: 12.6 % (ref 11.5–15.5)
WBC: 6.9 10*3/uL (ref 4.0–10.5)
nRBC: 0 % (ref 0.0–0.2)

## 2020-01-18 LAB — BASIC METABOLIC PANEL
Anion gap: 7 (ref 5–15)
BUN: 12 mg/dL (ref 6–20)
CO2: 27 mmol/L (ref 22–32)
Calcium: 9.1 mg/dL (ref 8.9–10.3)
Chloride: 105 mmol/L (ref 98–111)
Creatinine, Ser: 0.6 mg/dL (ref 0.44–1.00)
GFR calc Af Amer: >60 mL/min (ref >60)
GFR calc non Af Amer: >60 mL/min (ref >60)
Glucose, Bld: 86 mg/dL (ref 70–99)
Potassium: 3.8 mmol/L (ref 3.5–5.1)
Sodium: 139 mmol/L (ref 135–145)

## 2020-01-18 LAB — SARS CORONAVIRUS 2 (TAT 6-24 HRS): SARS Coronavirus 2: NEGATIVE

## 2020-01-18 LAB — ABO/RH: ABO/RH(D): AB POS

## 2020-01-21 NOTE — Anesthesia Preprocedure Evaluation (Addendum)
Anesthesia Evaluation  Patient identified by MRN, date of birth, ID band Patient awake    Reviewed: Allergy & Precautions  History of Anesthesia Complications (+) PONV  Airway Mallampati: II  TM Distance: >3 FB     Dental   Pulmonary pneumonia,    breath sounds clear to auscultation       Cardiovascular negative cardio ROS   Rhythm:Regular Rate:Normal     Neuro/Psych    GI/Hepatic Neg liver ROS, GERD  ,  Endo/Other    Renal/GU negative Renal ROS     Musculoskeletal   Abdominal   Peds  Hematology  (+) anemia ,   Anesthesia Other Findings   Reproductive/Obstetrics                           Anesthesia Physical Anesthesia Plan  ASA: II  Anesthesia Plan: General   Post-op Pain Management:    Induction: Intravenous  PONV Risk Score and Plan: 3 and Ondansetron and Midazolam  Airway Management Planned: Oral ETT  Additional Equipment:   Intra-op Plan:   Post-operative Plan: Extubation in OR  Informed Consent: I have reviewed the patients History and Physical, chart, labs and discussed the procedure including the risks, benefits and alternatives for the proposed anesthesia with the patient or authorized representative who has indicated his/her understanding and acceptance.     Dental advisory given  Plan Discussed with: Anesthesiologist and CRNA  Anesthesia Plan Comments:       Anesthesia Quick Evaluation

## 2020-01-21 NOTE — H&P (Signed)
Valerie Cortez is a 29yo [phone] here for definitive scheduled surgery due to longstanding dysmenorrhea and menorrhagia. She has completed childbearing and is aware of alternate options such as IUDs. Hx of c/s x 3. Family history of ovarian and uterine cancer. She also complains of hot flushes and weight gain. Pt desires total laparoscopic hysterectomy with unilateral right oopherectomy and cystoscopy; possible open   Pertinent Gynecological History: Menses: flow is excessive with use of 5-6 pads or tampons on heaviest days Bleeding: heavy and painful Contraception: none DES exposure: denies Blood transfusions: none Sexually transmitted diseases: no past history Previous GYN Procedures: n/a  Last mammogram: n/a Date:  Last pap: normal Date: 06/2019 OB History: G3, P3003   Menstrual History: Menarche age: 9 Patient's last menstrual period was 12/23/2019 (approximate).    Past Medical History:  Diagnosis Date  . Anemia   . Asthma    as a child - does not use inhaler-01/15/2020-no problems at all  . Complication of anesthesia    during Endoscopy , when given Benadryl, it has opposite affect and patient wakes up.  . Constipation    treated with diet change  . Encounter for sterilization 10/06/2018  . GERD (gastroesophageal reflux disease)    diet controlled - no meds  . Headache(784.0)    Hx last migraine 2 yrs ago  . Hx of chlamydia infection    resolved  . Hx of eczema   . Hypotension    Hx prior to first preg 09/2012, no longer an issue per patient  . Pancreatitis    History - resolved 12/2012  . PCOS (polycystic ovarian syndrome)    on Metformin to regulate periods as ovarys not functioning as well and making her gain weight  . Pneumonia    HX     Past Surgical History:  Procedure Laterality Date  . CESAREAN SECTION  10/04/2012   Procedure: CESAREAN SECTION;  Surgeon: Melina Schools, MD;  Location: [facility];  Service: Obstetrics;  Laterality: N/A;  . CESAREAN SECTION N/A  08/28/2014   Procedure: REPEAT CESAREAN SECTION;  Surgeon: Melina Schools, MD;  Location: [facility];  Service: Obstetrics;  Laterality: N/A;  . CESAREAN SECTION WITH BILATERAL TUBAL LIGATION N/A 10/06/2018   Procedure: REPEAT CESAREAN SECTION WITH BILATERAL TUBAL LIGATION;  Surgeon: Sherlyn Hay, DO;  Location: [facility];  Service: Obstetrics;  Laterality: N/A;  Heather, RNFA  . CHOLECYSTECTOMY  12/02/2012   Procedure: LAPAROSCOPIC CHOLECYSTECTOMY WITH INTRAOPERATIVE CHOLANGIOGRAM;  Surgeon: Leighton Ruff, MD;  Location: [facility];  Service: General;  Laterality: N/A;  . ERCP  12/04/2012   Procedure: ENDOSCOPIC RETROGRADE CHOLANGIOPANCREATOGRAPHY (ERCP);  Surgeon: Missy Sabins, MD;  Location: [facility];  Service: Endoscopy;  Laterality: N/A;  . ERCP  12/05/2012   Procedure: ENDOSCOPIC RETROGRADE CHOLANGIOPANCREATOGRAPHY (ERCP);  Surgeon: Missy Sabins, MD;  Location: [facility];  Service: Endoscopy;  Laterality: N/A;  . EUS N/A 01/24/2013   Procedure: ESOPHAGEAL ENDOSCOPIC ULTRASOUND (EUS) RADIAL;  Surgeon: Arta Silence, MD;  Location: [facility];  Service: Endoscopy;  Laterality: N/A;  . WISDOM TOOTH EXTRACTION      Family History  Problem Relation Age of Onset  . Cerebral palsy Mother   . Fibroids Mother   . Diverticulitis Mother   . Migraines Mother   . Asthma Brother   . Cancer Maternal Grandmother        ovarian  . COPD Maternal Grandfather   . Diabetes Maternal Grandfather   . Heart disease Maternal Grandfather  heart valve  . Hypertension Maternal Grandfather   . Cancer Paternal Grandfather        lung    Social History:  reports that she has never smoked. She has never used smokeless tobacco. She reports that she does not drink alcohol or use drugs.  Allergies:  Allergies  Allergen Reactions  . Fentanyl Itching, Rash and Other (See Comments)    "flushed" feeling  . Benadryl [Diphenhydramine Hcl] Other (See Comments)    "wakes me up"  .  Peanut-Containing Drug Products Hives  . Red Dye Nausea And Vomiting and Other (See Comments)    Red food coloring  . Oxycodone Hives and Rash    No medications prior to admission.    Review of Systems  Constitutional: Positive for fatigue.  Respiratory: Negative for chest tightness and shortness of breath.   Cardiovascular: Negative for chest pain and leg swelling.  Gastrointestinal: Positive for abdominal pain.  Genitourinary: Positive for menstrual problem.  Neurological: Negative for headaches.  Psychiatric/Behavioral: The patient is not nervous/anxious.     Last menstrual period 12/23/2019, unknown if currently breastfeeding. Physical Exam  Constitutional: She is oriented to person, place, and time. She appears well-developed and well-nourished.  Cardiovascular: Normal rate.  Respiratory: Effort normal.  GI: Soft.  Genitourinary:    Vagina and uterus normal.   Musculoskeletal:        General: Normal range of motion.     Cervical back: Normal range of motion.  Neurological: She is alert and oriented to person, place, and time.  Skin: Skin is warm.  Psychiatric: She has a normal mood and affect. Her behavior is normal. Judgment and thought content normal.    No results found for this or any previous visit (from the past 24 hour(s)).  No results found.  Assessment/Plan: 28yo G3P3003 female with longstanding dysmenorrhea, menorrhagia and pelvic pain for definitive surgery via  total laparoscopic hysterectomy with unilateral right oopherectomy and cystoscopy; possible open  - Admit - Confirm consent for procedure - To OR when ready  Janean Sark Orrin Yurkovich 01/21/2020, 8:30 PM

## 2020-01-22 ENCOUNTER — Ambulatory Visit (HOSPITAL_BASED_OUTPATIENT_CLINIC_OR_DEPARTMENT_OTHER): Payer: BC Managed Care – PPO | Admitting: Anesthesiology

## 2020-01-22 ENCOUNTER — Ambulatory Visit (HOSPITAL_BASED_OUTPATIENT_CLINIC_OR_DEPARTMENT_OTHER): Payer: BC Managed Care – PPO | Admitting: Physician Assistant

## 2020-01-22 ENCOUNTER — Encounter (HOSPITAL_BASED_OUTPATIENT_CLINIC_OR_DEPARTMENT_OTHER): Payer: Self-pay | Admitting: Obstetrics and Gynecology

## 2020-01-22 ENCOUNTER — Encounter (HOSPITAL_BASED_OUTPATIENT_CLINIC_OR_DEPARTMENT_OTHER)
Admission: RE | Disposition: A | Discharge status: Home / Self Care | Payer: Self-pay | Source: Home / Self Care | Attending: Obstetrics and Gynecology

## 2020-01-22 ENCOUNTER — Other Ambulatory Visit: Payer: Self-pay

## 2020-01-22 ENCOUNTER — Ambulatory Visit (HOSPITAL_BASED_OUTPATIENT_CLINIC_OR_DEPARTMENT_OTHER)
Admission: RE | Admit: 2020-01-22 | Discharge: 2020-01-23 | Disposition: A | Discharge status: Home / Self Care | Payer: BC Managed Care – PPO | Attending: Obstetrics and Gynecology | Admitting: Obstetrics and Gynecology

## 2020-01-22 DIAGNOSIS — Z888 Allergy status to other drugs, medicaments and biological substances status: Secondary | ICD-10-CM | POA: Insufficient documentation

## 2020-01-22 DIAGNOSIS — Z7984 Long term (current) use of oral hypoglycemic drugs: Secondary | ICD-10-CM | POA: Diagnosis not present

## 2020-01-22 DIAGNOSIS — N926 Irregular menstruation, unspecified: Secondary | ICD-10-CM | POA: Diagnosis not present

## 2020-01-22 DIAGNOSIS — E282 Polycystic ovarian syndrome: Secondary | ICD-10-CM | POA: Diagnosis not present

## 2020-01-22 DIAGNOSIS — Z8041 Family history of malignant neoplasm of ovary: Secondary | ICD-10-CM | POA: Insufficient documentation

## 2020-01-22 DIAGNOSIS — K660 Peritoneal adhesions (postprocedural) (postinfection): Secondary | ICD-10-CM | POA: Diagnosis not present

## 2020-01-22 DIAGNOSIS — K219 Gastro-esophageal reflux disease without esophagitis: Secondary | ICD-10-CM | POA: Diagnosis not present

## 2020-01-22 DIAGNOSIS — Z885 Allergy status to narcotic agent status: Secondary | ICD-10-CM | POA: Diagnosis not present

## 2020-01-22 DIAGNOSIS — N921 Excessive and frequent menstruation with irregular cycle: Secondary | ICD-10-CM | POA: Diagnosis not present

## 2020-01-22 DIAGNOSIS — Z8709 Personal history of other diseases of the respiratory system: Secondary | ICD-10-CM | POA: Insufficient documentation

## 2020-01-22 DIAGNOSIS — N946 Dysmenorrhea, unspecified: Secondary | ICD-10-CM | POA: Insufficient documentation

## 2020-01-22 DIAGNOSIS — Z8701 Personal history of pneumonia (recurrent): Secondary | ICD-10-CM | POA: Diagnosis not present

## 2020-01-22 DIAGNOSIS — Z9102 Food additives allergy status: Secondary | ICD-10-CM | POA: Insufficient documentation

## 2020-01-22 DIAGNOSIS — D649 Anemia, unspecified: Secondary | ICD-10-CM | POA: Diagnosis not present

## 2020-01-22 DIAGNOSIS — N734 Female chronic pelvic peritonitis: Secondary | ICD-10-CM | POA: Diagnosis not present

## 2020-01-22 DIAGNOSIS — G8929 Other chronic pain: Secondary | ICD-10-CM | POA: Insufficient documentation

## 2020-01-22 DIAGNOSIS — Z91013 Allergy to seafood: Secondary | ICD-10-CM | POA: Insufficient documentation

## 2020-01-22 DIAGNOSIS — Z90722 Acquired absence of ovaries, bilateral: Secondary | ICD-10-CM

## 2020-01-22 DIAGNOSIS — Z8543 Personal history of malignant neoplasm of ovary: Secondary | ICD-10-CM | POA: Diagnosis not present

## 2020-01-22 DIAGNOSIS — Z90710 Acquired absence of both cervix and uterus: Secondary | ICD-10-CM

## 2020-01-22 HISTORY — DX: Other specified postprocedural states: Z98.890

## 2020-01-22 HISTORY — PX: CYSTOSCOPY: SHX5120

## 2020-01-22 HISTORY — DX: Other specified postprocedural states: R11.2

## 2020-01-22 HISTORY — PX: LAPAROSCOPIC LYSIS OF ADHESIONS: SHX5905

## 2020-01-22 HISTORY — PX: TOTAL LAPAROSCOPIC HYSTERECTOMY WITH BILATERAL SALPINGO OOPHORECTOMY: SHX6845

## 2020-01-22 LAB — TYPE AND SCREEN
ABO/RH(D): AB POS
Antibody Screen: NEGATIVE

## 2020-01-22 LAB — POCT PREGNANCY, URINE: Preg Test, Ur: NEGATIVE

## 2020-01-22 SURGERY — HYSTERECTOMY, TOTAL, LAPAROSCOPIC, WITH BILATERAL SALPINGO-OOPHORECTOMY
Anesthesia: General | Site: Bladder

## 2020-01-22 MED ORDER — ALUM & MAG HYDROXIDE-SIMETH 200-200-20 MG/5ML PO SUSP
30.0000 mL | ORAL | Status: DC | PRN
  Administered 2020-01-22: 30 mL via ORAL
  Filled 2020-01-22 (×3): qty 30

## 2020-01-22 MED ORDER — SCOPOLAMINE 1 MG/3DAYS TD PT72
MEDICATED_PATCH | TRANSDERMAL | Status: AC
  Filled 2020-01-22: qty 1

## 2020-01-22 MED ORDER — HYDROXYZINE HCL 25 MG PO TABS
25.0000 mg | ORAL_TABLET | Freq: Four times a day (QID) | ORAL | Status: DC | PRN
  Administered 2020-01-22: 25 mg via ORAL
  Filled 2020-01-22 (×2): qty 1

## 2020-01-22 MED ORDER — SENNA 8.6 MG PO TABS
1.0000 | ORAL_TABLET | Freq: Two times a day (BID) | ORAL | Status: DC
  Administered 2020-01-22: 8.6 mg via ORAL
  Filled 2020-01-22: qty 1

## 2020-01-22 MED ORDER — FENTANYL CITRATE (PF) 100 MCG/2ML IJ SOLN
25.0000 ug | INTRAMUSCULAR | Status: DC | PRN
  Administered 2020-01-22: 25 ug via INTRAVENOUS
  Administered 2020-01-22: 50 ug via INTRAVENOUS
  Administered 2020-01-22: 25 ug via INTRAVENOUS
  Administered 2020-01-22: 50 ug via INTRAVENOUS
  Filled 2020-01-22: qty 1

## 2020-01-22 MED ORDER — MIDAZOLAM HCL 2 MG/2ML IJ SOLN
INTRAMUSCULAR | Status: AC
  Filled 2020-01-22: qty 2

## 2020-01-22 MED ORDER — CEFAZOLIN SODIUM-DEXTROSE 2-4 GM/100ML-% IV SOLN
2.0000 g | INTRAVENOUS | Status: AC
  Administered 2020-01-22: 2 g via INTRAVENOUS
  Filled 2020-01-22: qty 100

## 2020-01-22 MED ORDER — MIDAZOLAM HCL 5 MG/5ML IJ SOLN
INTRAMUSCULAR | Status: DC | PRN
  Administered 2020-01-22: 2 mg via INTRAVENOUS

## 2020-01-22 MED ORDER — OXYCODONE HCL 5 MG PO TABS
ORAL_TABLET | ORAL | Status: AC
  Filled 2020-01-22: qty 2

## 2020-01-22 MED ORDER — MENTHOL 3 MG MT LOZG
1.0000 | LOZENGE | OROMUCOSAL | Status: DC | PRN
  Filled 2020-01-22: qty 9

## 2020-01-22 MED ORDER — DEXAMETHASONE SODIUM PHOSPHATE 10 MG/ML IJ SOLN
INTRAMUSCULAR | Status: DC | PRN
  Administered 2020-01-22: 10 mg via INTRAVENOUS

## 2020-01-22 MED ORDER — LIDOCAINE 2% (20 MG/ML) 5 ML SYRINGE
INTRAMUSCULAR | Status: AC
  Filled 2020-01-22: qty 5

## 2020-01-22 MED ORDER — ROCURONIUM BROMIDE 10 MG/ML (PF) SYRINGE
PREFILLED_SYRINGE | INTRAVENOUS | Status: AC
  Filled 2020-01-22: qty 10

## 2020-01-22 MED ORDER — DEXAMETHASONE SODIUM PHOSPHATE 10 MG/ML IJ SOLN
INTRAMUSCULAR | Status: AC
  Filled 2020-01-22: qty 1

## 2020-01-22 MED ORDER — PROPOFOL 10 MG/ML IV BOLUS
INTRAVENOUS | Status: DC | PRN
  Administered 2020-01-22: 180 mg via INTRAVENOUS

## 2020-01-22 MED ORDER — ONDANSETRON HCL 4 MG/2ML IJ SOLN
INTRAMUSCULAR | Status: DC | PRN
  Administered 2020-01-22: 4 mg via INTRAVENOUS

## 2020-01-22 MED ORDER — LACTATED RINGERS IV SOLN
INTRAVENOUS | Status: DC
  Administered 2020-01-22: 1000 mL via INTRAVENOUS
  Filled 2020-01-22: qty 1000

## 2020-01-22 MED ORDER — 0.9 % SODIUM CHLORIDE (POUR BTL) OPTIME
TOPICAL | Status: DC | PRN
  Administered 2020-01-22: 500 mL

## 2020-01-22 MED ORDER — KETAMINE HCL 10 MG/ML IJ SOLN
INTRAMUSCULAR | Status: DC | PRN
  Administered 2020-01-22: 25 mg via INTRAVENOUS

## 2020-01-22 MED ORDER — CEFAZOLIN SODIUM-DEXTROSE 2-4 GM/100ML-% IV SOLN
INTRAVENOUS | Status: AC
  Filled 2020-01-22: qty 100

## 2020-01-22 MED ORDER — LIDOCAINE 2% (20 MG/ML) 5 ML SYRINGE
INTRAMUSCULAR | Status: DC | PRN
  Administered 2020-01-22: 80 mg via INTRAVENOUS

## 2020-01-22 MED ORDER — FENTANYL CITRATE (PF) 250 MCG/5ML IJ SOLN
INTRAMUSCULAR | Status: AC
  Filled 2020-01-22: qty 5

## 2020-01-22 MED ORDER — SODIUM CHLORIDE 0.9 % IR SOLN
Status: DC | PRN
  Administered 2020-01-22 (×2): 1000 mL

## 2020-01-22 MED ORDER — SENNA 8.6 MG PO TABS
ORAL_TABLET | ORAL | Status: AC
  Filled 2020-01-22: qty 1

## 2020-01-22 MED ORDER — ROCURONIUM BROMIDE 10 MG/ML (PF) SYRINGE
PREFILLED_SYRINGE | INTRAVENOUS | Status: DC | PRN
  Administered 2020-01-22: 50 mg via INTRAVENOUS
  Administered 2020-01-22: 10 mg via INTRAVENOUS

## 2020-01-22 MED ORDER — IBUPROFEN 800 MG PO TABS
ORAL_TABLET | ORAL | Status: AC
  Filled 2020-01-22: qty 1

## 2020-01-22 MED ORDER — ACETAMINOPHEN 500 MG PO TABS
1000.0000 mg | ORAL_TABLET | ORAL | Status: AC
  Administered 2020-01-22: 1000 mg via ORAL
  Filled 2020-01-22: qty 2

## 2020-01-22 MED ORDER — FENTANYL CITRATE (PF) 100 MCG/2ML IJ SOLN
INTRAMUSCULAR | Status: AC
  Filled 2020-01-22: qty 2

## 2020-01-22 MED ORDER — PHENYLEPHRINE 40 MCG/ML (10ML) SYRINGE FOR IV PUSH (FOR BLOOD PRESSURE SUPPORT)
PREFILLED_SYRINGE | INTRAVENOUS | Status: DC | PRN
  Administered 2020-01-22 (×2): 80 ug via INTRAVENOUS

## 2020-01-22 MED ORDER — OXYCODONE HCL 5 MG PO TABS
5.0000 mg | ORAL_TABLET | ORAL | Status: DC | PRN
  Administered 2020-01-22: 5 mg via ORAL
  Administered 2020-01-22: 10 mg via ORAL
  Administered 2020-01-22: 5 mg via ORAL
  Filled 2020-01-22: qty 2

## 2020-01-22 MED ORDER — ONDANSETRON HCL 4 MG/2ML IJ SOLN
4.0000 mg | Freq: Four times a day (QID) | INTRAMUSCULAR | Status: DC | PRN
  Administered 2020-01-22: 4 mg via INTRAVENOUS
  Filled 2020-01-22: qty 2

## 2020-01-22 MED ORDER — FENTANYL CITRATE (PF) 100 MCG/2ML IJ SOLN
INTRAMUSCULAR | Status: DC | PRN
  Administered 2020-01-22: 100 ug via INTRAVENOUS
  Administered 2020-01-22: 50 ug via INTRAVENOUS
  Administered 2020-01-22: 100 ug via INTRAVENOUS

## 2020-01-22 MED ORDER — EPHEDRINE SULFATE-NACL 50-0.9 MG/10ML-% IV SOSY
PREFILLED_SYRINGE | INTRAVENOUS | Status: DC | PRN
  Administered 2020-01-22: 10 mg via INTRAVENOUS

## 2020-01-22 MED ORDER — PROPOFOL 10 MG/ML IV BOLUS
INTRAVENOUS | Status: AC
  Filled 2020-01-22: qty 20

## 2020-01-22 MED ORDER — BUPIVACAINE HCL (PF) 0.25 % IJ SOLN
INTRAMUSCULAR | Status: DC | PRN
  Administered 2020-01-22: 10 mL

## 2020-01-22 MED ORDER — ONDANSETRON HCL 4 MG/2ML IJ SOLN
INTRAMUSCULAR | Status: AC
  Filled 2020-01-22: qty 2

## 2020-01-22 MED ORDER — PHENYLEPHRINE 40 MCG/ML (10ML) SYRINGE FOR IV PUSH (FOR BLOOD PRESSURE SUPPORT)
PREFILLED_SYRINGE | INTRAVENOUS | Status: AC
  Filled 2020-01-22: qty 10

## 2020-01-22 MED ORDER — LIDOCAINE 20MG/ML (2%) 15 ML SYRINGE OPTIME
INTRAMUSCULAR | Status: DC | PRN
  Administered 2020-01-22: 1.5 mg/kg/h via INTRAVENOUS

## 2020-01-22 MED ORDER — KETOROLAC TROMETHAMINE 30 MG/ML IJ SOLN
INTRAMUSCULAR | Status: AC
  Filled 2020-01-22: qty 1

## 2020-01-22 MED ORDER — IBUPROFEN 800 MG PO TABS
800.0000 mg | ORAL_TABLET | Freq: Three times a day (TID) | ORAL | Status: DC
  Administered 2020-01-22 – 2020-01-23 (×3): 800 mg via ORAL
  Filled 2020-01-22: qty 1

## 2020-01-22 MED ORDER — WHITE PETROLATUM EX OINT
TOPICAL_OINTMENT | CUTANEOUS | Status: AC
  Filled 2020-01-22: qty 5

## 2020-01-22 MED ORDER — ACETAMINOPHEN 500 MG PO TABS
ORAL_TABLET | ORAL | Status: AC
  Filled 2020-01-22: qty 2

## 2020-01-22 MED ORDER — ONDANSETRON HCL 4 MG PO TABS
4.0000 mg | ORAL_TABLET | Freq: Four times a day (QID) | ORAL | Status: DC | PRN
  Filled 2020-01-22: qty 1

## 2020-01-22 MED ORDER — KETAMINE HCL 10 MG/ML IJ SOLN
INTRAMUSCULAR | Status: AC
  Filled 2020-01-22: qty 1

## 2020-01-22 MED ORDER — LIDOCAINE HCL 2 % IJ SOLN
INTRAMUSCULAR | Status: AC
  Filled 2020-01-22: qty 20

## 2020-01-22 SURGICAL SUPPLY — 53 items
BARRIER ADHS 3X4 INTERCEED (GAUZE/BANDAGES/DRESSINGS) IMPLANT
CABLE HIGH FREQUENCY MONO STRZ (ELECTRODE) IMPLANT
CELL SAVER LIPIGURD (MISCELLANEOUS) IMPLANT
CLOTH BEACON ORANGE TIMEOUT ST (SAFETY) ×4 IMPLANT
COVER BACK TABLE 60X90IN (DRAPES) ×4 IMPLANT
COVER MAYO STAND STRL (DRAPES) ×4 IMPLANT
COVER SURGICAL LIGHT HANDLE (MISCELLANEOUS) ×4 IMPLANT
COVER WAND RF STERILE (DRAPES) ×4 IMPLANT
DERMABOND ADVANCED (GAUZE/BANDAGES/DRESSINGS) ×1
DERMABOND ADVANCED .7 DNX12 (GAUZE/BANDAGES/DRESSINGS) ×3 IMPLANT
DEVICE SUTURE ENDOST 10MM (ENDOMECHANICALS) ×4 IMPLANT
DURAPREP 26ML APPLICATOR (WOUND CARE) ×4 IMPLANT
EXTRT SYSTEM ALEXIS 14CM (MISCELLANEOUS)
EXTRT SYSTEM ALEXIS 17CM (MISCELLANEOUS)
GAUZE 4X4 16PLY RFD (DISPOSABLE) ×4 IMPLANT
GLOVE BIO SURGEON STRL SZ 6.5 (GLOVE) ×8 IMPLANT
GOWN STRL REUS W/TWL LRG LVL3 (GOWN DISPOSABLE) ×8 IMPLANT
HIBICLENS CHG 4% 4OZ (MISCELLANEOUS) ×4 IMPLANT
MANIPULATOR UTERINE 7CM CLEARV (MISCELLANEOUS) IMPLANT
NS IRRIG 1000ML POUR BTL (IV SOLUTION) ×4 IMPLANT
OCCLUDER COLPOPNEUMO (BALLOONS) ×4 IMPLANT
PACK LAPAROSCOPY BASIN (CUSTOM PROCEDURE TRAY) ×4 IMPLANT
PACK TRENDGUARD 450 HYBRID PRO (MISCELLANEOUS) IMPLANT
SCISSORS LAP 5X35 DISP (ENDOMECHANICALS) IMPLANT
SET IRRIG TUBING LAPAROSCOPIC (IRRIGATION / IRRIGATOR) ×4 IMPLANT
SET IRRIG Y TYPE TUR BLADDER L (SET/KITS/TRAYS/PACK) IMPLANT
SET TRI-LUMEN FLTR TB AIRSEAL (TUBING) ×4 IMPLANT
SET TUBE SMOKE EVAC HIGH FLOW (TUBING) IMPLANT
SHEARS HARMONIC ACE PLUS 36CM (ENDOMECHANICALS) ×4 IMPLANT
SUT ENDO VLOC 180-0-8IN (SUTURE) ×4 IMPLANT
SUT PLAIN 2 0 XLH (SUTURE) IMPLANT
SUT VIC AB 0 CT1 27 (SUTURE) ×8
SUT VIC AB 0 CT1 27XBRD ANBCTR (SUTURE) ×6 IMPLANT
SUT VIC AB 2-0 CT1 (SUTURE) IMPLANT
SUT VIC AB 4-0 KS 27 (SUTURE) IMPLANT
SUT VIC AB 4-0 PS2 27 (SUTURE) ×4 IMPLANT
SUT VICRYL 0 UR6 27IN ABS (SUTURE) ×4 IMPLANT
SUT VICRYL 4-0 PS2 18IN ABS (SUTURE) ×4 IMPLANT
SYR 10ML LL (SYRINGE) IMPLANT
SYR 50ML LL SCALE MARK (SYRINGE) ×4 IMPLANT
SYSTEM CARTER THOMASON II (TROCAR) ×4 IMPLANT
SYSTEM CONTND EXTRCTN KII BLLN (MISCELLANEOUS) IMPLANT
TIP UTERINE 5.1X6CM LAV DISP (MISCELLANEOUS) IMPLANT
TIP UTERINE 6.7X10CM GRN DISP (MISCELLANEOUS) IMPLANT
TIP UTERINE 6.7X6CM WHT DISP (MISCELLANEOUS) IMPLANT
TIP UTERINE 6.7X8CM BLUE DISP (MISCELLANEOUS) ×4 IMPLANT
TOWEL OR 17X26 10 PK STRL BLUE (TOWEL DISPOSABLE) ×8 IMPLANT
TRAY FOLEY W/BAG SLVR 14FR (SET/KITS/TRAYS/PACK) ×4 IMPLANT
TRENDGUARD 450 HYBRID PRO PACK (MISCELLANEOUS)
TROCAR BLADELESS OPT 5 100 (ENDOMECHANICALS) ×12 IMPLANT
TROCAR PORT AIRSEAL 5X120 (TROCAR) IMPLANT
TROCAR XCEL NON-BLD 11X100MML (ENDOMECHANICALS) ×4 IMPLANT
WARMER LAPAROSCOPE (MISCELLANEOUS) ×4 IMPLANT

## 2020-01-22 NOTE — Anesthesia Procedure Notes (Signed)
Procedure Name: Intubation Date/Time: 01/22/2020 8:09 AM Performed by: Ceriah Kohler D, CRNA Pre-anesthesia Checklist: Patient identified, Emergency Drugs available, Suction available and Patient being monitored Patient Re-evaluated:Patient Re-evaluated prior to induction Oxygen Delivery Method: Circle system utilized Preoxygenation: Pre-oxygenation with 100% oxygen Induction Type: IV induction Ventilation: Mask ventilation without difficulty Laryngoscope Size: Mac and 3 Grade View: Grade I Tube type: Oral Tube size: 7.0 mm Number of attempts: 1 Airway Equipment and Method: Stylet and Oral airway Placement Confirmation: ETT inserted through vocal cords under direct vision,  positive ETCO2 and breath sounds checked- equal and bilateral Secured at: 21 cm Tube secured with: Tape Dental Injury: Teeth and Oropharynx as per pre-operative assessment

## 2020-01-22 NOTE — Op Note (Signed)
Operative Note    Preoperative Diagnosis: Menometrorrhagia                                             Dysmenorrhea                                              Chronic pelvic pain                                              Family history of ovarian cancer                                                 Postoperative Diagnosis: Same                                               Intraabdominal adhesions   Procedure: Total laparoscopic hysterectomy, bilateral salpingo- oopherectomy, lysis of adhesions, cystoscopy   Surgeon: Mickle Mallory DO Assist: Kandis Cocking MD  Anesthesia: General  Fluids: LR 1269ml EBL: 335ml UOP: 1112ml  Findings: Grossly normal uterus, tubes ( hx bTL) and ovaries. Intraabdominal adhesions of omentum to anterior abdominal wall   Specimen: Uterus, cervix, bilateral fallopian tubes and ovaries   Procedure Note  Pt seen in pre-op. Procedure reviewed  and all questions answered. Pt requested bilateral instead of unilateral oopherectomy. Reiterated risks/benefits. Consent verified  Pt taken to operating room and placed in dorsal lithotomy position with her arms safely positioned at her sides. General anesthesia was administered and found to be adequate. Pt was prepped and draped in sterile fashion and a timeout performed. A weighted speculum was placed in the posterior fornix and a sim retractor placed anteriorly. Excellent visualization of the cervix was obtained. Uterus was sounded to 9cm so a size 8 koh was assembled with a small cup and placed with retention sutures at 3 and 9 o'clock. A foley catheter was also placed in a sterile fashion Legs were then lowered and attention turned to her abdomen.  Here a 19mm incision was made at the umbilicus. A 61mm optiview trocar was then placed with the abdomen tented upwards. The laparoscopic camera was used to confirm placement and pneumoperitoneum obtained with CO2 gas to 62mmHg. The patient was placed in trendelenberg  and gross survey of pelvis done.The uterus was noted as described above:  At this time one more 63mm port was then placed under direct visualization in right lower quadrant and an 11 site in the left with care taken to avoid the epigastric vessels. Further exploration noted  Both ureters were visualized along lateral sidewalls.  Starting on the the patients left, the left fallopian tube was then grasped with a blunt grasper, elevated and excised using the harmonic hemostatically. Next the utero-ovarian ligament and the round ligaments were sequentially grasped and excised. The broad ligament was then separated from the uterus as well with  a bladder flap created. The cardinal ligament was then excised next at the utero-cervical junction and the ovarian vessel clamped, cauterized and cut. The same was done on the patients right. There was moderate brisk bleeding noted from the ovarian vessels as ligated. This was stopped with cautery. Great hemostasis noted.  Next the bladder reflection was dissected away. The vaginal occluder was filled with 60cc of saline and starting anteriorly and working laterally the uterus and cervix were amputated off the superior vagina. The uterus, cervix and fallopian tubes were removed.  The pelvis was irrigated and hemostasis again apprreciated. The angles of the vaginal cuff were easily seen and using the endostitch with an 0 vicryl v-lock suture, the cuff was closed in a running fashion with 3-4 back stitches to ensure closure.  Both ureters were easily visualized away from sites of cautery/surgery.  At this point a cystoscopy was performed using a 30degree scope. Jets were easily noted from both ostia.  Further irrigation of the pelvis confirmed no abnormalities or bleeding hence patient was flattened. The left and right  port sites were closed with  deep stitches using a U shaped needle after all of the trocars were removed under direct visualization and gas allowed to  escape.  Incision sites were closed with 4-0 vicryl suture and dermabond. Counts were noted to be correct. Patient was awakened and taken to recovery room in stable status.  Foley was left in place

## 2020-01-22 NOTE — Interval H&P Note (Signed)
History and Physical Interval Note: Pt seen. She expresses desire to change procedure as follows: change from unilateral oopherectomy to bilateral oopherectomy given family history of cancer. Pt expresses understanding of risks of menopausal symptoms post operatively as discussed in all sessions prior to today and reiterated today. She would fel more at ease with medical management of menopausal symptoms than retaining one ovary.  Consent changed to reflect pt request To OR when ready  01/22/2020 8:02 AM  Valerie Cortez  has presented today for surgery, with the diagnosis of IRREGULAR PERIODS.  The various methods of treatment have been discussed with the patient and family. After consideration of risks, benefits and other options for treatment, the patient has consented to  Procedure(s): TOTAL LAPAROSCOPIC HYSTERECTOMY, BILATERAL SALPINGECTOMY, (LEFT/RIGHT) OOPHORECTOMY, (N/A) CYSTOSCOPY (N/A) as a surgical intervention.  The patient's history has been reviewed, patient examined, no change in status, stable for surgery.  I have reviewed the patient's chart and labs.  Questions were answered to the patient's satisfaction.     Cathrine Muster

## 2020-01-22 NOTE — Progress Notes (Signed)
Pt doing well now s/p percocet x 2. Denies hives or rash Sitting in chair at beside - eating lunch Pain well controlled. Has no complaints  VSS  GEN - NAD  A/P: POD#0 s/p tlh/bso, cystoscopy         Reviewed procedure and recovery expectations once again         Gas relief prn         Plan to remove foley at 6pm         Likely discharge home tomorrow         Routine pot op care

## 2020-01-22 NOTE — Transfer of Care (Signed)
Immediate Anesthesia Transfer of Care Note  Patient: Mahari Vankirk Shoaf  Procedure(s) Performed: TOTAL LAPAROSCOPIC HYSTERECTOMY, BILATERAL SALPINGOOPHORECTOMY (N/A Abdomen) CYSTOSCOPY (N/A Bladder) LAPAROSCOPIC LYSIS OF ADHESIONS (N/A Abdomen)  Patient Location: PACU  Anesthesia Type:General  Level of Consciousness: awake, alert  and oriented  Airway & Oxygen Therapy: Patient Spontanous Breathing and Patient connected to nasal cannula oxygen  Post-op Assessment: Report given to RN and Post -op Vital signs reviewed and stable  Post vital signs: Reviewed and stable  Last Vitals:  Vitals Value Taken Time  BP    Temp    Pulse 94 01/22/20 1022  Resp 19 01/22/20 1022  SpO2 100 % 01/22/20 1022  Vitals shown include unvalidated device data.  Last Pain:  Vitals:   01/22/20 0647  TempSrc: Oral  PainSc: 3       Patients Stated Pain Goal: 8 (01/22/20 5056)  Complications: No apparent anesthesia complications

## 2020-01-22 NOTE — Progress Notes (Signed)
Patient c/o itching to area between breasts and upper back below neck. Erythema noted to areas but no visible rash. Also c/o generalized itching. Receiving prn oxycodone prn pain per MD order as previously discussed with MD. MD notified, new orders obtained for vistaril prn d/t allergy documented to benadryl. Will continue to monitor. VSS.

## 2020-01-22 NOTE — Progress Notes (Signed)
01/22/2020 12:30 PM Nursing note Dr. Mindi Slicker called and made aware pt. C/o severe post op pain. Scheduled Ibuprofen given per orders. Pt. Concerned about taking Oxy IR due to prior hives with this medication. MD made aware. Verbal order received ok for patient to take Oxy IR as scheduled and call MD if any further issues arise. Verbal order also received ok to d/c Foley catheter at 6pm tonight. Pt. Updated on plan of care. Will continue to closely monitor patient.  Valerie Cortez, Blanchard Kelch

## 2020-01-23 DIAGNOSIS — Z8709 Personal history of other diseases of the respiratory system: Secondary | ICD-10-CM | POA: Diagnosis not present

## 2020-01-23 DIAGNOSIS — Z8701 Personal history of pneumonia (recurrent): Secondary | ICD-10-CM | POA: Diagnosis not present

## 2020-01-23 DIAGNOSIS — Z8041 Family history of malignant neoplasm of ovary: Secondary | ICD-10-CM | POA: Diagnosis not present

## 2020-01-23 DIAGNOSIS — Z888 Allergy status to other drugs, medicaments and biological substances status: Secondary | ICD-10-CM | POA: Diagnosis not present

## 2020-01-23 DIAGNOSIS — Z885 Allergy status to narcotic agent status: Secondary | ICD-10-CM | POA: Diagnosis not present

## 2020-01-23 DIAGNOSIS — N921 Excessive and frequent menstruation with irregular cycle: Secondary | ICD-10-CM | POA: Diagnosis not present

## 2020-01-23 DIAGNOSIS — E282 Polycystic ovarian syndrome: Secondary | ICD-10-CM | POA: Diagnosis not present

## 2020-01-23 DIAGNOSIS — K660 Peritoneal adhesions (postprocedural) (postinfection): Secondary | ICD-10-CM | POA: Diagnosis not present

## 2020-01-23 DIAGNOSIS — K219 Gastro-esophageal reflux disease without esophagitis: Secondary | ICD-10-CM | POA: Diagnosis not present

## 2020-01-23 DIAGNOSIS — Z91013 Allergy to seafood: Secondary | ICD-10-CM | POA: Diagnosis not present

## 2020-01-23 DIAGNOSIS — N946 Dysmenorrhea, unspecified: Secondary | ICD-10-CM | POA: Diagnosis not present

## 2020-01-23 DIAGNOSIS — G8929 Other chronic pain: Secondary | ICD-10-CM | POA: Diagnosis not present

## 2020-01-23 DIAGNOSIS — Z7984 Long term (current) use of oral hypoglycemic drugs: Secondary | ICD-10-CM | POA: Diagnosis not present

## 2020-01-23 DIAGNOSIS — Z9102 Food additives allergy status: Secondary | ICD-10-CM | POA: Diagnosis not present

## 2020-01-23 LAB — SURGICAL PATHOLOGY

## 2020-01-23 MED ORDER — OXYCODONE HCL 5 MG PO TABS: 5.0000 mg | ORAL_TABLET | ORAL | 0 refills | Status: AC | PRN

## 2020-01-23 MED ORDER — IBUPROFEN 800 MG PO TABS
ORAL_TABLET | ORAL | Status: AC
  Filled 2020-01-23: qty 1

## 2020-01-23 MED ORDER — HYDROXYZINE HCL 25 MG PO TABS: 25.0000 mg | ORAL_TABLET | Freq: Four times a day (QID) | ORAL | 1 refills | Status: AC | PRN

## 2020-01-23 MED ORDER — IBUPROFEN 600 MG PO TABS: 600.0000 mg | ORAL_TABLET | Freq: Four times a day (QID) | ORAL | 1 refills | Status: AC | PRN

## 2020-01-23 MED ORDER — BUPROPION HCL ER (SR) 150 MG PO TB12: 150.0000 mg | ORAL_TABLET | Freq: Two times a day (BID) | ORAL | 2 refills | Status: AC

## 2020-01-23 NOTE — Discharge Summary (Signed)
Physician Discharge Summary  Patient ID: Valerie Cortez MRN: [id number] DOB/AGE: 1991-05-07 29 y.o.  Admit date: 01/22/2020 Discharge date: 01/23/2020  Admission Diagnoses:  Discharge Diagnoses:  Active Problems:   Menometrorrhagia   S/P laparoscopic hysterectomy   S/P BSO (status post bilateral salpingo-oophorectomy)   Discharged Condition: stable  Hospital Course: Pt recovered well overnight. Pain well control. Stable for discharge on POD#1  Consults: None  Significant Diagnostic Studies: labs: stable  Treatments: analgesia: acetaminophen w/ codeine and ibuprofen and surgery: TLH/BSO, cystoscopy  Discharge Exam: Blood pressure 103/60, pulse (!) 57, temperature 98 F (36.7 C), temperature source Oral, resp. rate 16, height 5\' 3"  (1.6 m), weight 81 kg, last menstrual period 12/23/2019, SpO2 98 %, unknown if currently breastfeeding. General appearance: alert, cooperative and no distress Incision/Wound:  Disposition: Discharge disposition: 01-Home or Self Care       Discharge Instructions    Call MD for:  difficulty breathing, headache or visual disturbances   Complete by: As directed    Call MD for:  persistant nausea and vomiting   Complete by: As directed    Call MD for:  redness, tenderness, or signs of infection (pain, swelling, redness, odor or green/yellow discharge around incision site)   Complete by: As directed    Call MD for:  severe uncontrolled pain   Complete by: As directed    Call MD for:  temperature >100.4   Complete by: As directed    Diet - low sodium heart healthy   Complete by: As directed    Increase activity slowly   Complete by: As directed      Allergies as of 01/23/2020      Reactions   Fentanyl Itching, Rash, Other (See Comments)   "flushed" feeling   Benadryl [diphenhydramine Hcl] Other (See Comments)   "wakes me up"   Peanut-containing Drug Products Hives   Red Dye Nausea And Vomiting, Other (See Comments)   Red food coloring   Shrimp [shellfish Allergy]    Oxycodone Hives, Rash      Medication List    STOP taking these medications   metFORMIN 500 MG 24 hr tablet Commonly known as: GLUCOPHAGE-XR     TAKE these medications   buPROPion 150 MG 12 hr tablet Commonly known as: Wellbutrin SR Take 1 tablet (150 mg total) by mouth 2 (two) times daily. Take one tab po daily x 1 week then twice daily   hydrOXYzine 25 MG tablet Commonly known as: ATARAX/VISTARIL Take 1 tablet (25 mg total) by mouth every 6 (six) hours as needed for itching.   ibuprofen 600 MG tablet Commonly known as: ADVIL Take 1 tablet (600 mg total) by mouth every 6 (six) hours as needed for cramping. What changed:   when to take this  reasons to take this  Another medication with the same name was removed. Continue taking this medication, and follow the directions you see here.   Nasacort Allergy 24HR 55 MCG/ACT Aero nasal inhaler Generic drug: triamcinolone Place 2 sprays into the nose daily.   omeprazole 20 MG capsule Commonly known as: PRILOSEC Take 20 mg by mouth daily as needed. Used dissolvable rather than tablet   oxyCODONE 5 MG immediate release tablet Commonly known as: Oxy IR/ROXICODONE Take 1 tablet (5 mg total) by mouth every 4 (four) hours as needed for moderate pain or severe pain.      Follow-up Information    Sherlyn Hay, DO. Schedule an appointment as soon as possible for a visit  in 2 week(s).   Specialty: Obstetrics and Gynecology Why: For post op incision check and 6 weeks for post op visit Contact information: [address] [place] [address] [phone]           Signed: Cathrine Muster 01/23/2020, 9:06 AM

## 2020-01-23 NOTE — Anesthesia Postprocedure Evaluation (Signed)
Anesthesia Post Note  Patient: Valerie Cortez  Procedure(s) Performed: TOTAL LAPAROSCOPIC HYSTERECTOMY, BILATERAL SALPINGOOPHORECTOMY (N/A Abdomen) CYSTOSCOPY (N/A Bladder) LAPAROSCOPIC LYSIS OF ADHESIONS (N/A Abdomen)     Patient location during evaluation: PACU Anesthesia Type: General Level of consciousness: awake Pain management: pain level controlled Vital Signs Assessment: post-procedure vital signs reviewed and stable Respiratory status: spontaneous breathing Cardiovascular status: stable Anesthetic complications: no    Last Vitals:  Vitals:   01/23/20 0620 01/23/20 0910  BP: 103/60 98/63  Pulse: (!) 57 (!) 59  Resp: 16 15  Temp: 36.7 C 36.6 C  SpO2: 98% 100%    Last Pain:  Vitals:   01/23/20 0910  TempSrc:   PainSc: 2                  [name]

## 2020-01-23 NOTE — Discharge Instructions (Signed)

## 2020-01-23 NOTE — Progress Notes (Signed)
1 Day Post-Op Procedure(s) (LRB): TOTAL LAPAROSCOPIC HYSTERECTOMY, BILATERAL SALPINGOOPHORECTOMY (N/A) CYSTOSCOPY (N/A) LAPAROSCOPIC LYSIS OF ADHESIONS (N/A)  Subjective: Patient reports tolerating PO, + flatus and no problems voiding.  Pain well controlled; vistaril helped with itching overnight. Has ambulated with no complaints. Denies fever, chills, hot flushes, mood liability. In good spirits. Ready for discharge to home today  Objective: I have reviewed patient's vital signs, intake and output, medications and labs.  General: alert, cooperative and no distress GI: incision: clean, dry and intact Extremities: extremities normal, atraumatic, no cyanosis or edema Vaginal Bleeding: minimal  Assessment: s/p Procedure(s): TOTAL LAPAROSCOPIC HYSTERECTOMY, BILATERAL SALPINGOOPHORECTOMY (N/A) CYSTOSCOPY (N/A) LAPAROSCOPIC LYSIS OF ADHESIONS (N/A): stable  Plan: Discharge home  Instructions reviewed Rx for ibuprofen. Percocet, vistaril and wellbutrin sent  LOS: 0 days    Jamil Castillo W Terrisa Curfman 01/23/2020, 8:56 AM

## 2020-02-05 DIAGNOSIS — N898 Other specified noninflammatory disorders of vagina: Secondary | ICD-10-CM | POA: Diagnosis not present

## 2020-05-21 DIAGNOSIS — F341 Dysthymic disorder: Secondary | ICD-10-CM | POA: Diagnosis not present

## 2020-07-03 DIAGNOSIS — Z6834 Body mass index (BMI) 34.0-34.9, adult: Secondary | ICD-10-CM | POA: Diagnosis not present

## 2020-07-03 DIAGNOSIS — Z713 Dietary counseling and surveillance: Secondary | ICD-10-CM | POA: Diagnosis not present

## 2020-07-03 DIAGNOSIS — Z0000 Encounter for general adult medical examination without abnormal findings: Secondary | ICD-10-CM | POA: Diagnosis not present

## 2020-08-05 DIAGNOSIS — Z23 Encounter for immunization: Secondary | ICD-10-CM | POA: Diagnosis not present

## 2020-08-05 DIAGNOSIS — Z713 Dietary counseling and surveillance: Secondary | ICD-10-CM | POA: Diagnosis not present

## 2020-08-05 DIAGNOSIS — Z6832 Body mass index (BMI) 32.0-32.9, adult: Secondary | ICD-10-CM | POA: Diagnosis not present

## 2020-09-05 DIAGNOSIS — Z1331 Encounter for screening for depression: Secondary | ICD-10-CM | POA: Diagnosis not present

## 2020-09-05 DIAGNOSIS — Z6831 Body mass index (BMI) 31.0-31.9, adult: Secondary | ICD-10-CM | POA: Diagnosis not present

## 2020-09-05 DIAGNOSIS — Z713 Dietary counseling and surveillance: Secondary | ICD-10-CM | POA: Diagnosis not present

## 2020-10-26 DIAGNOSIS — Z20822 Contact with and (suspected) exposure to covid-19: Secondary | ICD-10-CM | POA: Diagnosis not present

## 2020-11-04 DIAGNOSIS — Z111 Encounter for screening for respiratory tuberculosis: Secondary | ICD-10-CM | POA: Diagnosis not present

## 2020-11-06 DIAGNOSIS — Z0000 Encounter for general adult medical examination without abnormal findings: Secondary | ICD-10-CM | POA: Diagnosis not present

## 2020-11-17 DIAGNOSIS — Z20822 Contact with and (suspected) exposure to covid-19: Secondary | ICD-10-CM | POA: Diagnosis not present

## 2021-01-09 DIAGNOSIS — J209 Acute bronchitis, unspecified: Secondary | ICD-10-CM | POA: Diagnosis not present

## 2021-01-09 DIAGNOSIS — Z6830 Body mass index (BMI) 30.0-30.9, adult: Secondary | ICD-10-CM | POA: Diagnosis not present

## 2021-01-09 DIAGNOSIS — U099 Post covid-19 condition, unspecified: Secondary | ICD-10-CM | POA: Diagnosis not present

## 2022-02-22 DIAGNOSIS — J019 Acute sinusitis, unspecified: Secondary | ICD-10-CM | POA: Diagnosis not present

## 2022-09-04 DIAGNOSIS — J019 Acute sinusitis, unspecified: Secondary | ICD-10-CM | POA: Diagnosis not present

## 2022-09-13 DIAGNOSIS — R112 Nausea with vomiting, unspecified: Secondary | ICD-10-CM | POA: Diagnosis not present

## 2022-09-13 DIAGNOSIS — R1010 Upper abdominal pain, unspecified: Secondary | ICD-10-CM | POA: Diagnosis not present

## 2022-09-13 DIAGNOSIS — M549 Dorsalgia, unspecified: Secondary | ICD-10-CM | POA: Diagnosis not present

## 2022-09-13 DIAGNOSIS — R748 Abnormal levels of other serum enzymes: Secondary | ICD-10-CM | POA: Diagnosis not present

## 2022-09-13 DIAGNOSIS — Z888 Allergy status to other drugs, medicaments and biological substances status: Secondary | ICD-10-CM | POA: Diagnosis not present

## 2022-09-13 DIAGNOSIS — R1013 Epigastric pain: Secondary | ICD-10-CM | POA: Diagnosis not present

## 2022-09-13 DIAGNOSIS — Z885 Allergy status to narcotic agent status: Secondary | ICD-10-CM | POA: Diagnosis not present

## 2022-10-05 DIAGNOSIS — J180 Bronchopneumonia, unspecified organism: Secondary | ICD-10-CM | POA: Diagnosis not present

## 2023-02-09 DIAGNOSIS — J019 Acute sinusitis, unspecified: Secondary | ICD-10-CM | POA: Diagnosis not present

## 2023-02-09 DIAGNOSIS — J040 Acute laryngitis: Secondary | ICD-10-CM | POA: Diagnosis not present

## 2023-11-19 DIAGNOSIS — J019 Acute sinusitis, unspecified: Secondary | ICD-10-CM | POA: Diagnosis not present

## 2024-04-12 DIAGNOSIS — Z885 Allergy status to narcotic agent status: Secondary | ICD-10-CM | POA: Diagnosis not present

## 2024-04-12 DIAGNOSIS — R197 Diarrhea, unspecified: Secondary | ICD-10-CM | POA: Diagnosis not present

## 2024-04-12 DIAGNOSIS — Z91010 Allergy to peanuts: Secondary | ICD-10-CM | POA: Diagnosis not present

## 2024-04-12 DIAGNOSIS — K5090 Crohn's disease, unspecified, without complications: Secondary | ICD-10-CM | POA: Diagnosis not present

## 2024-04-12 DIAGNOSIS — R1032 Left lower quadrant pain: Secondary | ICD-10-CM | POA: Diagnosis not present

## 2024-04-12 DIAGNOSIS — K5732 Diverticulitis of large intestine without perforation or abscess without bleeding: Secondary | ICD-10-CM | POA: Diagnosis not present

## 2024-04-12 DIAGNOSIS — R109 Unspecified abdominal pain: Secondary | ICD-10-CM | POA: Diagnosis not present

## 2024-04-16 DIAGNOSIS — E8721 Acute metabolic acidosis: Secondary | ICD-10-CM | POA: Diagnosis not present

## 2024-04-16 DIAGNOSIS — R000 Tachycardia, unspecified: Secondary | ICD-10-CM | POA: Diagnosis not present

## 2024-04-16 DIAGNOSIS — Z6833 Body mass index (BMI) 33.0-33.9, adult: Secondary | ICD-10-CM | POA: Diagnosis not present

## 2024-04-16 DIAGNOSIS — K5790 Diverticulosis of intestine, part unspecified, without perforation or abscess without bleeding: Secondary | ICD-10-CM | POA: Diagnosis not present

## 2024-04-16 DIAGNOSIS — E559 Vitamin D deficiency, unspecified: Secondary | ICD-10-CM | POA: Diagnosis not present

## 2024-04-16 DIAGNOSIS — Z79899 Other long term (current) drug therapy: Secondary | ICD-10-CM | POA: Diagnosis not present

## 2024-04-16 DIAGNOSIS — Z885 Allergy status to narcotic agent status: Secondary | ICD-10-CM | POA: Diagnosis not present

## 2024-04-16 DIAGNOSIS — E876 Hypokalemia: Secondary | ICD-10-CM | POA: Diagnosis not present

## 2024-04-16 DIAGNOSIS — E669 Obesity, unspecified: Secondary | ICD-10-CM | POA: Diagnosis not present

## 2024-04-16 DIAGNOSIS — D72829 Elevated white blood cell count, unspecified: Secondary | ICD-10-CM | POA: Diagnosis not present

## 2024-04-16 DIAGNOSIS — R4182 Altered mental status, unspecified: Secondary | ICD-10-CM | POA: Diagnosis not present

## 2024-04-16 DIAGNOSIS — B372 Candidiasis of skin and nail: Secondary | ICD-10-CM | POA: Diagnosis not present

## 2024-04-16 DIAGNOSIS — R509 Fever, unspecified: Secondary | ICD-10-CM | POA: Diagnosis not present

## 2024-04-16 DIAGNOSIS — R7401 Elevation of levels of liver transaminase levels: Secondary | ICD-10-CM | POA: Diagnosis not present

## 2024-04-16 DIAGNOSIS — R569 Unspecified convulsions: Secondary | ICD-10-CM | POA: Diagnosis not present

## 2024-04-16 DIAGNOSIS — K5792 Diverticulitis of intestine, part unspecified, without perforation or abscess without bleeding: Secondary | ICD-10-CM | POA: Diagnosis not present

## 2024-04-16 DIAGNOSIS — Z1152 Encounter for screening for COVID-19: Secondary | ICD-10-CM | POA: Diagnosis not present

## 2024-04-16 DIAGNOSIS — Z91013 Allergy to seafood: Secondary | ICD-10-CM | POA: Diagnosis not present

## 2024-04-16 DIAGNOSIS — Z91010 Allergy to peanuts: Secondary | ICD-10-CM | POA: Diagnosis not present

## 2024-04-16 DIAGNOSIS — A419 Sepsis, unspecified organism: Secondary | ICD-10-CM | POA: Diagnosis not present

## 2024-04-16 DIAGNOSIS — G4089 Other seizures: Secondary | ICD-10-CM | POA: Diagnosis not present

## 2024-04-16 DIAGNOSIS — Z9102 Food additives allergy status: Secondary | ICD-10-CM | POA: Diagnosis not present

## 2024-04-17 DIAGNOSIS — R000 Tachycardia, unspecified: Secondary | ICD-10-CM | POA: Diagnosis not present

## 2024-04-19 DIAGNOSIS — R9431 Abnormal electrocardiogram [ECG] [EKG]: Secondary | ICD-10-CM | POA: Diagnosis not present

## 2024-04-19 DIAGNOSIS — Z79899 Other long term (current) drug therapy: Secondary | ICD-10-CM | POA: Diagnosis not present

## 2024-04-19 DIAGNOSIS — R519 Headache, unspecified: Secondary | ICD-10-CM | POA: Diagnosis not present

## 2024-04-19 DIAGNOSIS — R569 Unspecified convulsions: Secondary | ICD-10-CM | POA: Diagnosis not present

## 2024-04-19 DIAGNOSIS — Y844 Aspiration of fluid as the cause of abnormal reaction of the patient, or of later complication, without mention of misadventure at the time of the procedure: Secondary | ICD-10-CM | POA: Diagnosis not present

## 2024-04-19 DIAGNOSIS — G971 Other reaction to spinal and lumbar puncture: Secondary | ICD-10-CM | POA: Diagnosis not present

## 2024-04-19 DIAGNOSIS — M62838 Other muscle spasm: Secondary | ICD-10-CM | POA: Diagnosis not present

## 2024-04-19 DIAGNOSIS — F419 Anxiety disorder, unspecified: Secondary | ICD-10-CM | POA: Diagnosis not present

## 2024-04-26 DIAGNOSIS — R569 Unspecified convulsions: Secondary | ICD-10-CM | POA: Diagnosis not present

## 2024-04-26 DIAGNOSIS — G44209 Tension-type headache, unspecified, not intractable: Secondary | ICD-10-CM | POA: Diagnosis not present

## 2024-04-26 DIAGNOSIS — F411 Generalized anxiety disorder: Secondary | ICD-10-CM | POA: Diagnosis not present

## 2024-04-26 DIAGNOSIS — Z1331 Encounter for screening for depression: Secondary | ICD-10-CM | POA: Diagnosis not present

## 2024-04-26 DIAGNOSIS — F430 Acute stress reaction: Secondary | ICD-10-CM | POA: Diagnosis not present

## 2024-05-04 ENCOUNTER — Encounter: Payer: Self-pay | Admitting: Internal Medicine

## 2024-05-04 DIAGNOSIS — F445 Conversion disorder with seizures or convulsions: Secondary | ICD-10-CM | POA: Diagnosis not present

## 2024-05-07 DIAGNOSIS — E538 Deficiency of other specified B group vitamins: Secondary | ICD-10-CM | POA: Diagnosis not present

## 2024-05-07 DIAGNOSIS — E559 Vitamin D deficiency, unspecified: Secondary | ICD-10-CM | POA: Diagnosis not present

## 2024-05-07 DIAGNOSIS — Z13220 Encounter for screening for lipoid disorders: Secondary | ICD-10-CM | POA: Diagnosis not present

## 2024-05-07 DIAGNOSIS — R569 Unspecified convulsions: Secondary | ICD-10-CM | POA: Diagnosis not present

## 2024-05-10 DIAGNOSIS — Z1331 Encounter for screening for depression: Secondary | ICD-10-CM | POA: Diagnosis not present

## 2024-05-10 DIAGNOSIS — R569 Unspecified convulsions: Secondary | ICD-10-CM | POA: Diagnosis not present

## 2024-05-10 DIAGNOSIS — E669 Obesity, unspecified: Secondary | ICD-10-CM | POA: Diagnosis not present

## 2024-05-10 DIAGNOSIS — E538 Deficiency of other specified B group vitamins: Secondary | ICD-10-CM | POA: Diagnosis not present

## 2024-05-10 DIAGNOSIS — F411 Generalized anxiety disorder: Secondary | ICD-10-CM | POA: Diagnosis not present

## 2024-05-10 DIAGNOSIS — Z13220 Encounter for screening for lipoid disorders: Secondary | ICD-10-CM | POA: Diagnosis not present

## 2024-05-10 DIAGNOSIS — F430 Acute stress reaction: Secondary | ICD-10-CM | POA: Diagnosis not present

## 2024-05-10 DIAGNOSIS — E559 Vitamin D deficiency, unspecified: Secondary | ICD-10-CM | POA: Diagnosis not present

## 2024-05-10 DIAGNOSIS — R7309 Other abnormal glucose: Secondary | ICD-10-CM | POA: Diagnosis not present

## 2024-08-13 ENCOUNTER — Encounter: Payer: Self-pay | Admitting: Adult Health

## 2024-11-01 DIAGNOSIS — R0989 Other specified symptoms and signs involving the circulatory and respiratory systems: Secondary | ICD-10-CM | POA: Diagnosis not present

## 2024-11-01 DIAGNOSIS — Z885 Allergy status to narcotic agent status: Secondary | ICD-10-CM | POA: Diagnosis not present

## 2024-11-01 DIAGNOSIS — R059 Cough, unspecified: Secondary | ICD-10-CM | POA: Diagnosis not present

## 2024-11-01 DIAGNOSIS — J029 Acute pharyngitis, unspecified: Secondary | ICD-10-CM | POA: Diagnosis not present

## 2024-11-01 DIAGNOSIS — J101 Influenza due to other identified influenza virus with other respiratory manifestations: Secondary | ICD-10-CM | POA: Diagnosis not present
